# Patient Record
Sex: Female | Born: 1990
Health system: Southern US, Community
[De-identification: ages and names within clinical notes are randomized; demographics above are authoritative.]

## PROBLEM LIST (undated history)

## (undated) ENCOUNTER — Inpatient Hospital Stay (HOSPITAL_COMMUNITY): Payer: Self-pay

## (undated) ENCOUNTER — Emergency Department (HOSPITAL_BASED_OUTPATIENT_CLINIC_OR_DEPARTMENT_OTHER): Admission: EM | Payer: 59 | Source: Home / Self Care

## (undated) DIAGNOSIS — G43909 Migraine, unspecified, not intractable, without status migrainosus: Secondary | ICD-10-CM

## (undated) DIAGNOSIS — R569 Unspecified convulsions: Secondary | ICD-10-CM

## (undated) DIAGNOSIS — I517 Cardiomegaly: Secondary | ICD-10-CM

## (undated) DIAGNOSIS — Z8619 Personal history of other infectious and parasitic diseases: Secondary | ICD-10-CM

## (undated) DIAGNOSIS — D649 Anemia, unspecified: Secondary | ICD-10-CM

## (undated) HISTORY — DX: Morbid (severe) obesity due to excess calories: E66.01

## (undated) HISTORY — DX: Personal history of other infectious and parasitic diseases: Z86.19

## (undated) HISTORY — DX: Unspecified convulsions: R56.9

## (undated) HISTORY — DX: Anemia, unspecified: D64.9

---
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
## 2012-01-03 ENCOUNTER — Encounter: Payer: Self-pay | Admitting: Obstetrics and Gynecology

## 2012-01-03 ENCOUNTER — Ambulatory Visit (INDEPENDENT_AMBULATORY_CARE_PROVIDER_SITE_OTHER): Payer: BC Managed Care – PPO | Admitting: Obstetrics and Gynecology

## 2012-01-03 VITALS — BP 120/78 | Resp 20 | Wt 241.0 lb

## 2012-01-03 DIAGNOSIS — Z30017 Encounter for initial prescription of implantable subdermal contraceptive: Secondary | ICD-10-CM

## 2012-01-03 DIAGNOSIS — Z309 Encounter for contraceptive management, unspecified: Secondary | ICD-10-CM

## 2012-01-03 MED ORDER — ETONOGESTREL 68 MG ~~LOC~~ IMPL
68.0000 mg | DRUG_IMPLANT | Freq: Once | SUBCUTANEOUS | Status: AC
  Administered 2012-01-03: 68 mg via SUBCUTANEOUS

## 2012-01-03 NOTE — Progress Notes (Signed)
[address]: Contraception: Nexplanon. UPT: Neg  Insertion Procedure:  Site identified on Right Arm Skin Prep with alcohol. Local anesthesia: Lidocaine 1% infiltrated to insertion site and insertion track of Nexplanon. When skin anesthestized, skin prepped with Betadine swabs x 3. Nexplanon inserted as to manufactures recommendations. Nexplanon rod checked as being present under the skin - palpable Adhesive bandage to insertion site and pressure bandage applied to Right Arm. Patient educated on the location of the device and patient was able to feel the rod.  Nexplanon insertion completed.  Patient has been given advice about menses and cycles with this type of hormonal birth.control. Advised that arm my bruise from the insertion of this device and to leave the pressure bandage on her arm for 24 hrs. Advised that she can return for assist and advise at anytime.  Earl Gala, CNM.

## 2012-01-03 NOTE — Progress Notes (Signed)
LMP: 12/31/11 INSERTION DATE: 01/03/12 REMOVAL DATE: 01/03/15 INSERTION ARM: R arm PALPATED AFTER INSERT: yes IS PT SWITCHING FROM HORMONAL BC: no

## 2012-03-20 ENCOUNTER — Observation Stay (HOSPITAL_COMMUNITY)
Admission: EM | Admit: 2012-03-20 | Discharge: 2012-03-21 | Disposition: A | Discharge status: Home / Self Care | Payer: BC Managed Care – PPO | Attending: General Surgery | Admitting: General Surgery

## 2012-03-20 ENCOUNTER — Encounter (HOSPITAL_COMMUNITY)
Admission: EM | Disposition: A | Discharge status: Home / Self Care | Payer: Self-pay | Source: Home / Self Care | Attending: Emergency Medicine

## 2012-03-20 ENCOUNTER — Emergency Department (HOSPITAL_COMMUNITY): Payer: BC Managed Care – PPO

## 2012-03-20 ENCOUNTER — Observation Stay (HOSPITAL_COMMUNITY): Payer: BC Managed Care – PPO | Admitting: Certified Registered Nurse Anesthetist

## 2012-03-20 ENCOUNTER — Encounter (HOSPITAL_COMMUNITY): Payer: Self-pay | Admitting: Certified Registered Nurse Anesthetist

## 2012-03-20 ENCOUNTER — Encounter (HOSPITAL_COMMUNITY): Payer: Self-pay | Admitting: *Deleted

## 2012-03-20 DIAGNOSIS — D649 Anemia, unspecified: Secondary | ICD-10-CM | POA: Insufficient documentation

## 2012-03-20 DIAGNOSIS — K8000 Calculus of gallbladder with acute cholecystitis without obstruction: Principal | ICD-10-CM | POA: Insufficient documentation

## 2012-03-20 DIAGNOSIS — K8050 Calculus of bile duct without cholangitis or cholecystitis without obstruction: Secondary | ICD-10-CM

## 2012-03-20 DIAGNOSIS — J45909 Unspecified asthma, uncomplicated: Secondary | ICD-10-CM | POA: Insufficient documentation

## 2012-03-20 DIAGNOSIS — K8010 Calculus of gallbladder with chronic cholecystitis without obstruction: Secondary | ICD-10-CM

## 2012-03-20 DIAGNOSIS — Z79899 Other long term (current) drug therapy: Secondary | ICD-10-CM | POA: Insufficient documentation

## 2012-03-20 HISTORY — PX: CHOLECYSTECTOMY: SHX55

## 2012-03-20 LAB — COMPREHENSIVE METABOLIC PANEL
ALT: 14 U/L (ref 0–35)
AST: 19 U/L (ref 0–37)
Alkaline Phosphatase: 95 U/L (ref 39–117)
CO2: 23 mEq/L (ref 19–32)
Chloride: 100 mEq/L (ref 96–112)
Creatinine, Ser: 0.62 mg/dL (ref 0.50–1.10)
GFR calc non Af Amer: >90 mL/min (ref >90)
Potassium: 3.9 mEq/L (ref 3.5–5.1)
Total Bilirubin: 0.1 mg/dL — ABNORMAL LOW (ref 0.3–1.2)

## 2012-03-20 LAB — CBC WITH DIFFERENTIAL/PLATELET
Basophils Absolute: 0 10*3/uL (ref 0.0–0.1)
Basophils Relative: 1 % (ref 0–1)
Eosinophils Absolute: 0.3 10*3/uL (ref 0.0–0.7)
Eosinophils Relative: 3 % (ref 0–5)
HCT: 36.4 % (ref 36.0–46.0)
MCH: 24.3 pg — ABNORMAL LOW (ref 26.0–34.0)
MCHC: 31.9 g/dL (ref 30.0–36.0)
MCV: 76.3 fL — ABNORMAL LOW (ref 78.0–100.0)
Monocytes Absolute: 0.5 10*3/uL (ref 0.1–1.0)
Platelets: 316 10*3/uL (ref 150–400)
RDW: 14.6 % (ref 11.5–15.5)
WBC: 8.8 10*3/uL (ref 4.0–10.5)

## 2012-03-20 LAB — PREGNANCY, URINE: Preg Test, Ur: NEGATIVE

## 2012-03-20 LAB — URINALYSIS, ROUTINE W REFLEX MICROSCOPIC
Leukocytes, UA: NEGATIVE
Nitrite: NEGATIVE
Specific Gravity, Urine: 1.028 (ref 1.005–1.030)
Urobilinogen, UA: 1 mg/dL (ref 0.0–1.0)

## 2012-03-20 LAB — SURGICAL PCR SCREEN: MRSA, PCR: NEGATIVE

## 2012-03-20 IMAGING — US US ABDOMEN COMPLETE
1 series · 14 of 25 positions shown · non-contrast
Comparison: None.

CLINICAL DATA: Right upper quadrant pain.

COMPLETE ABDOMINAL ULTRASOUND

[Series 1: us abdomen complete · 0.34mm/px · 14 of 56 slices shown]
[im 1/56]
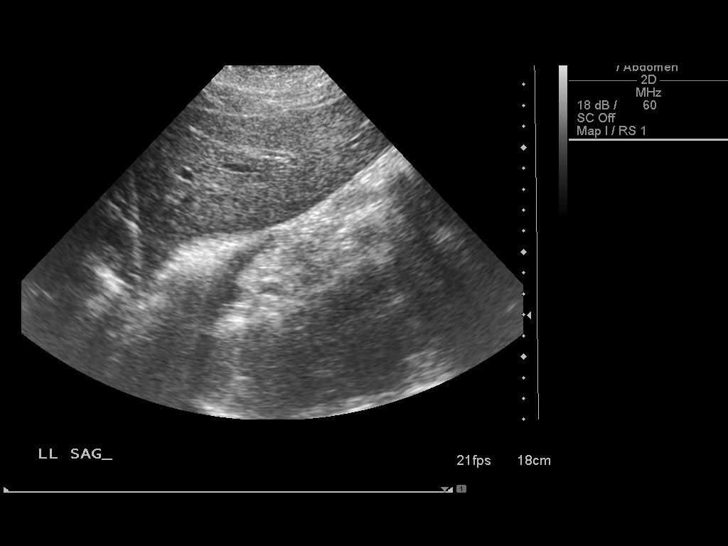
[im 5/56]
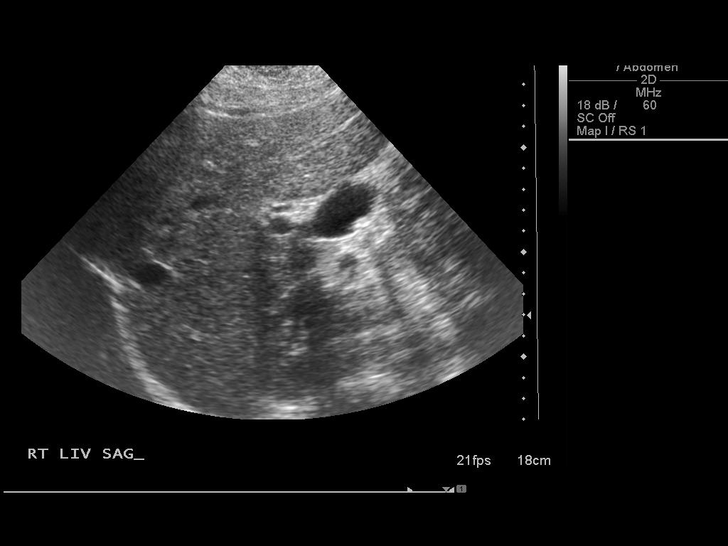
[im 10/56]
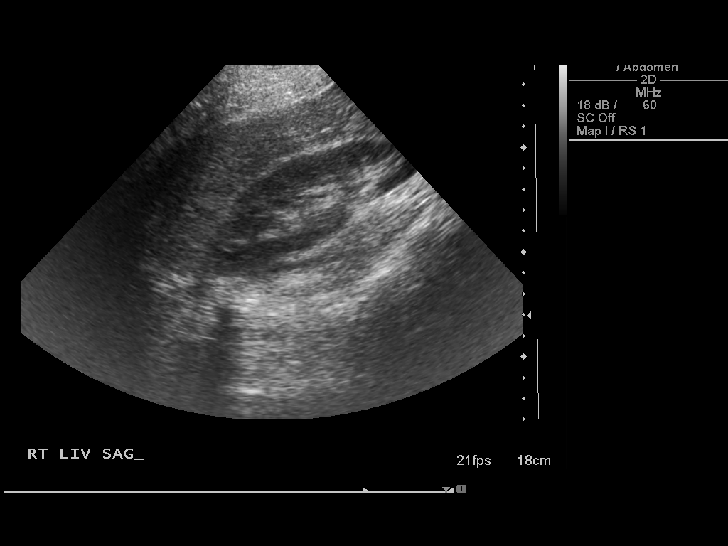
[im 14/56]
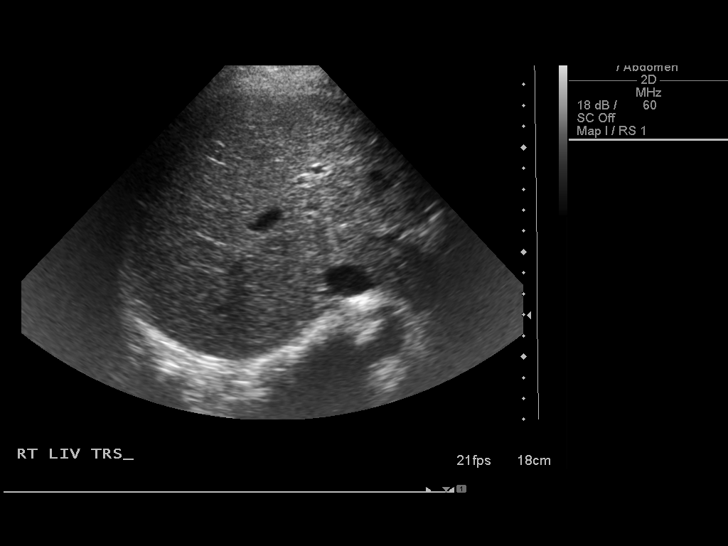
[im 19/56]
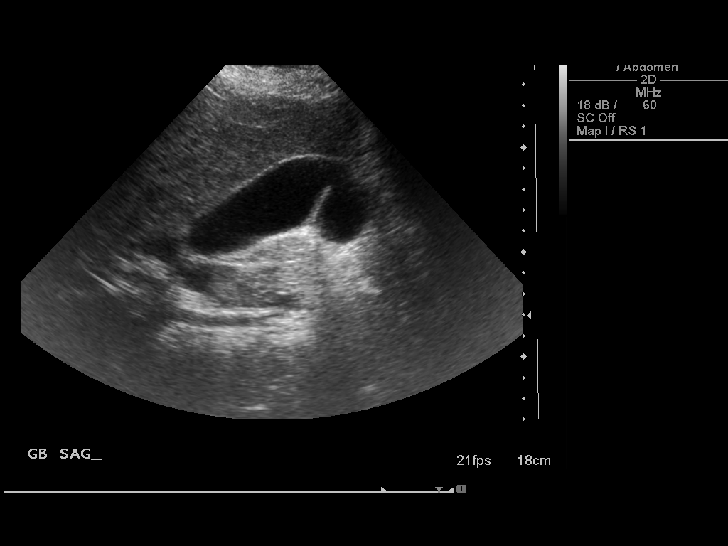
[im 21/56]
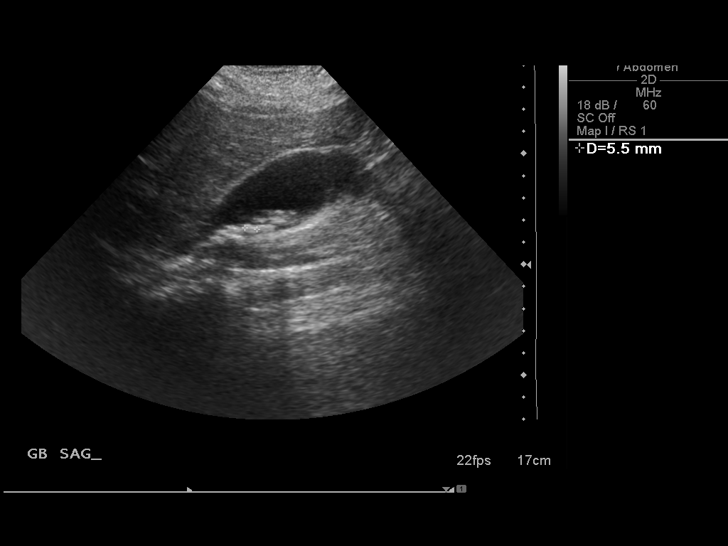
[im 26/56]
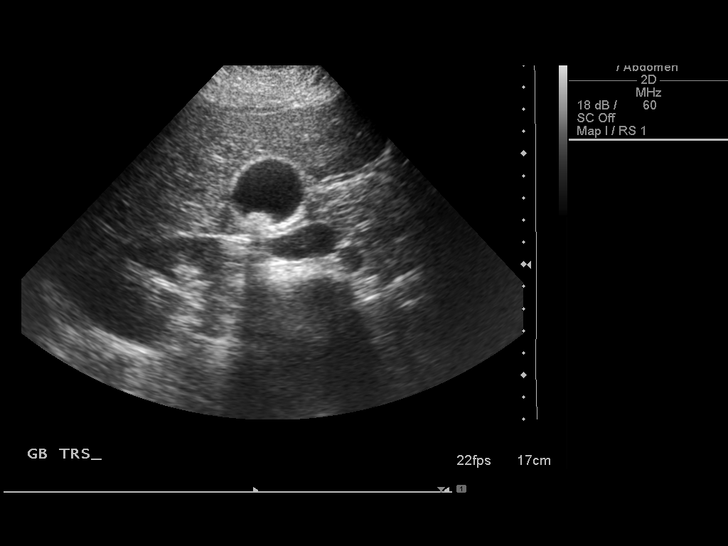
[im 30/56]
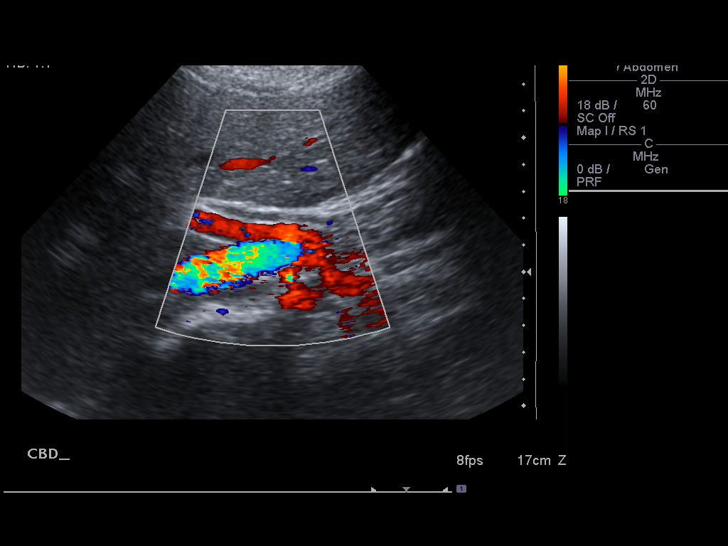
[im 35/56]
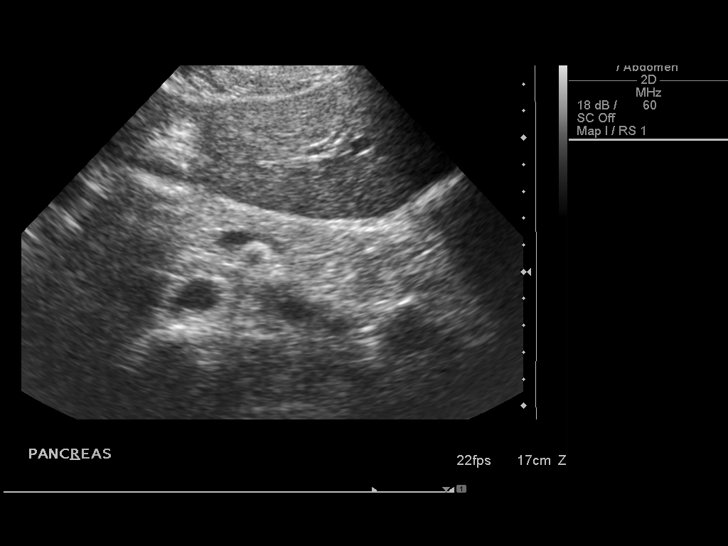
[im 37/56]
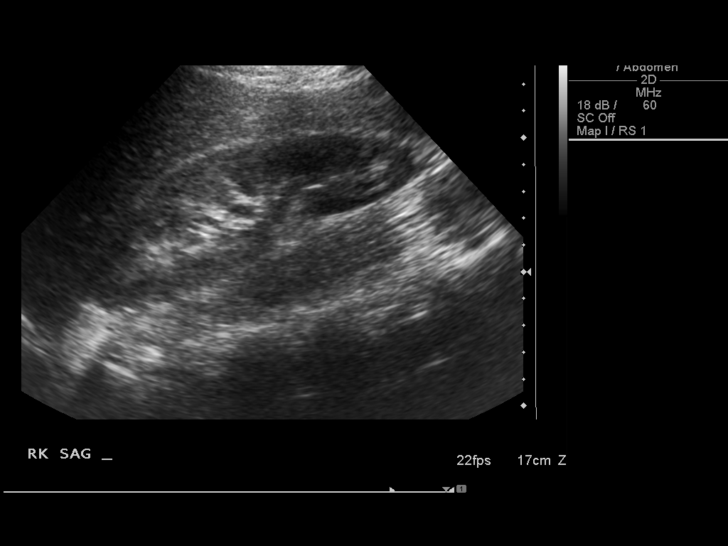
[im 42/56]
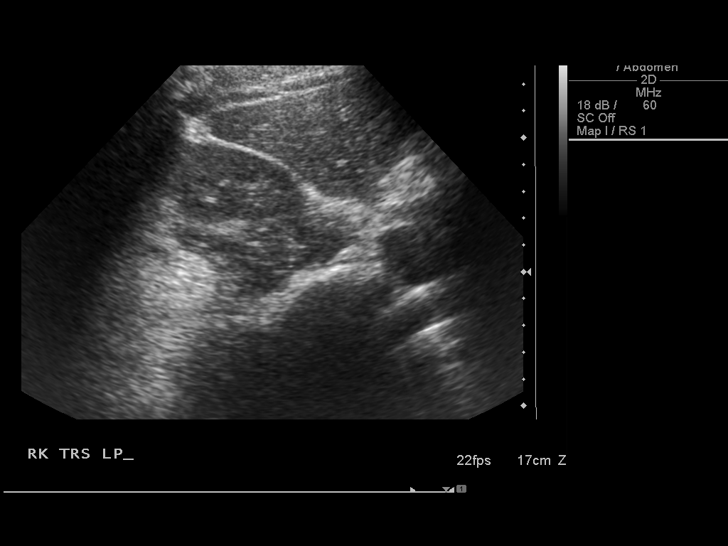
[im 46/56]
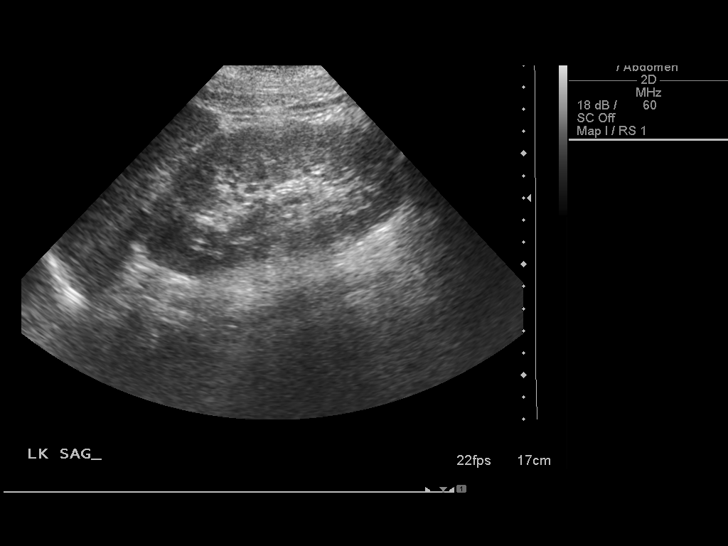
[im 51/56]
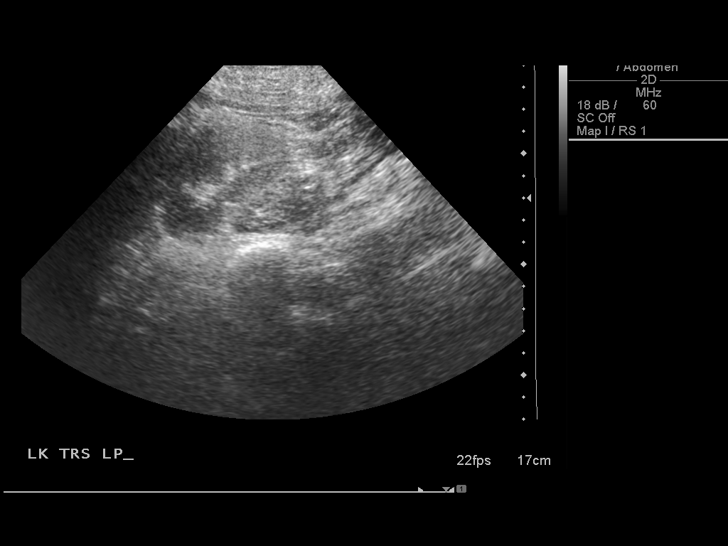
[im 56/56]
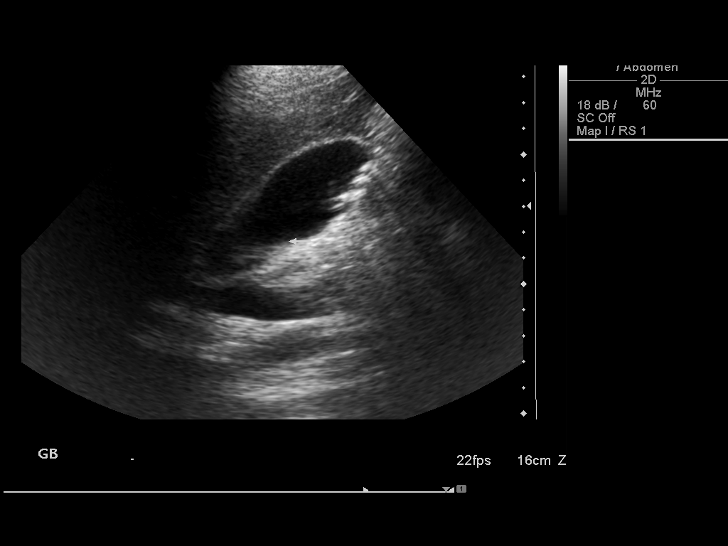

[14 of 25 positions shown; findings below may reference images not displayed]

FINDINGS: Gallbladder:  There are multiple mobile gallstones.  The largest
measures 6 mm.  There is a 3 mm nonmobile stone in the gallbladder
neck.  No wall thickening.  The patient was tender over the
gallbladder during the study.

Common bile duct:   Normal caliber, 4 mm.

Liver:  No focal lesion identified.  Within normal limits in
parenchymal echogenicity.

IVC:  Appears normal.

Pancreas:  No focal abnormality seen.

Spleen:  Within normal limits in size and echotexture.

Right Kidney:   Normal in size and parenchymal echogenicity.  No
evidence of mass or hydronephrosis.

Left Kidney:  Normal in size and parenchymal echogenicity.  No
evidence of mass or hydronephrosis.

Abdominal aorta:  No aneurysm identified.
IMPRESSION: Cholelithiasis.  3 mm nonmobile stone in the gallbladder neck.
Tenderness over the gallbladder.  No gallbladder wall thickening.

## 2012-03-20 SURGERY — LAPAROSCOPIC CHOLECYSTECTOMY
Anesthesia: General | Site: Abdomen | Wound class: Clean Contaminated

## 2012-03-20 MED ORDER — HYDROMORPHONE HCL PF 1 MG/ML IJ SOLN: 0.2500 mg | INTRAMUSCULAR | Status: DC | PRN

## 2012-03-20 MED ORDER — ACETAMINOPHEN 10 MG/ML IV SOLN
INTRAVENOUS | Status: AC
  Filled 2012-03-20: qty 100

## 2012-03-20 MED ORDER — IOHEXOL 300 MG/ML  SOLN
INTRAMUSCULAR | Status: AC
  Filled 2012-03-20: qty 1

## 2012-03-20 MED ORDER — ROCURONIUM BROMIDE 100 MG/10ML IV SOLN
INTRAVENOUS | Status: DC | PRN
  Administered 2012-03-20: 15 mg via INTRAVENOUS
  Administered 2012-03-20: 5 mg via INTRAVENOUS

## 2012-03-20 MED ORDER — FENTANYL CITRATE 0.05 MG/ML IJ SOLN
INTRAMUSCULAR | Status: DC | PRN
  Administered 2012-03-20: 50 ug via INTRAVENOUS
  Administered 2012-03-20 (×2): 100 ug via INTRAVENOUS

## 2012-03-20 MED ORDER — LACTATED RINGERS IV SOLN: INTRAVENOUS | Status: DC

## 2012-03-20 MED ORDER — BUPIVACAINE HCL (PF) 0.5 % IJ SOLN
INTRAMUSCULAR | Status: AC
  Filled 2012-03-20: qty 30

## 2012-03-20 MED ORDER — HYDROMORPHONE HCL PF 1 MG/ML IJ SOLN
0.5000 mg | INTRAMUSCULAR | Status: DC | PRN
  Administered 2012-03-20: 1.5 mg via INTRAVENOUS
  Filled 2012-03-20: qty 2
  Filled 2012-03-20: qty 1

## 2012-03-20 MED ORDER — HYDROCODONE-ACETAMINOPHEN 5-325 MG PO TABS
1.0000 | ORAL_TABLET | ORAL | Status: DC | PRN
  Administered 2012-03-20 (×2): 1 via ORAL
  Administered 2012-03-21: 2 via ORAL
  Filled 2012-03-20 (×2): qty 1
  Filled 2012-03-20: qty 2

## 2012-03-20 MED ORDER — RINGERS IRRIGATION IR SOLN
Status: DC | PRN
  Administered 2012-03-20: 1000 mL

## 2012-03-20 MED ORDER — MIDAZOLAM HCL 5 MG/5ML IJ SOLN
INTRAMUSCULAR | Status: DC | PRN
  Administered 2012-03-20: 2 mg via INTRAVENOUS

## 2012-03-20 MED ORDER — DEXAMETHASONE SODIUM PHOSPHATE 10 MG/ML IJ SOLN
INTRAMUSCULAR | Status: DC | PRN
  Administered 2012-03-20: 10 mg via INTRAVENOUS

## 2012-03-20 MED ORDER — LIDOCAINE HCL (CARDIAC) 20 MG/ML IV SOLN
INTRAVENOUS | Status: DC | PRN
  Administered 2012-03-20: 50 mg via INTRAVENOUS

## 2012-03-20 MED ORDER — NEOSTIGMINE METHYLSULFATE 1 MG/ML IJ SOLN
INTRAMUSCULAR | Status: DC | PRN
  Administered 2012-03-20: 4 mg via INTRAVENOUS

## 2012-03-20 MED ORDER — FENTANYL CITRATE 0.05 MG/ML IJ SOLN
50.0000 ug | Freq: Once | INTRAMUSCULAR | Status: AC
  Administered 2012-03-20: 50 ug via INTRAVENOUS

## 2012-03-20 MED ORDER — SUCCINYLCHOLINE CHLORIDE 20 MG/ML IJ SOLN
INTRAMUSCULAR | Status: DC | PRN
  Administered 2012-03-20: 100 mg via INTRAVENOUS

## 2012-03-20 MED ORDER — CIPROFLOXACIN IN D5W 400 MG/200ML IV SOLN
400.0000 mg | Freq: Two times a day (BID) | INTRAVENOUS | Status: DC
  Administered 2012-03-20 – 2012-03-21 (×3): 400 mg via INTRAVENOUS
  Filled 2012-03-20 (×3): qty 200

## 2012-03-20 MED ORDER — HYDROMORPHONE HCL PF 1 MG/ML IJ SOLN
1.0000 mg | Freq: Once | INTRAMUSCULAR | Status: AC
  Administered 2012-03-20: 1 mg via INTRAVENOUS
  Filled 2012-03-20: qty 1

## 2012-03-20 MED ORDER — KCL IN DEXTROSE-NACL 20-5-0.45 MEQ/L-%-% IV SOLN
INTRAVENOUS | Status: DC
  Administered 2012-03-20 – 2012-03-21 (×3): via INTRAVENOUS
  Filled 2012-03-20 (×4): qty 1000

## 2012-03-20 MED ORDER — FENTANYL CITRATE 0.05 MG/ML IJ SOLN
100.0000 ug | Freq: Once | INTRAMUSCULAR | Status: AC
  Administered 2012-03-20: 100 ug via INTRAVENOUS
  Filled 2012-03-20: qty 2

## 2012-03-20 MED ORDER — PROPOFOL 10 MG/ML IV BOLUS
INTRAVENOUS | Status: DC | PRN
  Administered 2012-03-20: 180 mg via INTRAVENOUS

## 2012-03-20 MED ORDER — ACETAMINOPHEN 10 MG/ML IV SOLN
INTRAVENOUS | Status: DC | PRN
  Administered 2012-03-20: 1000 mg via INTRAVENOUS

## 2012-03-20 MED ORDER — BUPIVACAINE HCL (PF) 0.5 % IJ SOLN
INTRAMUSCULAR | Status: DC | PRN
  Administered 2012-03-20: 20 mL

## 2012-03-20 MED ORDER — PROMETHAZINE HCL 25 MG/ML IJ SOLN: 6.2500 mg | INTRAMUSCULAR | Status: DC | PRN

## 2012-03-20 MED ORDER — GLYCOPYRROLATE 0.2 MG/ML IJ SOLN
INTRAMUSCULAR | Status: DC | PRN
  Administered 2012-03-20: 0.6 mg via INTRAVENOUS

## 2012-03-20 MED ORDER — ONDANSETRON HCL 4 MG/2ML IJ SOLN
4.0000 mg | Freq: Once | INTRAMUSCULAR | Status: AC
  Administered 2012-03-20: 4 mg via INTRAVENOUS
  Filled 2012-03-20: qty 2

## 2012-03-20 MED ORDER — LACTATED RINGERS IV SOLN
INTRAVENOUS | Status: DC | PRN
  Administered 2012-03-20: 11:00:00 via INTRAVENOUS

## 2012-03-20 MED ORDER — SODIUM CHLORIDE 0.9 % IV SOLN
Freq: Once | INTRAVENOUS | Status: AC
  Administered 2012-03-20: 04:00:00 via INTRAVENOUS

## 2012-03-20 MED ORDER — ONDANSETRON HCL 4 MG/2ML IJ SOLN
4.0000 mg | Freq: Four times a day (QID) | INTRAMUSCULAR | Status: DC | PRN
  Administered 2012-03-20: 4 mg via INTRAVENOUS
  Filled 2012-03-20: qty 2

## 2012-03-20 MED ORDER — FENTANYL CITRATE 0.05 MG/ML IJ SOLN
50.0000 ug | Freq: Once | INTRAMUSCULAR | Status: AC
  Administered 2012-03-20: 50 ug via INTRAVENOUS
  Filled 2012-03-20: qty 2

## 2012-03-20 MED ORDER — ONDANSETRON HCL 4 MG/2ML IJ SOLN
INTRAMUSCULAR | Status: DC | PRN
  Administered 2012-03-20: 4 mg via INTRAVENOUS

## 2012-03-20 MED ORDER — INFLUENZA VIRUS VACC SPLIT PF IM SUSP
0.5000 mL | INTRAMUSCULAR | Status: AC
  Administered 2012-03-21: 0.5 mL via INTRAMUSCULAR
  Filled 2012-03-20: qty 0.5

## 2012-03-20 SURGICAL SUPPLY — 35 items
APL SKNCLS STERI-STRIP NONHPOA (GAUZE/BANDAGES/DRESSINGS) ×2
APPLIER CLIP 5 13 M/L LIGAMAX5 (MISCELLANEOUS) ×3
APR CLP MED LRG 5 ANG JAW (MISCELLANEOUS) ×2
BAG SPEC RTRVL LRG 6X4 10 (ENDOMECHANICALS) ×2
BANDAGE ADHESIVE 1X3 (GAUZE/BANDAGES/DRESSINGS) ×12 IMPLANT
BENZOIN TINCTURE PRP APPL 2/3 (GAUZE/BANDAGES/DRESSINGS) ×3 IMPLANT
CANISTER SUCTION 2500CC (MISCELLANEOUS) ×3 IMPLANT
CHLORAPREP W/TINT 26ML (MISCELLANEOUS) ×3 IMPLANT
CLIP APPLIE 5 13 M/L LIGAMAX5 (MISCELLANEOUS) ×2 IMPLANT
CLOTH BEACON ORANGE TIMEOUT ST (SAFETY) ×3 IMPLANT
COVER MAYO STAND STRL (DRAPES) IMPLANT
DECANTER SPIKE VIAL GLASS SM (MISCELLANEOUS) ×1 IMPLANT
DRAPE C-ARM 42X72 X-RAY (DRAPES) IMPLANT
DRAPE LAPAROSCOPIC ABDOMINAL (DRAPES) ×3 IMPLANT
ELECT REM PT RETURN 9FT ADLT (ELECTROSURGICAL) ×3
ELECTRODE REM PT RTRN 9FT ADLT (ELECTROSURGICAL) ×2 IMPLANT
GLOVE BIOGEL PI IND STRL 7.0 (GLOVE) ×2 IMPLANT
GLOVE BIOGEL PI INDICATOR 7.0 (GLOVE) ×1
GLOVE SURG SIGNA 7.5 PF LTX (GLOVE) ×6 IMPLANT
GOWN STRL NON-REIN LRG LVL3 (GOWN DISPOSABLE) ×3 IMPLANT
GOWN STRL REIN XL XLG (GOWN DISPOSABLE) ×6 IMPLANT
HEMOSTAT SURGICEL 4X8 (HEMOSTASIS) IMPLANT
KIT BASIN OR (CUSTOM PROCEDURE TRAY) ×3 IMPLANT
NS IRRIG 1000ML POUR BTL (IV SOLUTION) ×2 IMPLANT
POUCH SPECIMEN RETRIEVAL 10MM (ENDOMECHANICALS) ×2 IMPLANT
SET CHOLANGIOGRAPH MIX (MISCELLANEOUS) IMPLANT
SET IRRIG TUBING LAPAROSCOPIC (IRRIGATION / IRRIGATOR) ×3 IMPLANT
SOLUTION ANTI FOG 6CC (MISCELLANEOUS) ×3 IMPLANT
STRIP CLOSURE SKIN 1/2X4 (GAUZE/BANDAGES/DRESSINGS) ×3 IMPLANT
SUT MNCRL AB 4-0 PS2 18 (SUTURE) ×3 IMPLANT
TOWEL OR 17X26 10 PK STRL BLUE (TOWEL DISPOSABLE) ×3 IMPLANT
TRAY LAP CHOLE (CUSTOM PROCEDURE TRAY) ×3 IMPLANT
TROCAR BLADELESS OPT 5 75 (ENDOMECHANICALS) ×9 IMPLANT
TROCAR XCEL BLUNT TIP 100MML (ENDOMECHANICALS) ×3 IMPLANT
TUBING INSUFFLATION 10FT LAP (TUBING) ×3 IMPLANT

## 2012-03-20 NOTE — ED Notes (Signed)
Pt states since 1 am c/o severe all over abd pain; vomited x 2; no diarrhea

## 2012-03-20 NOTE — Anesthesia Postprocedure Evaluation (Signed)
Anesthesia Post Note  Patient: Karen Simon  Procedure(s) Performed: Procedure(s) (LRB): LAPAROSCOPIC CHOLECYSTECTOMY (N/A)  Anesthesia type: General  Patient location: PACU  Post pain: Pain level controlled  Post assessment: Post-op Vital signs reviewed  Last Vitals:  Filed Vitals:   03/20/12 1245  BP: 149/71  Pulse: 61  Temp:   Resp: 12    Post vital signs: Reviewed  Level of consciousness: sedated  Complications: No apparent anesthesia complications

## 2012-03-20 NOTE — Anesthesia Preprocedure Evaluation (Addendum)
Anesthesia Evaluation  Patient identified by MRN, date of birth, ID band Patient awake    Reviewed: Allergy & Precautions, H&P , NPO status , Patient's Chart, lab work & pertinent test results  Airway Mallampati: II TM Distance: >3 FB Neck ROM: Full    Dental  (+) Teeth Intact and Dental Advisory Given,    Pulmonary asthma ,  breath sounds clear to auscultation  Pulmonary exam normal       Cardiovascular negative cardio ROS  Rhythm:Regular Rate:Normal     Neuro/Psych  Headaches, negative psych ROS   GI/Hepatic negative GI ROS, Neg liver ROS,   Endo/Other  Morbid obesity  Renal/GU negative Renal ROS  negative genitourinary   Musculoskeletal negative musculoskeletal ROS (+)   Abdominal   Peds negative pediatric ROS (+)  Hematology negative hematology ROS (+)   Anesthesia Other Findings   Reproductive/Obstetrics negative OB ROS                          Anesthesia Physical Anesthesia Plan  ASA: II  Anesthesia Plan: General   Post-op Pain Management:    Induction: Intravenous  Airway Management Planned: Oral ETT  Additional Equipment:   Intra-op Plan:   Post-operative Plan: Extubation in OR  Informed Consent: I have reviewed the patients History and Physical, chart, labs and discussed the procedure including the risks, benefits and alternatives for the proposed anesthesia with the patient or authorized representative who has indicated his/her understanding and acceptance.   Dental advisory given  Plan Discussed with: CRNA  Anesthesia Plan Comments:         Anesthesia Quick Evaluation

## 2012-03-20 NOTE — H&P (Signed)
I have seen and examined the patient and agree with the assessment and plans.  She has an impacted gallstone as well as a suspect chronic cholecystitis. I will proceed to the room for laparoscopic cholecystectomy. I discussed the risks which includes but is not limited to bleeding, infection, bile duct injury, bile leak, need to convert to an open procedure, injury to other structures, et Karie Soda. She understands and wishes to proceed. Likelihood of success is good.  Everardo Voris A. Magnus Ivan  MD, FACS

## 2012-03-20 NOTE — Op Note (Signed)
Laparoscopic Cholecystectomy Procedure Note  Indications: This patient presents with symptomatic gallbladder disease and will undergo laparoscopic cholecystectomy.  Pre-operative Diagnosis: Calculus of gallbladder with acute cholecystitis, without mention of obstruction  Post-operative Diagnosis: Same  Surgeon: Abigail Miyamoto A   Assistants: 0  Anesthesia: General endotracheal anesthesia  ASA Class: 1  Procedure Details  The patient was seen again in the Holding Room. The risks, benefits, complications, treatment options, and expected outcomes were discussed with the patient. The possibilities of reaction to medication, pulmonary aspiration, perforation of viscus, bleeding, recurrent infection, finding a normal gallbladder, the need for additional procedures, failure to diagnose a condition, the possible need to convert to an open procedure, and creating a complication requiring transfusion or operation were discussed with the patient. The likelihood of improving the patient's symptoms with return to their baseline status is good.  The patient and/or family concurred with the proposed plan, giving informed consent. The site of surgery properly noted. The patient was taken to Operating Room, identified as Karen Simon and the procedure verified as Laparoscopic Cholecystectomy with Intraoperative Cholangiogram. A Time Out was held and the above information confirmed.  Prior to the induction of general anesthesia, antibiotic prophylaxis was administered. General endotracheal anesthesia was then administered and tolerated well. After the induction, the abdomen was prepped with Chloraprep and draped in sterile fashion. The patient was positioned in the supine position.  Local anesthetic agent was injected into the skin near the umbilicus and an incision made. We dissected down to the abdominal fascia with blunt dissection.  The fascia was incised vertically and we entered the peritoneal  cavity bluntly.  A pursestring suture of 0-Vicryl was placed around the fascial opening.  The Hasson cannula was inserted and secured with the stay suture.  Pneumoperitoneum was then created with CO2 and tolerated well without any adverse changes in the patient's vital signs. An 11-mm port was placed in the subxiphoid position.  Two 5-mm ports were placed in the right upper quadrant. All skin incisions were infiltrated with a local anesthetic agent before making the incision and placing the trocars.   We positioned the patient in reverse Trendelenburg, tilted slightly to the patient's left.  The gallbladder was identified, the fundus grasped and retracted cephalad. Adhesions were lysed bluntly and with the electrocautery where indicated, taking care not to injure any adjacent organs or viscus. The infundibulum was grasped and retracted laterally, exposing the peritoneum overlying the triangle of Calot. This was then divided and exposed in a blunt fashion. The cystic duct was clearly identified and bluntly dissected circumferentially. A critical view of the cystic duct and cystic artery was obtained.  The cystic duct was then ligated with clips and divided. The cystic artery was, dissected free, ligated with clips and divided as well.   The gallbladder was dissected from the liver bed in retrograde fashion with the electrocautery. The gallbladder was removed and placed in an Endocatch sac. The liver bed was irrigated and inspected. Hemostasis was achieved with the electrocautery. Copious irrigation was utilized and was repeatedly aspirated until clear.  The gallbladder and Endocatch sac were then removed through the umbilical port site.  The pursestring suture was used to close the umbilical fascia.    We again inspected the right upper quadrant for hemostasis.  Pneumoperitoneum was released as we removed the trocars.  4-0 Monocryl was used to close the skin.   Benzoin, steri-strips, and clean dressings were  applied. The patient was then extubated and brought to the recovery  room in stable condition. Instrument, sponge, and needle counts were correct at closure and at the conclusion of the case.   Findings: Cholecystitis with Cholelithiasis  Estimated Blood Loss: Minimal         Drains: 0         Specimens: Gallbladder           Complications: None; patient tolerated the procedure well.         Disposition: PACU - hemodynamically stable.         Condition: stable

## 2012-03-20 NOTE — Transfer of Care (Signed)
Immediate Anesthesia Transfer of Care Note  Patient: Karen Simon  Procedure(s) Performed: Procedure(s) (LRB) with comments: LAPAROSCOPIC CHOLECYSTECTOMY (N/A)  Patient Location: PACU  Anesthesia Type:General  Level of Consciousness: awake and alert   Airway & Oxygen Therapy: Patient Spontanous Breathing and Patient connected to face mask oxygen  Post-op Assessment: Report given to PACU RN and Post -op Vital signs reviewed and stable  Post vital signs: Reviewed and stable  Complications: No apparent anesthesia complications

## 2012-03-20 NOTE — H&P (Signed)
Karen Simon is an 21 y.o. female.   Primary care: Dr. Evelena Peat Chief Complaint: Abdominal pain nausea and vomiting HPI: Patient is a 21 year old female who is 3 months postpartum. This is her third episode of right upper quadrant pain. The first was approximately 3 months ago it lasted an hour. She had some nausea with it. She's had one other episode prior to today. Both episodes lasted approximately 1 hour. This morning she awoke with abdominal pain and developed nausea and vomiting around 1 AM. The pain has persisted. She presented to the emergency room. Labs shows a normal WBC of 8800. LFTs and lipase are normal. Abdominal ultrasound shows multiple gallstones are mobile the largest is 6 mm. There is a 3 mm nonmobile stone in the gallbladder neck there is no thickening of the gallbladder wall, patient was tender over the gallbladder during the study. Common bile duct was 4 mm, there were no other abnormalities on this study. We were asked to the patient in consultation for cholecystitis/cholelithiasis.  Past Medical History  Diagnosis Date  . Anemia   . Asthma/ she has a rescue inhaler   . H/O varicella   . H/O candidiasis; oral   . Irregular periods/menstrual cycles; now with implanted [facility] 07/01/09    Past Surgical History  Procedure Date  . No past surgeries     Family History  Problem Relation Age of Onset  . Hypertension Dyslipidemia Good health Mother Father Brother   . Cancer Maternal Grandmother     breast  . Cancer Maternal Grandfather     colon  . Anesthesia problems Neg Hx    Social History:  reports that she has never smoked. She has never used smokeless tobacco. She reports that she does not drink alcohol or use illicit drugs.  Allergies:  Allergies  Allergen Reactions  . Iodine Hives they found this while painting with latex gloves.  . Latex Hives  . Shellfish Allergy Hives  . Tomato Hives and Swelling   Prior to Admission medications     Medication Sig Start Date End Date Taking? Authorizing Provider  etonogestrel (IMPLANON) 68 MG IMPL implant Inject 1 each into the skin once.   Yes Historical Provider, MD  OVER THE COUNTER MEDICATION  Iron supplement Take 1 capsule by mouth daily. Otc. Hydroxycut   Yes Historical Provider, MD     (Not in a hospital admission)  Results for orders placed during the hospital encounter of 03/20/12 (from the past 48 hour(s))  CBC WITH DIFFERENTIAL     Status: Abnormal   Collection Time   03/20/12  3:55 AM      Component Value Range Comment   WBC 8.8  4.0 - 10.5 K/uL    RBC 4.77  3.87 - 5.11 MIL/uL    Hemoglobin 11.6 (*) 12.0 - 15.0 g/dL    HCT 40.9  81.1 - 91.4 %    MCV 76.3 (*) 78.0 - 100.0 fL    MCH 24.3 (*) 26.0 - 34.0 pg    MCHC 31.9  30.0 - 36.0 g/dL    RDW 78.2  95.6 - 21.3 %    Platelets 316  150 - 400 K/uL    Neutrophils Relative 60  43 - 77 %    Neutro Abs 5.3  1.7 - 7.7 K/uL    Lymphocytes Relative 31  12 - 46 %    Lymphs Abs 2.7  0.7 - 4.0 K/uL    Monocytes Relative 5  3 - 12 %  Monocytes Absolute 0.5  0.1 - 1.0 K/uL    Eosinophils Relative 3  0 - 5 %    Eosinophils Absolute 0.3  0.0 - 0.7 K/uL    Basophils Relative 1  0 - 1 %    Basophils Absolute 0.0  0.0 - 0.1 K/uL   COMPREHENSIVE METABOLIC PANEL     Status: Abnormal   Collection Time   03/20/12  3:55 AM      Component Value Range Comment   Sodium 136  135 - 145 mEq/L    Potassium 3.9  3.5 - 5.1 mEq/L    Chloride 100  96 - 112 mEq/L    CO2 23  19 - 32 mEq/L    Glucose, Bld 125 (*) 70 - 99 mg/dL    BUN 12  6 - 23 mg/dL    Creatinine, Ser 1.19  0.50 - 1.10 mg/dL    Calcium 9.6  8.4 - 14.7 mg/dL    Total Protein 8.1  6.0 - 8.3 g/dL    Albumin 3.7  3.5 - 5.2 g/dL    AST 19  0 - 37 U/L    ALT 14  0 - 35 U/L    Alkaline Phosphatase 95  39 - 117 U/L    Total Bilirubin 0.1 (*) 0.3 - 1.2 mg/dL    GFR calc non Af Amer >90  >90 mL/min    GFR calc Af Amer >90  >90 mL/min   LIPASE, BLOOD     Status: Normal    Collection Time   03/20/12  3:55 AM      Component Value Range Comment   Lipase 35  11 - 59 U/L   URINALYSIS, ROUTINE W REFLEX MICROSCOPIC     Status: Normal   Collection Time   03/20/12  7:15 AM      Component Value Range Comment   Color, Urine YELLOW  YELLOW    APPearance CLEAR  CLEAR    Specific Gravity, Urine 1.028  1.005 - 1.030    pH 6.0  5.0 - 8.0    Glucose, UA NEGATIVE  NEGATIVE mg/dL    Hgb urine dipstick NEGATIVE  NEGATIVE    Bilirubin Urine NEGATIVE  NEGATIVE    Ketones, ur NEGATIVE  NEGATIVE mg/dL    Protein, ur NEGATIVE  NEGATIVE mg/dL    Urobilinogen, UA 1.0  0.0 - 1.0 mg/dL    Nitrite NEGATIVE  NEGATIVE    Leukocytes, UA NEGATIVE  NEGATIVE MICROSCOPIC NOT DONE ON URINES WITH NEGATIVE PROTEIN, BLOOD, LEUKOCYTES, NITRITE, OR GLUCOSE <1000 mg/dL.  PREGNANCY, URINE     Status: Normal   Collection Time   03/20/12  7:15 AM      Component Value Range Comment   Preg Test, Ur NEGATIVE  NEGATIVE    US Abdomen Complete  03/20/2012  *RADIOLOGY REPORT*  Clinical Data:  Right upper quadrant pain.  COMPLETE ABDOMINAL ULTRASOUND  Comparison:  None.  Findings:  Gallbladder:  There are multiple mobile gallstones.  The largest measures 6 mm.  There is a 3 mm nonmobile stone in the gallbladder neck.  No wall thickening.  The patient was tender over the gallbladder during the study.  Common bile duct:   Normal caliber, 4 mm.  Liver:  No focal lesion identified.  Within normal limits in parenchymal echogenicity.  IVC:  Appears normal.  Pancreas:  No focal abnormality seen.  Spleen:  Within normal limits in size and echotexture.  Right Kidney:   Normal in size and  parenchymal echogenicity.  No evidence of mass or hydronephrosis.  Left Kidney:  Normal in size and parenchymal echogenicity.  No evidence of mass or hydronephrosis.  Abdominal aorta:  No aneurysm identified.  IMPRESSION: Cholelithiasis.  3 mm nonmobile stone in the gallbladder neck. Tenderness over the gallbladder.  No gallbladder  wall thickening.   Original Report Authenticated By: Charlett Nose, M.D.     Review of Systems  Constitutional: Negative for fever, chills, weight loss, malaise/fatigue and diaphoresis.  HENT: Negative.   Eyes: Negative.   Respiratory: Negative.        Asthma most bothersome in winter.  Has a rescue inhaler   Cardiovascular: Negative.   Gastrointestinal: Positive for nausea, vomiting and abdominal pain. Negative for heartburn, diarrhea, constipation, blood in stool and melena.  Genitourinary:       Has implanted BCP   Musculoskeletal:       She has back pain with abdominal pain, same side.  Skin:       HX OF eczema    Neurological: Negative.  Negative for weakness.  Endo/Heme/Allergies: Negative.   Psychiatric/Behavioral: The patient is nervous/anxious.     Blood pressure 112/63, pulse 73, temperature 98.7 F (37.1 C), resp. rate 22, SpO2 99.00%, unknown if currently breastfeeding. Physical Exam  Constitutional: She is oriented to person, place, and time. She appears well-developed. No distress.       BMI 39.6, 3 months post partum, normal vaginal delivery. Sleepy when I came in.  HENT:  Head: Normocephalic and atraumatic.  Nose: Nose normal.  Eyes: Conjunctivae normal and EOM are normal. Pupils are equal, round, and reactive to light. Right eye exhibits no discharge. Left eye exhibits no discharge. No scleral icterus.  Neck: Normal range of motion. Neck supple. No JVD present. No tracheal deviation present. No thyromegaly present.  Cardiovascular: Normal rate, regular rhythm, normal heart sounds and intact distal pulses.  Exam reveals no gallop.   No murmur heard. Respiratory: Effort normal and breath sounds normal. No stridor. No respiratory distress. She has no wheezes. She has no rales. She exhibits no tenderness.  GI: Soft. Bowel sounds are normal. She exhibits no distension and no mass. There is tenderness (pain is all in RUQ). There is no rebound and no guarding.    Musculoskeletal: Normal range of motion. She exhibits no edema and no tenderness.  Lymphadenopathy:    She has no cervical adenopathy.  Neurological: She is alert and oriented to person, place, and time. No cranial nerve deficit.  Skin: Skin is warm and dry. No rash noted. She is not diaphoretic. No erythema.  Psychiatric: She has a normal mood and affect. Her behavior is normal. Judgment and thought content normal.     Assessment/Plan 1. Cholelithiasis/cholecystitis 2.3 months post partum 3.Hx of asthma 4.Hx of anemia 5.BMI 39.6  Plan:  NPO, hydrate, Cipro, cholecystectomy with IOC. Will Marlyne Beards PA-C for Dr. Abigail Miyamoto  Sarahelizabeth Conway 03/20/2012, 8:12 AM

## 2012-03-20 NOTE — ED Provider Notes (Addendum)
History     CSN: [id number]  Arrival date & time 03/20/12  4098   None     Chief Complaint  Patient presents with  . Abdominal Pain    (Consider location/radiation/quality/duration/timing/severity/associated sxs/prior treatment) HPI This is a 21 year old female with a sudden onset of right upper quadrant abdominal tenderness beginning at 1 AM. The onset was sudden. It is worse with palpation or movement. She describes it as moderate to severe. She is unable to characterize the pain. She has had episodes of the past but has never sought medical care for her. She has had some nausea and vomiting with it but no diarrhea. She denies vaginal bleeding or discharge. She states she does not have regular periods due to her birth control.  Past Medical History  Diagnosis Date  . Anemia   . Asthma   . H/O varicella   . H/O candidiasis   . Irregular periods/menstrual cycles 07/01/09    Past Surgical History  Procedure Date  . No past surgeries     Family History  Problem Relation Age of Onset  . Hypertension Mother   . Cancer Maternal Grandmother     breast  . Cancer Maternal Grandfather     colon  . Anesthesia problems Neg Hx     History  Substance Use Topics  . Smoking status: Never Smoker   . Smokeless tobacco: Never Used  . Alcohol Use: No    OB History    Grav Para Term Preterm Abortions TAB SAB Ect Mult Living   1 1 1  0 0 0 0 0 0 1      Review of Systems  All other systems reviewed and are negative.    Allergies  Iodine; Latex; Shellfish allergy; and Tomato  Home Medications   Current Outpatient Rx  Name  Route  Sig  Dispense  Refill  . FERRALET 90 90-1 MG PO TABS   Oral   Take 1 capsule by mouth daily.   30 each   11   . OVER THE COUNTER MEDICATION   Oral   Take by mouth daily.           BP 134/61  Pulse 81  Temp 97.6 F (36.4 C)  Resp 20  SpO2 100%  Breastfeeding? Unknown  Physical Exam General: Well-developed, well-nourished  female in no acute distress; appearance consistent with age of record HENT: normocephalic, atraumatic Eyes: pupils equal round and reactive to light; extraocular muscles intact Neck: supple Heart: regular rate and rhythm Lungs: clear to auscultation bilaterally Abdomen: soft; nondistended; right upper quadrant tenderness; bowel sounds present Extremities: No deformity; full range of motion; pulses normal; no edema Neurologic: Awake, alert and oriented; motor function intact in all extremities and symmetric; no facial droop Skin: Warm and dry Psychiatric: Normal mood and affect    ED Course  Procedures (including critical care time)     MDM   Nursing notes and vitals signs, including pulse oximetry, reviewed.  Summary of this visit's results, reviewed by myself:  Labs:  Results for orders placed during the hospital encounter of 03/20/12  CBC WITH DIFFERENTIAL      Component Value Range   WBC 8.8  4.0 - 10.5 K/uL   RBC 4.77  3.87 - 5.11 MIL/uL   Hemoglobin 11.6 (*) 12.0 - 15.0 g/dL   HCT 11.9  14.7 - 82.9 %   MCV 76.3 (*) 78.0 - 100.0 fL   MCH 24.3 (*) 26.0 - 34.0 pg   MCHC  31.9  30.0 - 36.0 g/dL   RDW 14.7  82.9 - 56.2 %   Platelets 316  150 - 400 K/uL   Neutrophils Relative 60  43 - 77 %   Neutro Abs 5.3  1.7 - 7.7 K/uL   Lymphocytes Relative 31  12 - 46 %   Lymphs Abs 2.7  0.7 - 4.0 K/uL   Monocytes Relative 5  3 - 12 %   Monocytes Absolute 0.5  0.1 - 1.0 K/uL   Eosinophils Relative 3  0 - 5 %   Eosinophils Absolute 0.3  0.0 - 0.7 K/uL   Basophils Relative 1  0 - 1 %   Basophils Absolute 0.0  0.0 - 0.1 K/uL  COMPREHENSIVE METABOLIC PANEL      Component Value Range   Sodium 136  135 - 145 mEq/L   Potassium 3.9  3.5 - 5.1 mEq/L   Chloride 100  96 - 112 mEq/L   CO2 23  19 - 32 mEq/L   Glucose, Bld 125 (*) 70 - 99 mg/dL   BUN 12  6 - 23 mg/dL   Creatinine, Ser 1.30  0.50 - 1.10 mg/dL   Calcium 9.6  8.4 - 86.5 mg/dL   Total Protein 8.1  6.0 - 8.3 g/dL   Albumin  3.7  3.5 - 5.2 g/dL   AST 19  0 - 37 U/L   ALT 14  0 - 35 U/L   Alkaline Phosphatase 95  39 - 117 U/L   Total Bilirubin 0.1 (*) 0.3 - 1.2 mg/dL   GFR calc non Af Amer >90  >90 mL/min   GFR calc Af Amer >90  >90 mL/min  LIPASE, BLOOD      Component Value Range   Lipase 35  11 - 59 U/L    Imaging Studies: US Abdomen Complete  03/20/2012  *RADIOLOGY REPORT*  Clinical Data:  Right upper quadrant pain.  COMPLETE ABDOMINAL ULTRASOUND  Comparison:  None.  Findings:  Gallbladder:  There are multiple mobile gallstones.  The largest measures 6 mm.  There is a 3 mm nonmobile stone in the gallbladder neck.  No wall thickening.  The patient was tender over the gallbladder during the study.  Common bile duct:   Normal caliber, 4 mm.  Liver:  No focal lesion identified.  Within normal limits in parenchymal echogenicity.  IVC:  Appears normal.  Pancreas:  No focal abnormality seen.  Spleen:  Within normal limits in size and echotexture.  Right Kidney:   Normal in size and parenchymal echogenicity.  No evidence of mass or hydronephrosis.  Left Kidney:  Normal in size and parenchymal echogenicity.  No evidence of mass or hydronephrosis.  Abdominal aorta:  No aneurysm identified.  IMPRESSION: Cholelithiasis.  3 mm nonmobile stone in the gallbladder neck. Tenderness over the gallbladder.  No gallbladder wall thickening.   Original Report Authenticated By: Charlett Nose, M.D.    7:04 AM Patient continues to have significant pain and tenderness with positive Murphy sign. We'll consult general surgery for evaluation.  7:19 AM Dr. Terrace Arabia of general surgery will see patient.         Hanley Seamen, MD 03/20/12 7846  Hanley Seamen, MD 03/20/12 9629

## 2012-03-21 ENCOUNTER — Encounter (HOSPITAL_COMMUNITY): Payer: Self-pay | Admitting: Surgery

## 2012-03-21 MED ORDER — INFLUENZA VIRUS VACC SPLIT PF IM SUSP: 0.5000 mL | INTRAMUSCULAR | Status: DC

## 2012-03-21 MED ORDER — HYDROCODONE-ACETAMINOPHEN 5-325 MG PO TABS: 1.0000 | ORAL_TABLET | ORAL | Status: DC | PRN

## 2012-03-21 MED ORDER — TRAMADOL-ACETAMINOPHEN 37.5-325 MG PO TABS: 1.0000 | ORAL_TABLET | Freq: Four times a day (QID) | ORAL | Status: DC | PRN

## 2012-03-21 NOTE — Progress Notes (Signed)
Patient discharged home. Discharge instructions and medications reviewed with patient. No questions at this time.

## 2012-03-21 NOTE — Care Management Note (Signed)
    Page 1 of 1   03/21/2012     12:15:55 PM   CARE MANAGEMENT NOTE 03/21/2012  Patient:  Karen Simon, Karen Simon   Account Number:  [id number]  Date Initiated:  03/21/2012  Documentation initiated by:  Lorenda Ishihara  Subjective/Objective Assessment:   21 yo female admitted s/p lap chole     Action/Plan:   home when stable   Anticipated DC Date:  03/21/2012   Anticipated DC Plan:  HOME/SELF CARE      DC Planning Services  CM consult      Choice offered to / List presented to:             Status of service:  Completed, signed off Medicare Important Message given?   (If response is "NO", the following Medicare IM given date fields will be blank) Date Medicare IM given:   Date Additional Medicare IM given:    Discharge Disposition:  HOME/SELF CARE  Per UR Regulation:  Reviewed for med. necessity/level of care/duration of stay  If discussed at Long Length of Stay Meetings, dates discussed:    Comments:

## 2012-03-21 NOTE — Discharge Summary (Signed)
I have seen and examined the patient and agree with the assessment and plans.  Dung Salinger A. Ludie Pavlik  MD, FACS  

## 2012-03-21 NOTE — Discharge Summary (Signed)
Physician Discharge Summary  Patient ID: NYREE APPLEGATE MRN: [id number] DOB/AGE: 10-08-1990 21 y.o.  Admit date: 03/20/2012 Discharge date: 03/21/2012  Admission Diagnoses: Abdominal pain nausea and vomiting.  Discharge Diagnoses: Chronic cholecystitis  Active Problems:  * No active hospital problems. *    Discharged Condition: stable  Hospital Course:This is her third episode of right upper quadrant pain. The first was approximately 3 months ago it lasted an hour. She had some nausea with it. She's had one other episode prior to today. Both episodes lasted approximately 1 hour. This morning she awoke with abdominal pain and developed nausea and vomiting around 1 AM. The pain has persisted. She presented to the emergency room. Labs shows a normal WBC of 8800. LFTs and lipase are normal. Abdominal ultrasound shows multiple gallstones are mobile the largest is 6 mm. There is a 3 mm nonmobile stone in the gallbladder neck there is no thickening of the gallbladder wall, patient was tender over the gallbladder during the study. Common bile duct was 4 mm, there were no other abnormalities on this study. We were asked to the patient in consultation for cholecystitis/cholelithiasis. Patient underwent ;aporoscopic cholecystectomy on 03/20/12 by Dr. Magnus Ivan post op she has progressed well, has tolerated a diet, remained afebrile and hemodynamically stable. At this time she is stable for discharge to home/self care and will follow up with Dr. Magnus Ivan in 2 weeks time. She has been given written post op care instructions and these have been reviewed with the patient and she has verbalized understanding of same. She has also been given our phone number should she need to contact us prior to her scheduled appointment.   Consults: None  Significant Diagnostic Studies: labs, microbiology, and radiology.  Treatments: IV hydration, antibiotics, analgesia, respiratory therapy, and surgery.  Discharge  Exam: Blood pressure 122/68, pulse 66, temperature 98.3 F (36.8 C), temperature source Oral, resp. rate 18, height 5\' 5"  (1.651 m), weight 238 lb (107.956 kg), SpO2 98.00%, not currently breastfeeding. General appearance: alert, cooperative, appears stated age and no distress Chest: CTA Cardiac: RRR No M/R/G Abdomen: soft, minimally tender around surgical wound sites, no distention,+BS flatus, tolerating diet w/o N/V.  Disposition: 01-Home or Self Care  Discharge Orders    Future Orders Please Complete By Expires   Discharge patient      Comments:   To home /self care  Follow u[p appointment with Dr. Magnus Ivan as scheduled       Medication List     As of 03/21/2012  8:52 AM    TAKE these medications         HYDROcodone-acetaminophen 5-325 MG per tablet   Commonly known as: NORCO/VICODIN   Take 1-2 tablets by mouth every 4 (four) hours as needed.      IMPLANON 68 MG Impl implant   Generic drug: etonogestrel   Inject 1 each into the skin once.      influenza  inactive virus vaccine injection   Commonly known as: FLUZONE/FLUARIX   Inject 0.5 mLs into the muscle tomorrow at 10 am.      OVER THE COUNTER MEDICATION   Take 1 capsule by mouth daily. Otc. Hydroxycut      traMADol-acetaminophen 37.5-325 MG per tablet   Commonly known as: ULTRACET   Take 1 tablet by mouth every 6 (six) hours as needed for pain.           Follow-up Information    Follow up with [facility], MD. In 2 weeks. ( Please  arrive earlier than your appointment time for check in procedures.  thank you)    Contact information:   [address] [place] [address] [phone]          Signed: Blenda Mounts [facility] Pager # [phone]  03/21/2012, 8:52 AM

## 2012-10-19 ENCOUNTER — Telehealth: Payer: Self-pay | Admitting: Family Medicine

## 2012-10-19 NOTE — Telephone Encounter (Signed)
Patient Information:  Caller Name: Senaida  Phone: [phone]  Patient: Karen Simon, Karen Simon  Gender: Female  DOB: [birth year]  Age: 23 Years  PCP: Evelena Peat [facility])  Pregnant: No  Office Follow Up:  Does the office need to follow up with this patient?: No  Instructions For The Office: N/A  RN Note:  Pt to call insurance company for referral to OB/GYN. Discussed breakthrough bleeding being a common side effect whil on Nexplanon.  Symptoms  Reason For Call & Symptoms: Pt calling regarding Nexplanon questions. Breakthrough bleeding starting on 10/17/12 with clots-half the size of a golf ball. Able to wear a pad "a couple of hours". Denies any pain. Does not use OB/GYN that placed Nexplannon; will not go back or call them.  Reviewed Health History In EMR: Yes  Reviewed Medications In EMR: Yes  Reviewed Allergies In EMR: Yes  Reviewed Surgeries / Procedures: Yes  Date of Onset of Symptoms: 10/19/2012 OB / GYN:  LMP: 10/17/2012  Guideline(s) Used:  Vaginal Bleeding - Abnormal  Disposition Per Guideline:   See Within 3 Days in Office  Reason For Disposition Reached:   Periods with > 6 soaked pads or tampons per day  Advice Given:  Irregular bleeding and You are Using Implanon or DepoProvera:   Irregular bleeding is a common side effect. It may include heavier, lighter, more frequent, or less frequent bleeding than your normal periods.  Diary  : Keep a record of the days you have any bleeding or spotting.  Call Back If:  Irregular bleeding occurs more than 2 cycles (2 months)  Bleeding becomes worse  You become worse.  Patient Will Follow Care Advice:  YES

## 2013-05-09 NOTE — L&D Delivery Note (Signed)
Patient was C/C/0 and pushed for <10 minutes with epidural.   NSVD  female infant, Apgars 9/9, weight pending.   The patient had a small 1st degree laceration repaired with 3-0 vicryl. Fundus was firm. EBL was expected. Placenta was delivered intact. Vagina was clear.  Baby was vigorous and doing skin to skin with mother.  Philip AspenALLAHAN, Christiane Sistare

## 2013-05-18 ENCOUNTER — Encounter (HOSPITAL_COMMUNITY): Payer: Self-pay | Admitting: Emergency Medicine

## 2013-05-18 ENCOUNTER — Emergency Department (HOSPITAL_COMMUNITY)
Admission: EM | Admit: 2013-05-18 | Discharge: 2013-05-18 | Disposition: A | Discharge status: Home / Self Care | Payer: BC Managed Care – PPO | Attending: Emergency Medicine | Admitting: Emergency Medicine

## 2013-05-18 DIAGNOSIS — R110 Nausea: Secondary | ICD-10-CM | POA: Insufficient documentation

## 2013-05-18 DIAGNOSIS — J45909 Unspecified asthma, uncomplicated: Secondary | ICD-10-CM | POA: Insufficient documentation

## 2013-05-18 DIAGNOSIS — Z8742 Personal history of other diseases of the female genital tract: Secondary | ICD-10-CM | POA: Insufficient documentation

## 2013-05-18 DIAGNOSIS — Z91040 Latex allergy status: Secondary | ICD-10-CM | POA: Insufficient documentation

## 2013-05-18 DIAGNOSIS — Z79899 Other long term (current) drug therapy: Secondary | ICD-10-CM | POA: Insufficient documentation

## 2013-05-18 DIAGNOSIS — Z9089 Acquired absence of other organs: Secondary | ICD-10-CM | POA: Insufficient documentation

## 2013-05-18 DIAGNOSIS — R109 Unspecified abdominal pain: Secondary | ICD-10-CM

## 2013-05-18 DIAGNOSIS — Z8619 Personal history of other infectious and parasitic diseases: Secondary | ICD-10-CM | POA: Insufficient documentation

## 2013-05-18 DIAGNOSIS — R1084 Generalized abdominal pain: Secondary | ICD-10-CM | POA: Insufficient documentation

## 2013-05-18 DIAGNOSIS — Z3202 Encounter for pregnancy test, result negative: Secondary | ICD-10-CM | POA: Insufficient documentation

## 2013-05-18 DIAGNOSIS — Z862 Personal history of diseases of the blood and blood-forming organs and certain disorders involving the immune mechanism: Secondary | ICD-10-CM | POA: Insufficient documentation

## 2013-05-18 LAB — CBC WITH DIFFERENTIAL/PLATELET
Basophils Absolute: 0 10*3/uL (ref 0.0–0.1)
Basophils Relative: 0 % (ref 0–1)
EOS PCT: 4 % (ref 0–5)
Eosinophils Absolute: 0.4 10*3/uL (ref 0.0–0.7)
HEMATOCRIT: 38 % (ref 36.0–46.0)
HEMOGLOBIN: 12 g/dL (ref 12.0–15.0)
LYMPHS ABS: 3.4 10*3/uL (ref 0.7–4.0)
LYMPHS PCT: 34 % (ref 12–46)
MCH: 25.5 pg — ABNORMAL LOW (ref 26.0–34.0)
MCHC: 31.6 g/dL (ref 30.0–36.0)
MCV: 80.7 fL (ref 78.0–100.0)
MONO ABS: 0.5 10*3/uL (ref 0.1–1.0)
MONOS PCT: 5 % (ref 3–12)
Neutro Abs: 5.7 10*3/uL (ref 1.7–7.7)
Neutrophils Relative %: 57 % (ref 43–77)
Platelets: 313 10*3/uL (ref 150–400)
RBC: 4.71 MIL/uL (ref 3.87–5.11)
RDW: 14.1 % (ref 11.5–15.5)
WBC: 10.1 10*3/uL (ref 4.0–10.5)

## 2013-05-18 LAB — COMPREHENSIVE METABOLIC PANEL
ALT: 20 U/L (ref 0–35)
AST: 15 U/L (ref 0–37)
Albumin: 3.7 g/dL (ref 3.5–5.2)
Alkaline Phosphatase: 111 U/L (ref 39–117)
BUN: 18 mg/dL (ref 6–23)
CALCIUM: 9.7 mg/dL (ref 8.4–10.5)
CO2: 25 meq/L (ref 19–32)
Chloride: 96 mEq/L (ref 96–112)
Creatinine, Ser: 0.65 mg/dL (ref 0.50–1.10)
GLUCOSE: 87 mg/dL (ref 70–99)
Potassium: 3.8 mEq/L (ref 3.7–5.3)
SODIUM: 136 meq/L — AB (ref 137–147)
Total Bilirubin: <0.2 mg/dL — ABNORMAL LOW (ref 0.3–1.2)
Total Protein: 8.3 g/dL (ref 6.0–8.3)

## 2013-05-18 LAB — URINALYSIS, ROUTINE W REFLEX MICROSCOPIC
BILIRUBIN URINE: NEGATIVE
Glucose, UA: NEGATIVE mg/dL
HGB URINE DIPSTICK: NEGATIVE
Ketones, ur: NEGATIVE mg/dL
Leukocytes, UA: NEGATIVE
Nitrite: NEGATIVE
PROTEIN: NEGATIVE mg/dL
Specific Gravity, Urine: 1.028 (ref 1.005–1.030)
UROBILINOGEN UA: 0.2 mg/dL (ref 0.0–1.0)
pH: 6 (ref 5.0–8.0)

## 2013-05-18 LAB — PREGNANCY, URINE: PREG TEST UR: NEGATIVE

## 2013-05-18 LAB — LIPASE, BLOOD: Lipase: 34 U/L (ref 11–59)

## 2013-05-18 MED ORDER — ONDANSETRON HCL 4 MG/2ML IJ SOLN
4.0000 mg | Freq: Once | INTRAMUSCULAR | Status: AC
  Administered 2013-05-18: 4 mg via INTRAVENOUS
  Filled 2013-05-18: qty 2

## 2013-05-18 MED ORDER — KETOROLAC TROMETHAMINE 30 MG/ML IJ SOLN
30.0000 mg | Freq: Once | INTRAMUSCULAR | Status: AC
  Administered 2013-05-18: 30 mg via INTRAVENOUS
  Filled 2013-05-18: qty 1

## 2013-05-18 MED ORDER — SODIUM CHLORIDE 0.9 % IV BOLUS (SEPSIS)
1000.0000 mL | Freq: Once | INTRAVENOUS | Status: AC
  Administered 2013-05-18: 1000 mL via INTRAVENOUS

## 2013-05-18 NOTE — ED Notes (Signed)
Pt arrived to the ED with a complaint of abdominal pain.  Pt states she has had her gallblader removed a year ago and it feels similar to the pain she experience prior to its removal.  Pt states it encompasses the entire abdomen area.  Pt states it is a "quezzy' feeling with a pressure type pain.  Pt states pain began at 2230 hrs yesterday

## 2013-05-18 NOTE — Discharge Instructions (Signed)
You were seen and evaluated for your upper abdominal pain. At this time your lab testing did not show any signs for her concerning or emergent cause of your pain. You reported feeling improved after treatments in the emergency department. Your provider(s) discussed options for further testing including a CAT scan to evaluate your appendix or other possible causes of your pain. You did not wish to have a CAT scan or other testing. It is recommended that you have a recheck of your symptoms in the next 12-24 hours. Please return sooner if you have any worsening pain, fever or other concerning changes in your symptoms.    Abdominal Pain, Adult Many things can cause abdominal pain. Usually, abdominal pain is not caused by a disease and will improve without treatment. It can often be observed and treated at home. Your health care provider will do a physical exam and possibly order blood tests and X-rays to help determine the seriousness of your pain. However, in many cases, more time must pass before a clear cause of the pain can be found. Before that point, your health care provider may not know if you need more testing or further treatment. HOME CARE INSTRUCTIONS  Monitor your abdominal pain for any changes. The following actions may help to alleviate any discomfort you are experiencing:  Only take over-the-counter or prescription medicines as directed by your health care provider.  Do not take laxatives unless directed to do so by your health care provider.  Try a clear liquid diet (broth, tea, or water) as directed by your health care provider. Slowly move to a bland diet as tolerated. SEEK MEDICAL CARE IF:  You have unexplained abdominal pain.  You have abdominal pain associated with nausea or diarrhea.  You have pain when you urinate or have a bowel movement.  You experience abdominal pain that wakes you in the night.  You have abdominal pain that is worsened or improved by eating  food.  You have abdominal pain that is worsened with eating fatty foods. SEEK IMMEDIATE MEDICAL CARE IF:   Your pain does not go away within 2 hours.  You have a fever.  You keep throwing up (vomiting).  Your pain is felt only in portions of the abdomen, such as the right side or the left lower portion of the abdomen.  You pass bloody or black tarry stools. MAKE SURE YOU:  Understand these instructions.   Will watch your condition.   Will get help right away if you are not doing well or get worse.  Document Released: 02/02/2005 Document Revised: 02/13/2013 Document Reviewed: 01/02/2013 [facility] Center[facility] Patient Information 2014 [employer], [place].

## 2013-05-18 NOTE — ED Provider Notes (Signed)
Medical screening examination/treatment/procedure(s) were performed by non-physician practitioner and as supervising physician I was immediately available for consultation/collaboration.    Olivia Mackielga M Deneisha Dade, MD 05/18/13 [phone]

## 2013-05-18 NOTE — ED Provider Notes (Signed)
CSN: [id number]     Arrival date & time 05/18/13  0051 History   First MD Initiated Contact with Patient 05/18/13 0113     Chief Complaint  Patient presents with  . Abdominal Pain   HPI  History provided by the patient. Patient is a 23 year old female with history of cholecystectomy who presents with complaints of diffuse upper abdominal pain and discomfort. Symptoms began around 10:30 this evening. Pain feels like a pressure and similar to pains patient had one year ago with her gallbladder. They have been associated with some nausea. Patient did take some Pepto-Bismol without any change. She denies any aggravating or alleviating factors. Denies any fever, chills or sweats. Denies any associated diarrhea or constipation. No urinary complaints. No lower pelvic pains. No other associated symptoms. Last bowel movement was yesterday. Normally goes 1-2 times every day.    Past Medical History  Diagnosis Date  . Anemia   . Asthma   . H/O varicella   . H/O candidiasis   . Irregular periods/menstrual cycles 07/01/09   Past Surgical History  Procedure Laterality Date  . No past surgeries    . Cholecystectomy  03/20/2012    Procedure: LAPAROSCOPIC CHOLECYSTECTOMY;  Surgeon: Shelly Rubenstein, MD;  Location: [facility]S;  Service: General;  Laterality: N/A;  . Cholecystectomy, laparoscopic N/A 2013   Family History  Problem Relation Age of Onset  . Hypertension Mother   . Cancer Maternal Grandmother     breast  . Cancer Maternal Grandfather     colon  . Anesthesia problems Neg Hx    History  Substance Use Topics  . Smoking status: Never Smoker   . Smokeless tobacco: Never Used  . Alcohol Use: No   OB History   Grav Para Term Preterm Abortions TAB SAB Ect Mult Living   1 1 1  0 0 0 0 0 0 1     Review of Systems  Constitutional: Negative for fever, chills, diaphoresis and appetite change.  Gastrointestinal: Positive for nausea and abdominal pain. Negative for vomiting, diarrhea and  constipation.  Genitourinary: Negative for dysuria, frequency, hematuria and flank pain.  All other systems reviewed and are negative.    Allergies  Iodine; Latex; Shellfish allergy; and Tomato  Home Medications   Current Outpatient Rx  Name  Route  Sig  Dispense  Refill  . bismuth subsalicylate (PEPTO BISMOL) 262 MG/15ML suspension   Oral   Take 30 mLs by mouth every 6 (six) hours as needed.         Marcille Blanco 0.15-0.03 MG tablet   Oral   Take 1 tablet by mouth daily.          BP 130/86  Pulse 86  Temp(Src) 98.1 F (36.7 C) (Oral)  Resp 18  SpO2 100% Physical Exam  Nursing note and vitals reviewed. Constitutional: She is oriented to person, place, and time. She appears well-developed and well-nourished. No distress.  HENT:  Head: Normocephalic.  Cardiovascular: Normal rate and regular rhythm.   Pulmonary/Chest: Effort normal and breath sounds normal. No respiratory distress. She has no wheezes.  Abdominal: Soft. She exhibits no distension. There is tenderness. There is no rebound and no guarding.  Diffuse tenderness throughout.  Musculoskeletal: Normal range of motion.  Neurological: She is alert and oriented to person, place, and time.  Skin: Skin is warm and dry. No rash noted.  Psychiatric: She has a normal mood and affect. Her behavior is normal.    ED Course  Procedures  DIAGNOSTIC STUDIES: Oxygen Saturation is 100% on room air.  COORDINATION OF CARE:  Nursing notes reviewed. Vital signs reviewed. Initial pt interview and examination performed.   1:54 AM-patient seen and evaluated. Patient well-appearing no acute distress. Does not appear in significant pain or discomfort. Discussed work up plan with pt at bedside, which includes lab testing and UA. Pt agrees with plan.  Patient reports feeling slightly better after medications. Denies any pain at this time. On reexamination of the abdomen she is soft with some mild tenderness of the right upper  quadrant. There is no rebounding, guarding or other peritoneal signs. I discussed with patient her lab tests. I also discussed options for further evaluation with a CT scan particularly to evaluate for possible appendicitis. Patient however does not wish to have any further testing or CAT scan at this time. She was given strict return precautions and recommendations for 12-24 hour recheck of pain and symptoms. She agrees with plan and understands to return at any time.  Treatment plan initiated: Medications  ondansetron (ZOFRAN) injection 4 mg (not administered)  sodium chloride 0.9 % bolus 1,000 mL (not administered)  ketorolac (TORADOL) 30 MG/ML injection 30 mg (not administered)   Results for orders placed during the hospital encounter of 05/18/13  CBC WITH DIFFERENTIAL      Result Value Range   WBC 10.1  4.0 - 10.5 K/uL   RBC 4.71  3.87 - 5.11 MIL/uL   Hemoglobin 12.0  12.0 - 15.0 g/dL   HCT 16.138.0  09.636.0 - 04.546.0 %   MCV 80.7  78.0 - 100.0 fL   MCH 25.5 (*) 26.0 - 34.0 pg   MCHC 31.6  30.0 - 36.0 g/dL   RDW 40.914.1  81.111.5 - 91.415.5 %   Platelets 313  150 - 400 K/uL   Neutrophils Relative % 57  43 - 77 %   Neutro Abs 5.7  1.7 - 7.7 K/uL   Lymphocytes Relative 34  12 - 46 %   Lymphs Abs 3.4  0.7 - 4.0 K/uL   Monocytes Relative 5  3 - 12 %   Monocytes Absolute 0.5  0.1 - 1.0 K/uL   Eosinophils Relative 4  0 - 5 %   Eosinophils Absolute 0.4  0.0 - 0.7 K/uL   Basophils Relative 0  0 - 1 %   Basophils Absolute 0.0  0.0 - 0.1 K/uL  COMPREHENSIVE METABOLIC PANEL      Result Value Range   Sodium 136 (*) 137 - 147 mEq/L   Potassium 3.8  3.7 - 5.3 mEq/L   Chloride 96  96 - 112 mEq/L   CO2 25  19 - 32 mEq/L   Glucose, Bld 87  70 - 99 mg/dL   BUN 18  6 - 23 mg/dL   Creatinine, Ser 7.820.65  0.50 - 1.10 mg/dL   Calcium 9.7  8.4 - 95.610.5 mg/dL   Total Protein 8.3  6.0 - 8.3 g/dL   Albumin 3.7  3.5 - 5.2 g/dL   AST 15  0 - 37 U/L   ALT 20  0 - 35 U/L   Alkaline Phosphatase 111  39 - 117 U/L   Total  Bilirubin <0.2 (*) 0.3 - 1.2 mg/dL   GFR calc non Af Amer >90  >90 mL/min   GFR calc Af Amer >90  >90 mL/min  LIPASE, BLOOD      Result Value Range   Lipase 34  11 - 59 U/L  URINALYSIS, ROUTINE W  REFLEX MICROSCOPIC      Result Value Range   Color, Urine YELLOW  YELLOW   APPearance CLOUDY (*) CLEAR   Specific Gravity, Urine 1.028  1.005 - 1.030   pH 6.0  5.0 - 8.0   Glucose, UA NEGATIVE  NEGATIVE mg/dL   Hgb urine dipstick NEGATIVE  NEGATIVE   Bilirubin Urine NEGATIVE  NEGATIVE   Ketones, ur NEGATIVE  NEGATIVE mg/dL   Protein, ur NEGATIVE  NEGATIVE mg/dL   Urobilinogen, UA 0.2  0.0 - 1.0 mg/dL   Nitrite NEGATIVE  NEGATIVE   Leukocytes, UA NEGATIVE  NEGATIVE  PREGNANCY, URINE      Result Value Range   Preg Test, Ur NEGATIVE  NEGATIVE        MDM   1. Abdominal pain        Angus Seller, PA-C 05/18/13 [phone]

## 2013-07-29 LAB — OB RESULTS CONSOLE GC/CHLAMYDIA
Chlamydia: NEGATIVE
GC PROBE AMP, GENITAL: NEGATIVE

## 2013-09-23 LAB — OB RESULTS CONSOLE RPR: RPR: NONREACTIVE

## 2013-09-23 LAB — OB RESULTS CONSOLE HIV ANTIBODY (ROUTINE TESTING): HIV: NONREACTIVE

## 2013-12-25 LAB — OB RESULTS CONSOLE RPR: RPR: NONREACTIVE

## 2014-01-24 ENCOUNTER — Inpatient Hospital Stay (HOSPITAL_COMMUNITY)
Admission: AD | Admit: 2014-01-24 | Discharge: 2014-01-24 | Disposition: A | Discharge status: Home / Self Care | Payer: Medicaid Other | Source: Ambulatory Visit | Attending: Obstetrics and Gynecology | Admitting: Obstetrics and Gynecology

## 2014-01-24 ENCOUNTER — Encounter (HOSPITAL_COMMUNITY): Payer: Self-pay

## 2014-01-24 DIAGNOSIS — O2390 Unspecified genitourinary tract infection in pregnancy, unspecified trimester: Secondary | ICD-10-CM | POA: Diagnosis not present

## 2014-01-24 DIAGNOSIS — B3731 Acute candidiasis of vulva and vagina: Secondary | ICD-10-CM | POA: Diagnosis not present

## 2014-01-24 DIAGNOSIS — R109 Unspecified abdominal pain: Secondary | ICD-10-CM | POA: Insufficient documentation

## 2014-01-24 DIAGNOSIS — B373 Candidiasis of vulva and vagina: Secondary | ICD-10-CM | POA: Diagnosis not present

## 2014-01-24 LAB — URINALYSIS, ROUTINE W REFLEX MICROSCOPIC
BILIRUBIN URINE: NEGATIVE
GLUCOSE, UA: NEGATIVE mg/dL
Hgb urine dipstick: NEGATIVE
KETONES UR: NEGATIVE mg/dL
Nitrite: NEGATIVE
PH: 6 (ref 5.0–8.0)
Protein, ur: NEGATIVE mg/dL
Specific Gravity, Urine: 1.01 (ref 1.005–1.030)
Urobilinogen, UA: 0.2 mg/dL (ref 0.0–1.0)

## 2014-01-24 LAB — WET PREP, GENITAL
Clue Cells Wet Prep HPF POC: NONE SEEN
TRICH WET PREP: NONE SEEN

## 2014-01-24 LAB — URINE MICROSCOPIC-ADD ON

## 2014-01-24 NOTE — MAU Note (Signed)
Patient states that she is having sharp lower abdominal pain that is worse with movement about every 5-6 minutes. Has vomited once today. Denies bleeding or leaking and reports good fetal movement.

## 2014-01-24 NOTE — MAU Provider Note (Signed)
History     CSN: [id number]  Arrival date and time: 01/24/14 1212   First Provider Initiated Contact with Patient 01/24/14 1345      Chief Complaint  Patient presents with  . Abdominal Pain   Abdominal Pain    Karen Simon is a 23 y.o. G2P1001 at [redacted]w[redacted]d who presents today with lower abdominal pain. She states that this morning she was having pain every few minutes and thought it was contractions. By 11 am she states that they had decreased to about every 5-6 minutes. She states that since being on the monitor she has not had any pain. She denies any vaginal bleeding or LOF and confirms fetal movement. She has had vaginal discharge with some irritation as well. She denies any recent intercourse.   Past Medical History  Diagnosis Date  . Anemia   . Asthma   . H/O varicella   . H/O candidiasis   . Irregular periods/menstrual cycles 07/01/09    Past Surgical History  Procedure Laterality Date  . No past surgeries    . Cholecystectomy  03/20/2012    Procedure: LAPAROSCOPIC CHOLECYSTECTOMY;  Surgeon: Shelly Rubenstein, MD;  Location: [facility];  Service: General;  Laterality: N/A;  . Cholecystectomy, laparoscopic N/A 2013    Family History  Problem Relation Age of Onset  . Hypertension Mother   . Cancer Maternal Grandmother     breast  . Cancer Maternal Grandfather     colon  . Anesthesia problems Neg Hx     History  Substance Use Topics  . Smoking status: Never Smoker   . Smokeless tobacco: Never Used  . Alcohol Use: No    Allergies:  Allergies  Allergen Reactions  . Penicillins Anaphylaxis, Itching and Swelling  . Iodine Hives  . Latex Hives  . Shellfish Allergy Hives  . Tomato Hives and Swelling    Prescriptions prior to admission  Medication Sig Dispense Refill  . miconazole (MONISTAT 7 SIMPLY CURE) 2 % vaginal cream Place 1 Applicatorful vaginally 2 (two) times daily.      . Prenatal Vit-Fe Fumarate-FA (PRENATAL MULTIVITAMIN) TABS tablet Take 1  tablet by mouth daily at 12 noon.        Review of Systems  Gastrointestinal: Positive for abdominal pain.   Physical Exam   Blood pressure 129/65, pulse 96, temperature 98.4 F (36.9 C), temperature source Oral, resp. rate 18, height  (1.651 m), weight 122.199 kg (269 lb 6.4 oz), SpO2 99.00%.  Physical Exam  Nursing note and vitals reviewed. Constitutional: She is oriented to person, place, and time. She appears well-developed and well-nourished. No distress.  Cardiovascular: Normal rate.   Respiratory: Effort normal.  GI: Soft. There is no tenderness. There is no rebound.  Genitourinary:   External: no lesion Vagina: large amount of thick, white adherent discharge.  Cervix: pink, smooth, no fluid seen with valsalva. FT at external os/closed internally/thick/high  Uterus: AGA    Neurological: She is alert and oriented to person, place, and time.  Skin: Skin is warm and dry.  Psychiatric: She has a normal mood and affect.   FHT 140, moderate with 15x15 accels, ? Variable while laying on back.  Toco: no UCs, no UCs to palpation.  MAU Course  Procedures Results for orders placed during the hospital encounter of 01/24/14 (from the past 24 hour(s))  URINALYSIS, ROUTINE W REFLEX MICROSCOPIC     Status: Abnormal   Collection Time    01/24/14 12:35 PM  Result Value Ref Range   Color, Urine YELLOW  YELLOW   APPearance CLEAR  CLEAR   Specific Gravity, Urine 1.010  1.005 - 1.030   pH 6.0  5.0 - 8.0   Glucose, UA NEGATIVE  NEGATIVE mg/dL   Hgb urine dipstick NEGATIVE  NEGATIVE   Bilirubin Urine NEGATIVE  NEGATIVE   Ketones, ur NEGATIVE  NEGATIVE mg/dL   Protein, ur NEGATIVE  NEGATIVE mg/dL   Urobilinogen, UA 0.2  0.0 - 1.0 mg/dL   Nitrite NEGATIVE  NEGATIVE   Leukocytes, UA LARGE (*) NEGATIVE  URINE MICROSCOPIC-ADD ON     Status: Abnormal   Collection Time    01/24/14 12:35 PM      Result Value Ref Range   Squamous Epithelial / LPF MANY (*) RARE   WBC, UA 3-6  <3  WBC/hpf   RBC / HPF 0-2  <3 RBC/hpf   Bacteria, UA MANY (*) RARE   Urine-Other FEW YEAST    WET PREP, GENITAL     Status: Abnormal   Collection Time    01/24/14  1:48 PM      Result Value Ref Range   Yeast Wet Prep HPF POC TOO NUMEROUS TO COUNT (*) NONE SEEN   Trich, Wet Prep NONE SEEN  NONE SEEN   Clue Cells Wet Prep HPF POC NONE SEEN  NONE SEEN   WBC, Wet Prep HPF POC TOO NUMEROUS TO COUNT (*) NONE SEEN   1514: Patient is ok for DC home. Can do 7 day OTC treatment for yeast infection.  Assessment and Plan   1. Yeast infection involving the vagina and surrounding area    OTC 7 day yeast treatment Return to MAU as needed  Follow-up Information   Follow up with Kajuana Shareef A, MD. (As scheduled)    Specialty:  Obstetrics and Gynecology   Contact information:   [address] [place] [address] [phone]        Tawnya Crook 01/24/2014, 1:58 PM

## 2014-01-24 NOTE — Discharge Instructions (Signed)
Please use an over the counter 7 day treatment for yeast infection   Candidal Vulvovaginitis Candidal vulvovaginitis is an infection of the vagina and vulva. The vulva is the skin around the opening of the vagina. This may cause itching and discomfort in and around the vagina.  HOME CARE  Only take medicine as told by your doctor.  Do not have sex (intercourse) until the infection is healed or as told by your doctor.  Practice safe sex.  Tell your sex partner about your infection.  Do not douche or use tampons.  Wear cotton underwear. Do not wear tight pants or panty hose.  Eat yogurt. This may help treat and prevent yeast infections. GET HELP RIGHT AWAY IF:   You have a fever.  Your problems get worse during treatment or do not get better in 3 days.  You have discomfort, irritation, or itching in your vagina or vulva area.  You have pain after sex.  You start to get belly (abdominal) pain. MAKE SURE YOU:  Understand these instructions.  Will watch your condition.  Will get help right away if you are not doing well or get worse. Document Released: 07/22/2008 Document Revised: 04/30/2013 Document Reviewed: 07/22/2008 [facility] Patient Information 2015 [place], [place]. This information is not intended to replace advice given to you by your health care provider. Make sure you discuss any questions you have with your health care provider.

## 2014-02-17 LAB — OB RESULTS CONSOLE GBS: STREP GROUP B AG: POSITIVE

## 2014-02-17 LAB — OB RESULTS CONSOLE GC/CHLAMYDIA
Chlamydia: NEGATIVE
Gonorrhea: NEGATIVE

## 2014-02-23 ENCOUNTER — Encounter (HOSPITAL_COMMUNITY): Payer: Medicaid Other | Admitting: Anesthesiology

## 2014-02-23 ENCOUNTER — Inpatient Hospital Stay (HOSPITAL_COMMUNITY): Payer: Medicaid Other | Admitting: Anesthesiology

## 2014-02-23 ENCOUNTER — Inpatient Hospital Stay (HOSPITAL_COMMUNITY)
Admission: AD | Admit: 2014-02-23 | Discharge: 2014-02-25 | DRG: 775 | Disposition: A | Discharge status: Home / Self Care | Payer: Medicaid Other | Source: Ambulatory Visit | Attending: Obstetrics and Gynecology | Admitting: Obstetrics and Gynecology

## 2014-02-23 ENCOUNTER — Encounter (HOSPITAL_COMMUNITY): Payer: Self-pay | Admitting: *Deleted

## 2014-02-23 DIAGNOSIS — O471 False labor at or after 37 completed weeks of gestation: Secondary | ICD-10-CM | POA: Diagnosis present

## 2014-02-23 DIAGNOSIS — Z3A37 37 weeks gestation of pregnancy: Secondary | ICD-10-CM | POA: Diagnosis present

## 2014-02-23 DIAGNOSIS — O99824 Streptococcus B carrier state complicating childbirth: Secondary | ICD-10-CM | POA: Diagnosis present

## 2014-02-23 DIAGNOSIS — Z91040 Latex allergy status: Secondary | ICD-10-CM

## 2014-02-23 DIAGNOSIS — O99214 Obesity complicating childbirth: Secondary | ICD-10-CM | POA: Diagnosis present

## 2014-02-23 LAB — URINALYSIS, ROUTINE W REFLEX MICROSCOPIC
Bilirubin Urine: NEGATIVE
GLUCOSE, UA: NEGATIVE mg/dL
HGB URINE DIPSTICK: NEGATIVE
KETONES UR: NEGATIVE mg/dL
Leukocytes, UA: NEGATIVE
Nitrite: NEGATIVE
PROTEIN: NEGATIVE mg/dL
Specific Gravity, Urine: 1.015 (ref 1.005–1.030)
Urobilinogen, UA: 0.2 mg/dL (ref 0.0–1.0)
pH: 6.5 (ref 5.0–8.0)

## 2014-02-23 LAB — CBC
HEMATOCRIT: 32.6 % — AB (ref 36.0–46.0)
HEMOGLOBIN: 10.7 g/dL — AB (ref 12.0–15.0)
MCH: 26.1 pg (ref 26.0–34.0)
MCHC: 32.8 g/dL (ref 30.0–36.0)
MCV: 79.5 fL (ref 78.0–100.0)
Platelets: 214 10*3/uL (ref 150–400)
RBC: 4.1 MIL/uL (ref 3.87–5.11)
RDW: 14.7 % (ref 11.5–15.5)
WBC: 7.9 10*3/uL (ref 4.0–10.5)

## 2014-02-23 LAB — TYPE AND SCREEN
ABO/RH(D): B POS
Antibody Screen: NEGATIVE

## 2014-02-23 MED ORDER — PRENATAL MULTIVITAMIN CH
1.0000 | ORAL_TABLET | Freq: Every day | ORAL | Status: DC
  Administered 2014-02-24: 1 via ORAL
  Filled 2014-02-23: qty 1

## 2014-02-23 MED ORDER — INFLUENZA VAC SPLIT QUAD 0.5 ML IM SUSY
0.5000 mL | PREFILLED_SYRINGE | INTRAMUSCULAR | Status: AC
  Administered 2014-02-24: 0.5 mL via INTRAMUSCULAR
  Filled 2014-02-23: qty 0.5

## 2014-02-23 MED ORDER — OXYCODONE-ACETAMINOPHEN 5-325 MG PO TABS: 2.0000 | ORAL_TABLET | ORAL | Status: DC | PRN

## 2014-02-23 MED ORDER — LACTATED RINGERS IV SOLN
INTRAVENOUS | Status: DC
  Administered 2014-02-23: 16:00:00 via INTRAVENOUS

## 2014-02-23 MED ORDER — LANOLIN HYDROUS EX OINT: TOPICAL_OINTMENT | CUTANEOUS | Status: DC | PRN

## 2014-02-23 MED ORDER — DIPHENHYDRAMINE HCL 50 MG/ML IJ SOLN
12.5000 mg | INTRAMUSCULAR | Status: DC | PRN
Start: 2014-02-23 — End: 2014-02-23

## 2014-02-23 MED ORDER — MISOPROSTOL 200 MCG PO TABS
ORAL_TABLET | ORAL | Status: AC
  Filled 2014-02-23: qty 1

## 2014-02-23 MED ORDER — ONDANSETRON HCL 4 MG/2ML IJ SOLN: 4.0000 mg | INTRAMUSCULAR | Status: DC | PRN

## 2014-02-23 MED ORDER — EPHEDRINE 5 MG/ML INJ
10.0000 mg | INTRAVENOUS | Status: DC | PRN
  Filled 2014-02-23: qty 2

## 2014-02-23 MED ORDER — IBUPROFEN 600 MG PO TABS
600.0000 mg | ORAL_TABLET | Freq: Four times a day (QID) | ORAL | Status: DC
  Administered 2014-02-24 – 2014-02-25 (×6): 600 mg via ORAL
  Filled 2014-02-23 (×6): qty 1

## 2014-02-23 MED ORDER — ONDANSETRON HCL 4 MG/2ML IJ SOLN
4.0000 mg | Freq: Four times a day (QID) | INTRAMUSCULAR | Status: DC | PRN
  Administered 2014-02-23: 4 mg via INTRAVENOUS
  Filled 2014-02-23: qty 2

## 2014-02-23 MED ORDER — METHYLERGONOVINE MALEATE 0.2 MG/ML IJ SOLN
INTRAMUSCULAR | Status: AC
  Filled 2014-02-23: qty 1

## 2014-02-23 MED ORDER — PHENYLEPHRINE 40 MCG/ML (10ML) SYRINGE FOR IV PUSH (FOR BLOOD PRESSURE SUPPORT)
80.0000 ug | PREFILLED_SYRINGE | INTRAVENOUS | Status: DC | PRN
  Filled 2014-02-23: qty 2
  Filled 2014-02-23: qty 10

## 2014-02-23 MED ORDER — FENTANYL 2.5 MCG/ML BUPIVACAINE 1/10 % EPIDURAL INFUSION (WH - ANES)
14.0000 mL/h | INTRAMUSCULAR | Status: DC | PRN
  Administered 2014-02-23: 14 mL/h via EPIDURAL
  Filled 2014-02-23: qty 125

## 2014-02-23 MED ORDER — ONDANSETRON HCL 4 MG PO TABS: 4.0000 mg | ORAL_TABLET | ORAL | Status: DC | PRN

## 2014-02-23 MED ORDER — VANCOMYCIN HCL IN DEXTROSE 1-5 GM/200ML-% IV SOLN
1000.0000 mg | Freq: Two times a day (BID) | INTRAVENOUS | Status: DC
  Administered 2014-02-23: 1000 mg via INTRAVENOUS
  Filled 2014-02-23 (×2): qty 200

## 2014-02-23 MED ORDER — LACTATED RINGERS IV SOLN: 500.0000 mL | INTRAVENOUS | Status: DC | PRN

## 2014-02-23 MED ORDER — OXYCODONE-ACETAMINOPHEN 5-325 MG PO TABS
2.0000 | ORAL_TABLET | ORAL | Status: DC | PRN
  Administered 2014-02-24 (×2): 2 via ORAL
  Filled 2014-02-23 (×2): qty 2

## 2014-02-23 MED ORDER — LIDOCAINE HCL (PF) 1 % IJ SOLN
INTRAMUSCULAR | Status: DC | PRN
  Administered 2014-02-23: 3 mL
  Administered 2014-02-23 (×2): 5 mL

## 2014-02-23 MED ORDER — OXYCODONE-ACETAMINOPHEN 5-325 MG PO TABS: 1.0000 | ORAL_TABLET | ORAL | Status: DC | PRN

## 2014-02-23 MED ORDER — LIDOCAINE HCL (PF) 1 % IJ SOLN
30.0000 mL | INTRAMUSCULAR | Status: DC | PRN
  Filled 2014-02-23: qty 30

## 2014-02-23 MED ORDER — TETANUS-DIPHTH-ACELL PERTUSSIS 5-2.5-18.5 LF-MCG/0.5 IM SUSP: 0.5000 mL | Freq: Once | INTRAMUSCULAR | Status: DC

## 2014-02-23 MED ORDER — OXYTOCIN 40 UNITS IN LACTATED RINGERS INFUSION - SIMPLE MED
62.5000 mL/h | INTRAVENOUS | Status: DC
  Administered 2014-02-23: 62.5 mL/h via INTRAVENOUS
  Filled 2014-02-23: qty 1000

## 2014-02-23 MED ORDER — SENNOSIDES-DOCUSATE SODIUM 8.6-50 MG PO TABS
2.0000 | ORAL_TABLET | ORAL | Status: DC
  Administered 2014-02-24 – 2014-02-25 (×2): 2 via ORAL
  Filled 2014-02-23 (×2): qty 2

## 2014-02-23 MED ORDER — ACETAMINOPHEN 325 MG PO TABS: 650.0000 mg | ORAL_TABLET | ORAL | Status: DC | PRN

## 2014-02-23 MED ORDER — CITRIC ACID-SODIUM CITRATE 334-500 MG/5ML PO SOLN: 30.0000 mL | ORAL | Status: DC | PRN

## 2014-02-23 MED ORDER — BENZOCAINE-MENTHOL 20-0.5 % EX AERO
1.0000 "application " | INHALATION_SPRAY | CUTANEOUS | Status: DC | PRN
  Administered 2014-02-24: 1 via TOPICAL
  Filled 2014-02-23: qty 56

## 2014-02-23 MED ORDER — WITCH HAZEL-GLYCERIN EX PADS: 1.0000 "application " | MEDICATED_PAD | CUTANEOUS | Status: DC | PRN

## 2014-02-23 MED ORDER — OXYTOCIN BOLUS FROM INFUSION
500.0000 mL | INTRAVENOUS | Status: DC
  Administered 2014-02-23: 500 mL via INTRAVENOUS

## 2014-02-23 MED ORDER — PHENYLEPHRINE 40 MCG/ML (10ML) SYRINGE FOR IV PUSH (FOR BLOOD PRESSURE SUPPORT)
80.0000 ug | PREFILLED_SYRINGE | INTRAVENOUS | Status: DC | PRN
  Filled 2014-02-23: qty 2

## 2014-02-23 MED ORDER — DIPHENHYDRAMINE HCL 25 MG PO CAPS: 25.0000 mg | ORAL_CAPSULE | Freq: Four times a day (QID) | ORAL | Status: DC | PRN

## 2014-02-23 MED ORDER — ZOLPIDEM TARTRATE 5 MG PO TABS: 5.0000 mg | ORAL_TABLET | Freq: Every evening | ORAL | Status: DC | PRN

## 2014-02-23 MED ORDER — DIBUCAINE 1 % RE OINT: 1.0000 "application " | TOPICAL_OINTMENT | RECTAL | Status: DC | PRN

## 2014-02-23 MED ORDER — FLEET ENEMA 7-19 GM/118ML RE ENEM: 1.0000 | ENEMA | RECTAL | Status: DC | PRN

## 2014-02-23 MED ORDER — OXYCODONE-ACETAMINOPHEN 5-325 MG PO TABS
1.0000 | ORAL_TABLET | ORAL | Status: DC | PRN
  Administered 2014-02-24: 1 via ORAL
  Filled 2014-02-23: qty 1

## 2014-02-23 MED ORDER — LACTATED RINGERS IV SOLN
500.0000 mL | Freq: Once | INTRAVENOUS | Status: AC
  Administered 2014-02-23: 500 mL via INTRAVENOUS

## 2014-02-23 MED ORDER — MISOPROSTOL 200 MCG PO TABS
ORAL_TABLET | ORAL | Status: AC
  Filled 2014-02-23: qty 4

## 2014-02-23 MED ORDER — FENTANYL 2.5 MCG/ML BUPIVACAINE 1/10 % EPIDURAL INFUSION (WH - ANES)
INTRAMUSCULAR | Status: DC | PRN
  Administered 2014-02-23: 14 mL/h via EPIDURAL

## 2014-02-23 MED ORDER — SIMETHICONE 80 MG PO CHEW: 80.0000 mg | CHEWABLE_TABLET | ORAL | Status: DC | PRN

## 2014-02-23 NOTE — Anesthesia Preprocedure Evaluation (Signed)
Anesthesia Evaluation  Patient identified by MRN, date of birth, ID band Patient awake    Reviewed: Allergy & Precautions, H&P , NPO status , Patient's Chart, lab work & pertinent test results  History of Anesthesia Complications Negative for: history of anesthetic complications  Airway Mallampati: II TM Distance: >3 FB Neck ROM: full    Dental no notable dental hx. (+) Teeth Intact   Pulmonary neg pulmonary ROS, asthma ,  breath sounds clear to auscultation  Pulmonary exam normal       Cardiovascular negative cardio ROS  Rhythm:regular Rate:Normal     Neuro/Psych negative neurological ROS  negative psych ROS   GI/Hepatic negative GI ROS, Neg liver ROS,   Endo/Other  negative endocrine ROSMorbid obesity  Renal/GU negative Renal ROS  negative genitourinary   Musculoskeletal   Abdominal Normal abdominal exam  (+)   Peds  Hematology negative hematology ROS (+)   Anesthesia Other Findings   Reproductive/Obstetrics (+) Pregnancy                           Anesthesia Physical Anesthesia Plan  ASA: III  Anesthesia Plan: Epidural   Post-op Pain Management:    Induction:   Airway Management Planned:   Additional Equipment:   Intra-op Plan:   Post-operative Plan:   Informed Consent: I have reviewed the patients History and Physical, chart, labs and discussed the procedure including the risks, benefits and alternatives for the proposed anesthesia with the patient or authorized representative who has indicated his/her understanding and acceptance.     Plan Discussed with:   Anesthesia Plan Comments:         Anesthesia Quick Evaluation

## 2014-02-23 NOTE — MAU Note (Signed)
Pt states here for contractions that are q6 minutes apart. Denies bleeding or lof.

## 2014-02-23 NOTE — MAU Provider Note (Signed)
Karen Simon is a 23 y.o. G2P1001 at 37.1 weeks c/o ctx since noon today.  Pt denies vb, lof with +FM.  Her last labor was 17 hours. Pt states she does NOT want to be here.  She came because her mother and husband insisted.  She want to leave to have lunch and if the ctx get worse she will come back   History     Patient Active Problem List   Diagnosis Date Noted  . Lactating mother 11/27/2011  . Teen pregnancy 11/27/2011  . Pregnant state, incidental 11/25/2011  . Vaginal delivery 11/25/2011  . Perineal laceration complicating delivery 11/25/2011  . Candida albicans infection 11/15/2011  . Excess weight gain in pregnancy 11/07/2011  . Anemia in pregnancy 09/14/2011  . Obese 04/12/2011  . History of high blood pressure - never took meds 04/12/2011  . 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 History of anemia 11/26/2010  . Asthma 11/26/2010  . Migraine 11/26/2010    Chief Complaint  Patient presents with  . Contractions   HPI  OB History   Grav Para Term Preterm Abortions TAB SAB Ect Mult Living   2 1 1  0 0 0 0 0 0 1      Past Medical History  Diagnosis Date  . Anemia   . Asthma   . H/O varicella   . H/O candidiasis   . Irregular periods/menstrual cycles 07/01/09    Past Surgical History  Procedure Laterality Date  . No past surgeries    . Cholecystectomy  03/20/2012    Procedure: LAPAROSCOPIC CHOLECYSTECTOMY;  Surgeon: Shelly Rubensteinouglas A Blackman, MD;  Location: [facility];  Service: General;  Laterality: N/A;  . Cholecystectomy, laparoscopic N/A 2013    Family History  Problem Relation Age of Onset  . Hypertension Mother   . Cancer Maternal Grandmother     breast  . Cancer Maternal Grandfather     colon  . Anesthesia problems Neg Hx     History  Substance Use Topics  . Smoking status: Never Smoker   . Smokeless tobacco: Never Used  . Alcohol Use: No    Allergies:  Allergies  Allergen Reactions  . Penicillins Anaphylaxis, Itching  and Swelling  . Iodine Hives  . Latex Hives  . Shellfish Allergy Hives  . Tomato Hives and Swelling    Prescriptions prior to admission  Medication Sig Dispense Refill  . miconazole (MONISTAT 7 SIMPLY CURE) 2 % vaginal cream Place 1 Applicatorful vaginally 2 (two) times daily.      . Prenatal Vit-Fe Fumarate-FA (PRENATAL MULTIVITAMIN) TABS tablet Take 1 tablet by mouth daily at 12 noon.        ROS See HPI above, all other systems are negative  Physical Exam   There were no vitals taken for this visit.  Physical Exam Ext:  WNL ABD: Soft, non tender to palpation, no rebound or guarding SVE: 3-4/80/0   ED Course  Assessment: IUP at  37.1weeks Membranes: intact  Plan: NST Care and report given to faculty practice    Essense Bousquet, CNM, MSN 02/23/2014. 3:04 PM

## 2014-02-23 NOTE — Anesthesia Procedure Notes (Signed)
Epidural Patient location during procedure: OB  Staffing Anesthesiologist: Phillips GroutARIGNAN, Kamarian Sahakian Performed by: anesthesiologist   Preanesthetic Checklist Completed: patient identified, site marked, surgical consent, pre-op evaluation, timeout performed, IV checked, risks and benefits discussed and monitors and equipment checked  Epidural Patient position: sitting Prep: ChloraPrep Patient monitoring: heart rate, continuous pulse ox and blood pressure Approach: right paramedian Location: L4-L5 Injection technique: LOR saline  Needle:  Needle type: Tuohy  Needle gauge: 17 G Needle length: 9 cm and 9 Needle insertion depth: 7 cm Catheter type: closed end flexible Catheter size: 20 Guage Catheter at skin depth: 12 cm Test dose: negative  Assessment Events: blood not aspirated, injection not painful, no injection resistance, negative IV test and no paresthesia  Additional Notes   Patient tolerated the insertion well without complications.

## 2014-02-23 NOTE — H&P (Signed)
23 y.o. 6828w1d  G2P1001 comes in c/o painful ctx.  Otherwise has good fetal movement and no bleeding.  Past Medical History  Diagnosis Date  . Anemia   . Asthma   . H/O varicella   . H/O candidiasis   . Irregular periods/menstrual cycles 07/01/09    Past Surgical History  Procedure Laterality Date  . No past surgeries    . Cholecystectomy  03/20/2012    Procedure: LAPAROSCOPIC CHOLECYSTECTOMY;  Surgeon: Shelly Rubensteinouglas A Blackman, MD;  Location: [facility];  Service: General;  Laterality: N/A;  . Cholecystectomy, laparoscopic N/A 2013    OB History  Gravida Para Term Preterm AB SAB TAB Ectopic Multiple Living  2 1 1  0 0 0 0 0 0 1    # Outcome Date GA Lbr Len/2nd Weight Sex Delivery Anes PTL Lv  2 CUR           1 TRM 11/25/11 4039w3d 14:18 / 00:50 3.795 kg (8 lb 5.9 oz) M SVD EPI  Y     Comments: caput      History   Social History  . Marital Status: Married    Spouse Name: N/A    Number of Children: N/A  . Years of Education: N/A   Occupational History  . Not on file.   Social History Main Topics  . Smoking status: Never Smoker   . Smokeless tobacco: Never Used  . Alcohol Use: No  . Drug Use: No  . Sexual Activity: Yes    Birth Control/ Protection: None     Comment: currently pregnant   Other Topics Concern  . Not on file   Social History Narrative  . No narrative on file   Penicillins; Iodine; Latex; Shellfish allergy; and Tomato    Prenatal Transfer Tool  Maternal Diabetes: No Genetic Screening: Normal Maternal Ultrasounds/Referrals: Normal, face not seen Fetal Ultrasounds or other Referrals:  None Maternal Substance Abuse:  No Significant Maternal Medications:  None Significant Maternal Lab Results: Lab values include: Group B Strep positive  Other PNC: [facility], morbid obesity    Filed Vitals:   02/23/14 1636  BP: 102/67  Pulse: 91  Temp:   Resp:      Lungs/Cor:  NAD Abdomen:  soft, gravid Ex:  no cords, erythema SVE:   3.5/80/0 FHTs:  140, good STV, +10x10, early variables Toco:  q5   A/P   Admit in labor  GBS Pos, anaphylaxis to PCN, sensitivities requested but not performed, will do 1g vanocmycin q12  Other routine care  Epidural desired now.  Philip AspenALLAHAN, Maciej Schweitzer

## 2014-02-23 NOTE — Progress Notes (Signed)
Mother given double electric pump and hand pump and shown how to use both. Mother encouraged to pump every three hours for 15 minutes. Mother given formula information sheet and instructed on formula feeding. Mother encouraged to call nurse for further assistance as needed with feedings.

## 2014-02-24 LAB — CBC
HCT: 30 % — ABNORMAL LOW (ref 36.0–46.0)
Hemoglobin: 9.7 g/dL — ABNORMAL LOW (ref 12.0–15.0)
MCH: 25.9 pg — ABNORMAL LOW (ref 26.0–34.0)
MCHC: 32.3 g/dL (ref 30.0–36.0)
MCV: 80.2 fL (ref 78.0–100.0)
Platelets: 195 10*3/uL (ref 150–400)
RBC: 3.74 MIL/uL — ABNORMAL LOW (ref 3.87–5.11)
RDW: 14.7 % (ref 11.5–15.5)
WBC: 9.1 10*3/uL (ref 4.0–10.5)

## 2014-02-24 LAB — RPR

## 2014-02-24 NOTE — Anesthesia Postprocedure Evaluation (Signed)
  Anesthesia Post-op Note  Patient: Karen Simon P Pung  Procedure(s) Performed: * No procedures listed *  Patient Location: Mother/Baby  Anesthesia Type:Epidural  Level of Consciousness: awake, alert , oriented and patient cooperative  Airway and Oxygen Therapy: Patient Spontanous Breathing  Post-op Pain: mild  Post-op Assessment: Post-op Vital signs reviewed, Patient's Cardiovascular Status Stable, Respiratory Function Stable, Patent Airway, No headache, No backache, No residual numbness and No residual motor weakness  Post-op Vital Signs: Reviewed and stable  Last Vitals:  Filed Vitals:   02/24/14 0610  BP: 114/60  Pulse: 66  Temp:   Resp: 18    Complications: No apparent anesthesia complications

## 2014-02-24 NOTE — Lactation Note (Signed)
This note was copied from the chart of Karen Dario Aveiamond Sturdivant. Lactation Consultation Note Wants to pump and bottle feed. RN took DEBP into rm. But hasn't pumped all night. Wanted to sleep, FOB assisting in care of baby. Has been getting formula. Sleeping when attempted to see X2. Patient Name: Karen Simon ZHYQM'VToday's Date: 02/24/2014     Maternal Data    Feeding Feeding Type: Bottle Fed - Formula Nipple Type: Slow - flow  LATCH Score/Interventions                      Lactation Tools Discussed/Used     Consult Status      Rikki Trosper G 02/24/2014, 6:57 AM

## 2014-02-24 NOTE — Progress Notes (Signed)
Post Partum Day 1 Subjective: no complaints, up ad lib, voiding and tolerating PO  Objective: Blood pressure 114/60, pulse 66, temperature 97.8 F (36.6 C), temperature source Oral, resp. rate 18, height 5\' 5"  (1.651 m), weight 124.739 kg (275 lb), SpO2 100.00%, unknown if currently breastfeeding.  Physical Exam:  General: alert, cooperative and appears stated age Lochia: appropriate Uterine Fundus: firm    Recent Labs  02/23/14 1547 02/24/14 0615  HGB 10.7* 9.7*  HCT 32.6* 30.0*    Assessment/Plan: Plan for discharge tomorrow   LOS: 1 day   Naylin Burkle H. 02/24/2014, 9:49 AM

## 2014-02-24 NOTE — Lactation Note (Signed)
This note was copied from the chart of Karen Dario Aveiamond Sardinha. Lactation Consultation Note Initial visit at 21 hours of age.  Mom reports she is planning to pump and bottle feed.  She does not want to latch baby and denies assistance when offered.  Mom reports pumping about 20 minutes ago for 5 minutes, but she was having cramping.  Encouraged mom to pump at least 8 times in 24 hours, mom was surprised and said it was time for her to "get serious" about this because she wants to provide breastmilk.  Preemie setting demonstrated and encouraged for 15 minutes and then to follow with several minutes of hand expression to give EBM to baby. Mom reports doing some hand expression with an older child and has a DEBP at home already.  Mom denies concerns at this time.  Patient Name: Karen Simon Today's Date: 02/24/2014 Reason for consult: Initial assessment   Maternal Data Has patient been taught Hand Expression?: Yes Does the patient have breastfeeding experience prior to this delivery?: Yes  Feeding Feeding Type: Bottle Fed - Formula Nipple Type: Slow - flow  LATCH Score/Interventions                      Lactation Tools Discussed/Used Pump Review: Setup, frequency, and cleaning   Consult Status Consult Status: Complete    Mataya Kilduff, Arvella MerlesJana Lynn 02/24/2014, 4:41 PM

## 2014-02-24 NOTE — Progress Notes (Signed)
UR chart review completed.  

## 2014-02-25 NOTE — Lactation Note (Signed)
This note was copied from the chart of Karen Simon. Lactation Dario Aveonsultation Note  Patient Name: Karen Simon ZOXWR'UToday's Date: 02/25/2014 Reason for consult: Follow-up assessment Mom is pump and bottle feeding, Has DEBP at home. Denies questions or concerns. Advised of OP services and support group.   Maternal Data    Feeding    LATCH Score/Interventions                      Lactation Tools Discussed/Used Tools: Pump Breast pump type: Double-Electric Breast Pump   Consult Status Consult Status: Complete    Alfred LevinsGranger, Shenelle Klas Ann 02/25/2014, 11:05 AM

## 2014-02-25 NOTE — Discharge Summary (Signed)
Obstetric Discharge Summary Reason for Admission: onset of labor Prenatal Procedures: none Intrapartum Procedures: spontaneous vaginal delivery Postpartum Procedures: none Complications-Operative and Postpartum: none Hemoglobin  Date Value Ref Range Status  02/24/2014 9.7* 12.0 - 15.0 g/dL Final     HCT  Date Value Ref Range Status  02/24/2014 30.0* 36.0 - 46.0 % Final    Physical Exam:  General: alert, cooperative and no distress Lochia: appropriate Uterine Fundus: firm DVT Evaluation: No evidence of DVT seen on physical exam. Negative Homan's sign. No cords or calf tenderness. No significant calf/ankle edema.  Discharge Diagnoses: Term Pregnancy-delivered  Discharge Information: Date: 02/25/2014 Activity: pelvic rest Diet: routine Medications: PNV and Ibuprofen Condition: stable Instructions: refer to practice specific booklet Discharge to: home   Newborn Data: Live born female  Birth Weight: 6 lb 5.1 oz (2865 g) APGAR: 9, 9  Home with mother.  Essie HartINN, Leila Schuff STACIA 02/25/2014, 9:44 AM

## 2014-02-25 NOTE — Progress Notes (Signed)
Post Partum Day 2 Subjective: no complaints, up ad lib, voiding, tolerating PO, + flatus and both bottle and breast feeding  Objective: Blood pressure 118/67, pulse 72, temperature 97.8 F (36.6 C), temperature source Oral, resp. rate 18, height 5\' 5"  (1.651 m), weight 124.739 kg (275 lb), SpO2 100.00%, unknown if currently breastfeeding.  Physical Exam:  General: alert, cooperative and no distress Lochia: appropriate Uterine Fundus: firm DVT Evaluation: No evidence of DVT seen on physical exam. Negative Homan's sign. No cords or calf tenderness. No significant calf/ankle edema.   Recent Labs  02/23/14 1547 02/24/14 0615  HGB 10.7* 9.7*  HCT 32.6* 30.0*    Assessment/Plan: Discharge home and Contraception will discuss at post partum visit   LOS: 2 days   Yaw Escoto STACIA 02/25/2014, 9:40 AM

## 2014-03-10 ENCOUNTER — Encounter (HOSPITAL_COMMUNITY): Payer: Self-pay | Admitting: *Deleted

## 2014-03-25 ENCOUNTER — Other Ambulatory Visit: Payer: Self-pay

## 2014-03-27 LAB — CYTOLOGY - PAP

## 2014-08-07 ENCOUNTER — Encounter (HOSPITAL_COMMUNITY): Payer: Self-pay

## 2014-08-07 ENCOUNTER — Emergency Department (HOSPITAL_COMMUNITY)
Admission: EM | Admit: 2014-08-07 | Discharge: 2014-08-08 | Disposition: A | Discharge status: Home / Self Care | Payer: Medicaid Other | Attending: Emergency Medicine | Admitting: Emergency Medicine

## 2014-08-07 DIAGNOSIS — Z862 Personal history of diseases of the blood and blood-forming organs and certain disorders involving the immune mechanism: Secondary | ICD-10-CM | POA: Diagnosis not present

## 2014-08-07 DIAGNOSIS — I10 Essential (primary) hypertension: Secondary | ICD-10-CM | POA: Insufficient documentation

## 2014-08-07 DIAGNOSIS — K625 Hemorrhage of anus and rectum: Secondary | ICD-10-CM | POA: Insufficient documentation

## 2014-08-07 DIAGNOSIS — R109 Unspecified abdominal pain: Secondary | ICD-10-CM | POA: Diagnosis present

## 2014-08-07 DIAGNOSIS — Z8619 Personal history of other infectious and parasitic diseases: Secondary | ICD-10-CM | POA: Insufficient documentation

## 2014-08-07 DIAGNOSIS — J45909 Unspecified asthma, uncomplicated: Secondary | ICD-10-CM | POA: Diagnosis not present

## 2014-08-07 DIAGNOSIS — Z3202 Encounter for pregnancy test, result negative: Secondary | ICD-10-CM | POA: Diagnosis not present

## 2014-08-07 DIAGNOSIS — Z880 Allergy status to penicillin: Secondary | ICD-10-CM | POA: Insufficient documentation

## 2014-08-07 DIAGNOSIS — Z8742 Personal history of other diseases of the female genital tract: Secondary | ICD-10-CM | POA: Diagnosis not present

## 2014-08-07 DIAGNOSIS — Z91040 Latex allergy status: Secondary | ICD-10-CM | POA: Insufficient documentation

## 2014-08-07 DIAGNOSIS — R110 Nausea: Secondary | ICD-10-CM | POA: Insufficient documentation

## 2014-08-07 LAB — URINALYSIS, ROUTINE W REFLEX MICROSCOPIC
Bilirubin Urine: NEGATIVE
GLUCOSE, UA: NEGATIVE mg/dL
Hgb urine dipstick: NEGATIVE
Ketones, ur: NEGATIVE mg/dL
Leukocytes, UA: NEGATIVE
Nitrite: NEGATIVE
PH: 6 (ref 5.0–8.0)
PROTEIN: NEGATIVE mg/dL
SPECIFIC GRAVITY, URINE: 1.03 (ref 1.005–1.030)
Urobilinogen, UA: 1 mg/dL (ref 0.0–1.0)

## 2014-08-07 LAB — CBC WITH DIFFERENTIAL/PLATELET
Basophils Absolute: 0 10*3/uL (ref 0.0–0.1)
Basophils Relative: 0 % (ref 0–1)
EOS PCT: 3 % (ref 0–5)
Eosinophils Absolute: 0.2 10*3/uL (ref 0.0–0.7)
HEMATOCRIT: 38.2 % (ref 36.0–46.0)
HEMOGLOBIN: 11.8 g/dL — AB (ref 12.0–15.0)
LYMPHS ABS: 1.7 10*3/uL (ref 0.7–4.0)
LYMPHS PCT: 23 % (ref 12–46)
MCH: 24.8 pg — AB (ref 26.0–34.0)
MCHC: 30.9 g/dL (ref 30.0–36.0)
MCV: 80.4 fL (ref 78.0–100.0)
MONO ABS: 0.3 10*3/uL (ref 0.1–1.0)
MONOS PCT: 5 % (ref 3–12)
Neutro Abs: 5.2 10*3/uL (ref 1.7–7.7)
Neutrophils Relative %: 69 % (ref 43–77)
PLATELETS: 306 10*3/uL (ref 150–400)
RBC: 4.75 MIL/uL (ref 3.87–5.11)
RDW: 14 % (ref 11.5–15.5)
WBC: 7.5 10*3/uL (ref 4.0–10.5)

## 2014-08-07 LAB — POC URINE PREG, ED: PREG TEST UR: NEGATIVE

## 2014-08-07 MED ORDER — ONDANSETRON 8 MG PO TBDP
8.0000 mg | ORAL_TABLET | Freq: Once | ORAL | Status: AC
  Administered 2014-08-08: 8 mg via ORAL
  Filled 2014-08-07: qty 1

## 2014-08-07 NOTE — ED Notes (Signed)
Pt presents with c/o rectal bleeding, abdominal pain, and nausea. Pt reports she has been noticing some blood in her stool for about 5 months but the abdominal pain and nausea started today.

## 2014-08-07 NOTE — ED Notes (Signed)
Patient reports gave birth in 10/15. Patient isn't currently using lactation.

## 2014-08-08 LAB — COMPREHENSIVE METABOLIC PANEL
ALT: 27 U/L (ref 0–35)
AST: 29 U/L (ref 0–37)
Albumin: 4.2 g/dL (ref 3.5–5.2)
Alkaline Phosphatase: 86 U/L (ref 39–117)
Anion gap: 10 (ref 5–15)
BUN: 14 mg/dL (ref 6–23)
CALCIUM: 9 mg/dL (ref 8.4–10.5)
CHLORIDE: 100 mmol/L (ref 96–112)
CO2: 25 mmol/L (ref 19–32)
Creatinine, Ser: 0.62 mg/dL (ref 0.50–1.10)
GFR calc non Af Amer: >90 mL/min (ref >90)
Glucose, Bld: 108 mg/dL — ABNORMAL HIGH (ref 70–99)
Potassium: 3.9 mmol/L (ref 3.5–5.1)
Sodium: 135 mmol/L (ref 135–145)
Total Bilirubin: 0.4 mg/dL (ref 0.3–1.2)
Total Protein: 8.5 g/dL — ABNORMAL HIGH (ref 6.0–8.3)

## 2014-08-08 LAB — POC OCCULT BLOOD, ED: Fecal Occult Bld: POSITIVE — AB

## 2014-08-08 LAB — LIPASE, BLOOD: Lipase: 23 U/L (ref 11–59)

## 2014-08-08 NOTE — Discharge Instructions (Signed)
Bloody Stools  Bloody stools often mean that there is a problem in the digestive tract. Your caregiver may use the term "melena" to describe black, tarry, and bad smelling stools or "hematochezia" to describe red or maroon-colored stools. Blood seen in the stool can be caused by bleeding anywhere along the intestinal tract.   A black stool usually means that blood is coming from the upper part of the gastrointestinal tract (esophagus, stomach, or small bowel). Passing maroon-colored stools or bright red blood usually means that blood is coming from lower down in the large bowel or the rectum. However, sometimes massive bleeding in the stomach or small intestine can cause bright red bloody stools.   Consuming black licorice, lead, iron pills, medicines containing bismuth subsalicylate, or blueberries can also cause black stools. Your caregiver can test black stools to see if blood is present.  It is important that the cause of the bleeding be found. Treatment can then be started, and the problem can be corrected. Rectal bleeding may not be serious, but you should not assume everything is okay until you know the cause. It is very important to follow up with your caregiver or a specialist in gastrointestinal problems.  CAUSES   Blood in the stools can come from various underlying causes. Often, the cause is not found during your first visit. Testing is often needed to discover the cause of bleeding in the gastrointestinal tract. Causes range from simple to serious or even life-threatening. Possible causes include:  · Hemorrhoids. These are veins that are full of blood (engorged) in the rectum. They cause pain, inflammation, and may bleed.  · Anal fissures. These are areas of painful tearing which may bleed. They are often caused by passing hard stool.  · Diverticulosis. These are pouches that form on the colon over time, with age, and may bleed significantly.  · Diverticulitis. This is inflammation in areas with  diverticulosis. It can cause pain, fever, and bloody stools, although bleeding is rare.  · Proctitis and colitis. These are inflamed areas of the rectum or colon. They may cause pain, fever, and bloody stools.  · Polyps and cancer. Colon cancer is a leading cause of preventable cancer death. It often starts out as precancerous polyps that can be removed during a colonoscopy, preventing progression into cancer. Sometimes, polyps and cancer may cause rectal bleeding.  · Gastritis and ulcers. Bleeding from the upper gastrointestinal tract (near the stomach) may travel through the intestines and produce black, sometimes tarry, often bad smelling stools. In certain cases, if the bleeding is fast enough, the stools may not be black, but red and the condition may be life-threatening.  SYMPTOMS   You may have stools that are bright red and bloody, that are normal color with blood on them, or that are dark black and tarry. In some cases, you may only have blood in the toilet bowl. Any of these cases need medical care. You may also have:  · Pain at the anus or anywhere in the rectum.  · Lightheadedness or feeling faint.  · Extreme weakness.  · Nausea or vomiting.  · Fever.  DIAGNOSIS  Your caregiver may use the following methods to find the cause of your bleeding:  · Taking a medical history. Age is important. Older people tend to develop polyps and cancer more often. If there is anal pain and a hard, large stool associated with bleeding, a tear of the anus may be the cause. If blood drips into the toilet after a bowel movement, bleeding hemorrhoids may be the   problem. The color and frequency of the bleeding are additional considerations. In most cases, the medical history provides clues, but seldom the final answer.  · A visual and finger (digital) exam. Your caregiver will inspect the anal area, looking for tears and hemorrhoids. A finger exam can provide information when there is tenderness or a growth inside. In men, the  prostate is also examined.  · Endoscopy. Several types of small, long scopes (endoscopes) are used to view the colon.  ¨ In the office, your caregiver may use a rigid, or more commonly, a flexible viewing sigmoidoscope. This exam is called flexible sigmoidoscopy. It is performed in 5 to 10 minutes.  ¨ A more thorough exam is accomplished with a colonoscope. It allows your caregiver to view the entire 5 to 6 foot long colon. Medicine to help you relax (sedative) is usually given for this exam. Frequently, a bleeding lesion may be present beyond the reach of the sigmoidoscope. So, a colonoscopy may be the best exam to start with. Both exams are usually done on an outpatient basis. This means the patient does not stay overnight in the hospital or surgery center.  ¨ An upper endoscopy may be needed to examine your stomach. Sedation is used and a flexible endoscope is put in your mouth, down to your stomach.  · A barium enema X-ray. This is an X-ray exam. It uses liquid barium inserted by enema into the rectum. This test alone may not identify an actual bleeding point. X-rays highlight abnormal shadows, such as those made by lumps (tumors), diverticuli, or colitis.  TREATMENT   Treatment depends on the cause of your bleeding.   · For bleeding from the stomach or colon, the caregiver doing your endoscopy or colonoscopy may be able to stop the bleeding as part of the procedure.  · Inflammation or infection of the colon can be treated with medicines.  · Many rectal problems can be treated with creams, suppositories, or warm baths.  · Surgery is sometimes needed.  · Blood transfusions are sometimes needed if you have lost a lot of blood.  · For any bleeding problem, let your caregiver know if you take aspirin or other blood thinners regularly.  HOME CARE INSTRUCTIONS   · Take any medicines exactly as prescribed.  · Keep your stools soft by eating a diet high in fiber. Prunes (1 to 3 a day) work well for many people.  · Drink  enough water and fluids to keep your urine clear or pale yellow.  · Take sitz baths if advised. A sitz bath is when you sit in a bathtub with warm water for 10 to 15 minutes to soak, soothe, and cleanse the rectal area.  · If enemas or suppositories are advised, be sure you know how to use them. Tell your caregiver if you have problems with this.  · Monitor your bowel movements to look for signs of improvement or worsening.  SEEK MEDICAL CARE IF:   · You do not improve in the time expected.  · Your condition worsens after initial improvement.  · You develop any new symptoms.  SEEK IMMEDIATE MEDICAL CARE IF:   · You develop severe or prolonged rectal bleeding.  · You vomit blood.  · You feel weak or faint.  · You have a fever.  MAKE SURE YOU:  · Understand these instructions.  · Will watch your condition.  · Will get help right away if you are not doing well or get worse.    Document Released: 04/15/2002 Document Revised: 07/18/2011 Document Reviewed: 09/10/2010  [employer]® Patient Information ©2015 [employer]. This information is not intended to replace advice given to you by your health care provider. Make sure you discuss any questions you have with your health care provider.

## 2014-08-08 NOTE — ED Provider Notes (Signed)
CSN: [id number]     Arrival date & time 08/07/14  2103 History   First MD Initiated Contact with Patient 08/08/14 0005     Chief Complaint  Patient presents with  . Abdominal Pain  . Rectal Bleeding     (Consider location/radiation/quality/duration/timing/severity/associated sxs/prior Treatment) Patient is a 24 y.o. female presenting with abdominal pain and hematochezia. The history is provided by the patient.  Abdominal Pain Associated symptoms: hematochezia and nausea   Associated symptoms: no chest pain, no cough, no diarrhea, no dysuria, no fatigue, no fever, no hematuria, no shortness of breath and no vomiting   Rectal Bleeding Quality:  Bright red Amount:  Scant Duration:  6 months Timing:  Constant Progression:  Waxing and waning Chronicity:  Chronic Context: spontaneously   Similar prior episodes: yes   Relieved by:  Nothing Worsened by:  Nothing tried Ineffective treatments:  None tried Associated symptoms: no abdominal pain, no dizziness, no fever and no vomiting     Past Medical History  Diagnosis Date  . Anemia   . Asthma   . H/O varicella   . H/O candidiasis   . Irregular periods/menstrual cycles 07/01/09  . Hypertension    Past Surgical History  Procedure Laterality Date  . No past surgeries    . Cholecystectomy  03/20/2012    Procedure: LAPAROSCOPIC CHOLECYSTECTOMY;  Surgeon: Shelly Rubensteinouglas A Blackman, MD;  Location: [facility];  Service: General;  Laterality: N/A;  . Cholecystectomy, laparoscopic N/A 2013   Family History  Problem Relation Age of Onset  . Hypertension Mother   . Cancer Maternal Grandmother     breast  . Cancer Maternal Grandfather     colon  . Anesthesia problems Neg Hx    History  Substance Use Topics  . Smoking status: Never Smoker   . Smokeless tobacco: Never Used  . Alcohol Use: No   OB History    Gravida Para Term Preterm AB TAB SAB Ectopic Multiple Living   2 2 2  0 0 0 0 0 0 2     Review of Systems  Constitutional: Negative  for fever and fatigue.  HENT: Negative for congestion and drooling.   Eyes: Negative for pain.  Respiratory: Negative for cough and shortness of breath.   Cardiovascular: Negative for chest pain.  Gastrointestinal: Positive for nausea, blood in stool and hematochezia. Negative for vomiting, abdominal pain and diarrhea.  Genitourinary: Negative for dysuria and hematuria.  Musculoskeletal: Negative for back pain, gait problem and neck pain.  Skin: Negative for color change.  Neurological: Negative for dizziness and headaches.  Hematological: Negative for adenopathy.  Psychiatric/Behavioral: Negative for behavioral problems.  All other systems reviewed and are negative.     Allergies  Penicillins; Iodine; Latex; Shellfish allergy; and Tomato  Home Medications   Prior to Admission medications   Not on File   BP 127/64 mmHg  Pulse 92  Temp(Src) 97.9 F (36.6 C) (Oral)  Resp 18  SpO2 95%  LMP 06/23/2014 (Approximate) Physical Exam  Constitutional: She is oriented to person, place, and time. She appears well-developed and well-nourished.  HENT:  Head: Normocephalic.  Mouth/Throat: Oropharynx is clear and moist. No oropharyngeal exudate.  Eyes: Conjunctivae and EOM are normal. Pupils are equal, round, and reactive to light.  Neck: Normal range of motion. Neck supple.  Cardiovascular: Normal rate, regular rhythm, normal heart sounds and intact distal pulses.  Exam reveals no gallop and no friction rub.   No murmur heard. Pulmonary/Chest: Effort normal and breath sounds normal.  No respiratory distress. She has no wheezes.  Abdominal: Soft. Bowel sounds are normal. There is no tenderness. There is no rebound and no guarding.  Genitourinary:  Normal-appearing external rectum. Normal rectal exam. No gross blood noted.  Musculoskeletal: Normal range of motion. She exhibits no edema or tenderness.  Neurological: She is alert and oriented to person, place, and time.  Skin: Skin is warm  and dry.  Psychiatric: She has a normal mood and affect. Her behavior is normal.  Nursing note and vitals reviewed.   ED Course  Procedures (including critical care time) Labs Review Labs Reviewed  CBC WITH DIFFERENTIAL/PLATELET - Abnormal; Notable for the following:    Hemoglobin 11.8 (*)    MCH 24.8 (*)    All other components within normal limits  COMPREHENSIVE METABOLIC PANEL - Abnormal; Notable for the following:    Glucose, Bld 108 (*)    Total Protein 8.5 (*)    All other components within normal limits  URINALYSIS, ROUTINE W REFLEX MICROSCOPIC - Abnormal; Notable for the following:    APPearance CLOUDY (*)    All other components within normal limits  POC OCCULT BLOOD, ED - Abnormal; Notable for the following:    Fecal Occult Bld POSITIVE (*)    All other components within normal limits  LIPASE, BLOOD  OCCULT BLOOD X 1 CARD TO LAB, STOOL  POC URINE PREG, ED    Imaging Review No results found.   EKG Interpretation None      MDM   Final diagnoses:  Rectal bleeding    1:02 AM 24 y.o. female who presents with rectal bleeding. She states that she has seen red blood in her stool fairly consistently for the last 6 months. It does seem to wax and wane. She notes that she developed some queasiness in her stomach yesterday but denies any pain. She describes it as a mild nausea. Her abdomen is soft and benign without tenderness on my exam. She denies any GU symptoms. She is afebrile and her vital signs are unremarkable here. Her lab work is noncontributory. She has a normal rectal exam with brown appearing mucus. It was Hemoccult positive. Will recommend follow-up with GI and return for any worsening.  1:12 AM:  I have discussed the diagnosis/risks/treatment options with the patient and believe the pt to be eligible for discharge home to follow-up with [facility]. We also discussed returning to the ED immediately if new or worsening sx occur. We discussed the sx which are most  concerning (e.g., worsening bloody stools, abd pain, fever) that necessitate immediate return. Medications administered to the patient during their visit and any new prescriptions provided to the patient are listed below.  Medications given during this visit Medications  ondansetron (ZOFRAN-ODT) disintegrating tablet 8 mg (8 mg Oral Given 08/08/14 0000)    New Prescriptions   No medications on file      Purvis Sheffield, MD 08/08/14 0113

## 2015-01-15 ENCOUNTER — Emergency Department (HOSPITAL_COMMUNITY)
Admission: EM | Admit: 2015-01-15 | Discharge: 2015-01-16 | Disposition: A | Discharge status: Home / Self Care | Payer: Medicaid Other | Attending: Emergency Medicine | Admitting: Emergency Medicine

## 2015-01-15 DIAGNOSIS — I10 Essential (primary) hypertension: Secondary | ICD-10-CM | POA: Diagnosis not present

## 2015-01-15 DIAGNOSIS — Z91040 Latex allergy status: Secondary | ICD-10-CM | POA: Insufficient documentation

## 2015-01-15 DIAGNOSIS — Z8619 Personal history of other infectious and parasitic diseases: Secondary | ICD-10-CM | POA: Diagnosis not present

## 2015-01-15 DIAGNOSIS — Z862 Personal history of diseases of the blood and blood-forming organs and certain disorders involving the immune mechanism: Secondary | ICD-10-CM | POA: Diagnosis not present

## 2015-01-15 DIAGNOSIS — Z880 Allergy status to penicillin: Secondary | ICD-10-CM | POA: Diagnosis not present

## 2015-01-15 DIAGNOSIS — J45909 Unspecified asthma, uncomplicated: Secondary | ICD-10-CM | POA: Diagnosis not present

## 2015-01-15 DIAGNOSIS — Z3202 Encounter for pregnancy test, result negative: Secondary | ICD-10-CM | POA: Insufficient documentation

## 2015-01-15 DIAGNOSIS — N644 Mastodynia: Secondary | ICD-10-CM | POA: Diagnosis not present

## 2015-01-16 ENCOUNTER — Encounter (HOSPITAL_COMMUNITY): Payer: Self-pay

## 2015-01-16 ENCOUNTER — Other Ambulatory Visit: Payer: Self-pay

## 2015-01-16 DIAGNOSIS — R5381 Other malaise: Secondary | ICD-10-CM

## 2015-01-16 LAB — POC URINE PREG, ED: Preg Test, Ur: NEGATIVE

## 2015-01-16 NOTE — Discharge Instructions (Signed)
Pain of Unknown Etiology (Pain Without a Known Cause) You have come to your caregiver because of pain. Pain can occur in any part of the body. Often there is not a definite cause. If your laboratory (blood or urine) work was normal and X-rays or other studies were normal, your caregiver may treat you without knowing the cause of the pain. An example of this is the headache. Most headaches are diagnosed by taking a history. This means your caregiver asks you questions about your headaches. Your caregiver determines a treatment based on your answers. Usually testing done for headaches is normal. Often testing is not done unless there is no response to medications. Regardless of where your pain is located today, you can be given medications to make you comfortable. If no physical cause of pain can be found, most cases of pain will gradually leave as suddenly as they came.  If you have a painful condition and no reason can be found for the pain, it is important that you follow up with your caregiver. If the pain becomes worse or does not go away, it may be necessary to repeat tests and look further for a possible cause.  Only take over-the-counter or prescription medicines for pain, discomfort, or fever as directed by your caregiver.  For the protection of your privacy, test results cannot be given over the phone. Make sure you receive the results of your test. Ask how these results are to be obtained if you have not been informed. It is your responsibility to obtain your test results.  You may continue all activities unless the activities cause more pain. When the pain lessens, it is important to gradually resume normal activities. Resume activities by beginning slowly and gradually increasing the intensity and duration of the activities or exercise. During periods of severe pain, bed rest may be helpful. Lie or sit in any position that is comfortable.  Ice used for acute (sudden) conditions may be effective.  Use a large plastic bag filled with ice and wrapped in a towel. This may provide pain relief.  See your caregiver for continued problems. Your caregiver can help or refer you for exercises or physical therapy if necessary. If you were given medications for your condition, do not drive, operate machinery or power tools, or sign legal documents for 24 hours. Do not drink alcohol, take sleeping pills, or take other medications that may interfere with treatment. See your caregiver immediately if you have pain that is becoming worse and not relieved by medications. Document Released: 01/18/2001 Document Revised: 02/13/2013 Document Reviewed: 04/25/2005 [facility] Patient Information 2015 [place], [place]. This information is not intended to replace advice given to you by your health care provider. Make sure you discuss any questions you have with your health care provider. i did not feel a discrete mass today.  You have no sign of an abscess.  He had no nipple discharge, please call the breast center.  Schedule mammogram take over-the-counter Tylenol or ibuprofen for discomfort.  There are some various other suggestions in the discharge material given to you.

## 2015-01-16 NOTE — ED Provider Notes (Signed)
CSN: [id number]     Arrival date & time 01/15/15  2343 History   First MD Initiated Contact with Patient 01/16/15 0013     Chief Complaint  Patient presents with  . Breast Pain     (Consider location/radiation/quality/duration/timing/severity/associated sxs/prior Treatment) HPI Comments: Patient has noted right breast tenderness for the approximately 2 days  She is due to have her menstrual cycle start in approximately one week.  He normally does not get tenderness with her menstrual cycle.  She does have a family history of breast cancer.  She is very concerned about this.  Denies any change in the contour of the breast.  No trauma to the breast.  No nipple discharge  The history is provided by the patient.    Past Medical History  Diagnosis Date  . Anemia   . Asthma   . H/O varicella   . H/O candidiasis   . Irregular periods/menstrual cycles 07/01/09  . Hypertension    Past Surgical History  Procedure Laterality Date  . No past surgeries    . Cholecystectomy  03/20/2012    Procedure: LAPAROSCOPIC CHOLECYSTECTOMY;  Surgeon: Shelly Rubenstein, MD;  Location: [facility];  Service: General;  Laterality: N/A;  . Cholecystectomy, laparoscopic N/A 2013   Family History  Problem Relation Age of Onset  . Hypertension Mother   . Cancer Maternal Grandmother     breast  . Cancer Maternal Grandfather     colon  . Anesthesia problems Neg Hx    Social History  Substance Use Topics  . Smoking status: Never Smoker   . Smokeless tobacco: Never Used  . Alcohol Use: No   OB History    Gravida Para Term Preterm AB TAB SAB Ectopic Multiple Living   0 0 0 0 0 0 2     Review of Systems  Constitutional: Negative for fever.  Respiratory: Negative for cough.   Genitourinary: Negative for dysuria, frequency, decreased urine volume, vaginal bleeding and vaginal discharge.  Skin: Negative for rash and wound.  Neurological: Negative for dizziness and facial asymmetry.  All other systems  reviewed and are negative.     Allergies  Penicillins; Iodine; Latex; Shellfish allergy; and Tomato  Home Medications   Prior to Admission medications   Not on File   BP 139/67 mmHg  Pulse 72  Temp(Src) 98.1 F (36.7 C) (Oral)  Resp 20  SpO2 97%  LMP 12/29/2014 Physical Exam  Constitutional: She appears well-developed and well-nourished.  HENT:  Head: Normocephalic.  Eyes: Pupils are equal, round, and reactive to light.  Neck: Normal range of motion.  Cardiovascular: Normal rate.   Pulmonary/Chest: Effort normal. Right breast exhibits tenderness. Right breast exhibits no inverted nipple, no mass, no nipple discharge and no skin change. Breasts are symmetrical.    Nursing note and vitals reviewed.   ED Course  Procedures (including critical care time) Labs Review Labs Reviewed - No data to display  Imaging Review No results found. I have personally reviewed and evaluated these images and lab results as part of my medical decision-making.   EKG Interpretation None    pregnancy  test is negative Patient has been referred to the breast center for a mammogram  MDM   Final diagnoses:  Breast pain         Earley Favor, NP 01/16/15 0034  April Palumbo, MD 01/16/15 [phone]

## 2015-01-16 NOTE — ED Notes (Signed)
Pt complains of a painful lump in her right breast for two days

## 2015-01-18 ENCOUNTER — Emergency Department (HOSPITAL_COMMUNITY)
Admission: EM | Admit: 2015-01-18 | Discharge: 2015-01-18 | Discharge status: Against Medical Advice | Payer: Medicaid Other | Attending: Emergency Medicine | Admitting: Emergency Medicine

## 2015-01-18 ENCOUNTER — Encounter (HOSPITAL_COMMUNITY): Payer: Self-pay | Admitting: *Deleted

## 2015-01-18 DIAGNOSIS — I10 Essential (primary) hypertension: Secondary | ICD-10-CM | POA: Insufficient documentation

## 2015-01-18 DIAGNOSIS — J45909 Unspecified asthma, uncomplicated: Secondary | ICD-10-CM | POA: Diagnosis not present

## 2015-01-18 DIAGNOSIS — N63 Unspecified lump in breast: Secondary | ICD-10-CM | POA: Insufficient documentation

## 2015-01-18 NOTE — ED Notes (Signed)
The pt is c/o a mass in her rt breast for 2-3 days.  No known injury.  lmp  Mid-aug

## 2015-01-18 NOTE — ED Notes (Signed)
thge pt was seen at [facility] 9[facility]8 for the same  complaint

## 2015-01-18 NOTE — ED Notes (Signed)
Pt and mother decided not to wait they will return later

## 2015-07-08 ENCOUNTER — Emergency Department (INDEPENDENT_AMBULATORY_CARE_PROVIDER_SITE_OTHER)
Admission: EM | Admit: 2015-07-08 | Discharge: 2015-07-08 | Disposition: A | Discharge status: Home / Self Care | Payer: Self-pay | Source: Home / Self Care | Attending: Family Medicine | Admitting: Family Medicine

## 2015-07-08 ENCOUNTER — Encounter (HOSPITAL_COMMUNITY): Payer: Self-pay | Admitting: Emergency Medicine

## 2015-07-08 DIAGNOSIS — R0789 Other chest pain: Secondary | ICD-10-CM

## 2015-07-08 DIAGNOSIS — M940 Chondrocostal junction syndrome [Tietze]: Secondary | ICD-10-CM

## 2015-07-08 DIAGNOSIS — M62838 Other muscle spasm: Secondary | ICD-10-CM

## 2015-07-08 NOTE — Discharge Instructions (Signed)
Chest Wall Pain °Chest wall pain is pain in or around the bones and muscles of your chest. Sometimes, an injury causes this pain. Sometimes, the cause may not be known. This pain may take several weeks or longer to get better. °HOME CARE INSTRUCTIONS  °Pay attention to any changes in your symptoms. Take these actions to help with your pain:  °· Rest as told by your health care provider.   °· Avoid activities that cause pain. These include any activities that use your chest muscles or your abdominal and side muscles to lift heavy items.    °· If directed, apply ice to the painful area: °· Put ice in a plastic bag. °· Place a towel between your skin and the bag. °· Leave the ice on for 20 minutes, 2-3 times per day. °· Take over-the-counter and prescription medicines only as told by your health care provider. °· Do not use tobacco products, including cigarettes, chewing tobacco, and e-cigarettes. If you need help quitting, ask your health care provider. °· Keep all follow-up visits as told by your health care provider. This is important. °SEEK MEDICAL CARE IF: °· You have a fever. °· Your chest pain becomes worse. °· You have new symptoms. °SEEK IMMEDIATE MEDICAL CARE IF: °· You have nausea or vomiting. °· You feel sweaty or light-headed. °· You have a cough with phlegm (sputum) or you cough up blood. °· You develop shortness of breath. °  °This information is not intended to replace advice given to you by your health care provider. Make sure you discuss any questions you have with your health care provider. °  °Document Released: 04/25/2005 Document Revised: 01/14/2015 Document Reviewed: 07/21/2014 °Elsevier Interactive Patient Education ©2016 [employer] ° °Costochondritis °Costochondritis, sometimes called Tietze syndrome, is a swelling and irritation (inflammation) of the tissue (cartilage) that connects your ribs with your breastbone (sternum). It causes pain in the chest and rib area. Costochondritis usually  goes away on its own over time. It can take up to 6 weeks or longer to get better, especially if you are unable to limit your activities. °CAUSES  °Some cases of costochondritis have no known cause. Possible causes include: °· Injury (trauma). °· Exercise or activity such as lifting. °· Severe coughing. °SIGNS AND SYMPTOMS °· Pain and tenderness in the chest and rib area. °· Pain that gets worse when coughing or taking deep breaths. °· Pain that gets worse with specific movements. °DIAGNOSIS  °Your health care provider will do a physical exam and ask about your symptoms. Chest X-rays or other tests may be done to rule out other problems. °TREATMENT  °Costochondritis usually goes away on its own over time. Your health care provider may prescribe medicine to help relieve pain. °HOME CARE INSTRUCTIONS  °· Avoid exhausting physical activity. Try not to strain your ribs during normal activity. This would include any activities using chest, abdominal, and side muscles, especially if heavy weights are used. °· Apply ice to the affected area for the first 2 days after the pain begins. °¨ Put ice in a plastic bag. °¨ Place a towel between your skin and the bag. °¨ Leave the ice on for 20 minutes, 2-3 times a day. °· Only take over-the-counter or prescription medicines as directed by your health care provider. °SEEK MEDICAL CARE IF: °· You have redness or swelling at the rib joints. These are signs of infection. °· Your pain does not go away despite rest or medicine. °SEEK IMMEDIATE MEDICAL CARE IF:  °· Your pain   increases or you are very uncomfortable. °· You have shortness of breath or difficulty breathing. °· You cough up blood. °· You have worse chest pains, sweating, or vomiting. °· You have a fever or persistent symptoms for more than 2-3 days. °· You have a fever and your symptoms suddenly get worse. °MAKE SURE YOU:  °· Understand these instructions. °· Will watch your condition. °· Will get help right away if you are  not doing well or get worse. °  °This information is not intended to replace advice given to you by your health care provider. Make sure you discuss any questions you have with your health care provider. °  °Document Released: 02/02/2005 Document Revised: 02/13/2013 Document Reviewed: 11/27/2012 °Elsevier Interactive Patient Education ©2016 [employer] ° °

## 2015-07-08 NOTE — ED Notes (Signed)
Pt complains of mid chest tightness for one month that she was getting every couple of days for a few hours.  Yesterday she woke up with it and it lasted all day.  She reports palpitations with deep inspiration and says she feels the palpitations at rest.  Pt states she was feeling them while I was assessing her.  Pt's HR was 72, with equal 12 beats per 10 seconds for one minute.  I was able to reproduce the pain upon palpations.  Pt is A&O w/ VSS and is in no apparent distress at this time.

## 2015-07-08 NOTE — ED Provider Notes (Signed)
CSN: [id number]     Arrival date & time 07/08/15  1745 History   First MD Initiated Contact with Patient 07/08/15 1828     Chief Complaint  Patient presents with  . Chest Pain  . Palpitations   (Consider location/radiation/quality/duration/timing/severity/associated sxs/prior Treatment) HPI Comments: 25 year old morbidly obese female complaining of chest pain for one month. Initially it started out as intermittent occurring either daily or every few days. She describes it as a tight feeling. It is located along the para sternal borders. It is nonradiating. It is exacerbated by turning and twisting of the torso and taking a deep breath.  The patient also describes twitching or fluttering in the same area. This often occurs along with the pain. It is sometimes elicited with taking a deep breath and movement as well.  Patient is a 25 y.o. female presenting with chest pain and palpitations.  Chest Pain Associated symptoms: palpitations   Associated symptoms: no cough, no fatigue, no fever and no shortness of breath   Palpitations Associated symptoms: chest pain   Associated symptoms: no cough and no shortness of breath     Past Medical History  Diagnosis Date  . Anemia   . Asthma   . H/O varicella   . H/O candidiasis   . Irregular periods/menstrual cycles 07/01/09  . Hypertension    Past Surgical History  Procedure Laterality Date  . No past surgeries    . Cholecystectomy  03/20/2012    Procedure: LAPAROSCOPIC CHOLECYSTECTOMY;  Surgeon: Shelly Rubenstein, MD;  Location: [facility];  Service: General;  Laterality: N/A;  . Cholecystectomy, laparoscopic N/A 2013   Family History  Problem Relation Age of Onset  . Hypertension Mother   . Cancer Maternal Grandmother     breast  . Cancer Maternal Grandfather     colon  . Anesthesia problems Neg Hx    Social History  Substance Use Topics  . Smoking status: Never Smoker   . Smokeless tobacco: Never Used  . Alcohol Use: No   OB  History    Gravida Para Term Preterm AB TAB SAB Ectopic Multiple Living   0 0 0 0 0 0 2     Review of Systems  Constitutional: Negative for fever, activity change and fatigue.  HENT: Negative.   Respiratory: Negative for cough and shortness of breath.   Cardiovascular: Positive for chest pain and palpitations.  Gastrointestinal: Negative.   Musculoskeletal: Negative.   Skin: Negative.   Neurological: Negative.     Allergies  Penicillins; Iodine; Latex; Shellfish allergy; and Tomato  Home Medications   Prior to Admission medications   Not on File   Meds Ordered and Administered this Visit  Medications - No data to display  BP 114/62 mmHg  Pulse 72  Temp(Src) 99.5 F (37.5 C) (Oral)  Resp 18  SpO2 99%  LMP 06/13/2015 (Within Days) No data found.   Physical Exam  Constitutional: She is oriented to person, place, and time. She appears well-developed and well-nourished. No distress.  Eyes: EOM are normal.  Neck: Normal range of motion. Neck supple.  Cardiovascular: Normal rate, regular rhythm, normal heart sounds and intact distal pulses.  Exam reveals no gallop and no friction rub.   No murmur heard. Pulmonary/Chest: Effort normal and breath sounds normal. No respiratory distress. She has no wheezes. She has no rales. She exhibits tenderness.  Reproducible chest wall tenderness along the bilateral sternal borders. Patient states this is the exact pain for which she  has been experiencing for the past month.  Musculoskeletal: Normal range of motion. She exhibits no edema.  Neurological: She is alert and oriented to person, place, and time. She exhibits normal muscle tone.  Skin: Skin is warm and dry.  Psychiatric: She has a normal mood and affect.  Nursing note and vitals reviewed.   ED Course  Procedures (including critical care time)  Labs Review Labs Reviewed - No data to display  Imaging Review No results found.   Visual Acuity Review  Right Eye  Distance:   Left Eye Distance:   Bilateral Distance:    Right Eye Near:   Left Eye Near:    Bilateral Near:         MDM   1. Chest wall pain   2. Costochondritis   3. Muscle spasm    Patient has no cardiopulmonary history although she did smoke. The chest wall pain is reproducible. She describes this fluttering feeling adjacent to the site of the chest wall pain. Is a fleeting sharp type fluttering likely associated with some muscle spasms associated with the chest wall. Auscultation of the heart as well as palpation of the pulse for 2 minutes reveals a normal sinus rhythm without ectopy. Ice to the chest periodically. Ibuprofen as needed. Limit activity that exacerbates pain.    Hayden Rasmussen, NP 07/08/15 [phone]

## 2015-08-28 ENCOUNTER — Emergency Department (HOSPITAL_BASED_OUTPATIENT_CLINIC_OR_DEPARTMENT_OTHER)
Admission: EM | Admit: 2015-08-28 | Discharge: 2015-08-28 | Disposition: A | Discharge status: Home / Self Care | Payer: Self-pay | Attending: Emergency Medicine | Admitting: Emergency Medicine

## 2015-08-28 ENCOUNTER — Encounter (HOSPITAL_BASED_OUTPATIENT_CLINIC_OR_DEPARTMENT_OTHER): Payer: Self-pay | Admitting: *Deleted

## 2015-08-28 ENCOUNTER — Emergency Department (HOSPITAL_BASED_OUTPATIENT_CLINIC_OR_DEPARTMENT_OTHER): Payer: Self-pay

## 2015-08-28 DIAGNOSIS — Z8619 Personal history of other infectious and parasitic diseases: Secondary | ICD-10-CM | POA: Insufficient documentation

## 2015-08-28 DIAGNOSIS — R071 Chest pain on breathing: Secondary | ICD-10-CM

## 2015-08-28 DIAGNOSIS — I10 Essential (primary) hypertension: Secondary | ICD-10-CM | POA: Insufficient documentation

## 2015-08-28 DIAGNOSIS — Z880 Allergy status to penicillin: Secondary | ICD-10-CM | POA: Insufficient documentation

## 2015-08-28 DIAGNOSIS — Z8742 Personal history of other diseases of the female genital tract: Secondary | ICD-10-CM | POA: Insufficient documentation

## 2015-08-28 DIAGNOSIS — M940 Chondrocostal junction syndrome [Tietze]: Secondary | ICD-10-CM | POA: Insufficient documentation

## 2015-08-28 DIAGNOSIS — J45909 Unspecified asthma, uncomplicated: Secondary | ICD-10-CM | POA: Insufficient documentation

## 2015-08-28 DIAGNOSIS — Z91040 Latex allergy status: Secondary | ICD-10-CM | POA: Insufficient documentation

## 2015-08-28 DIAGNOSIS — R0789 Other chest pain: Secondary | ICD-10-CM

## 2015-08-28 DIAGNOSIS — Z862 Personal history of diseases of the blood and blood-forming organs and certain disorders involving the immune mechanism: Secondary | ICD-10-CM | POA: Insufficient documentation

## 2015-08-28 IMAGING — DX DG CHEST 2V
2 series · 2 of 2 positions shown · non-contrast
Comparison: None.

CLINICAL DATA: Chest pain for 6 weeks.

EXAM:
CHEST  2 VIEW

[chest pa]
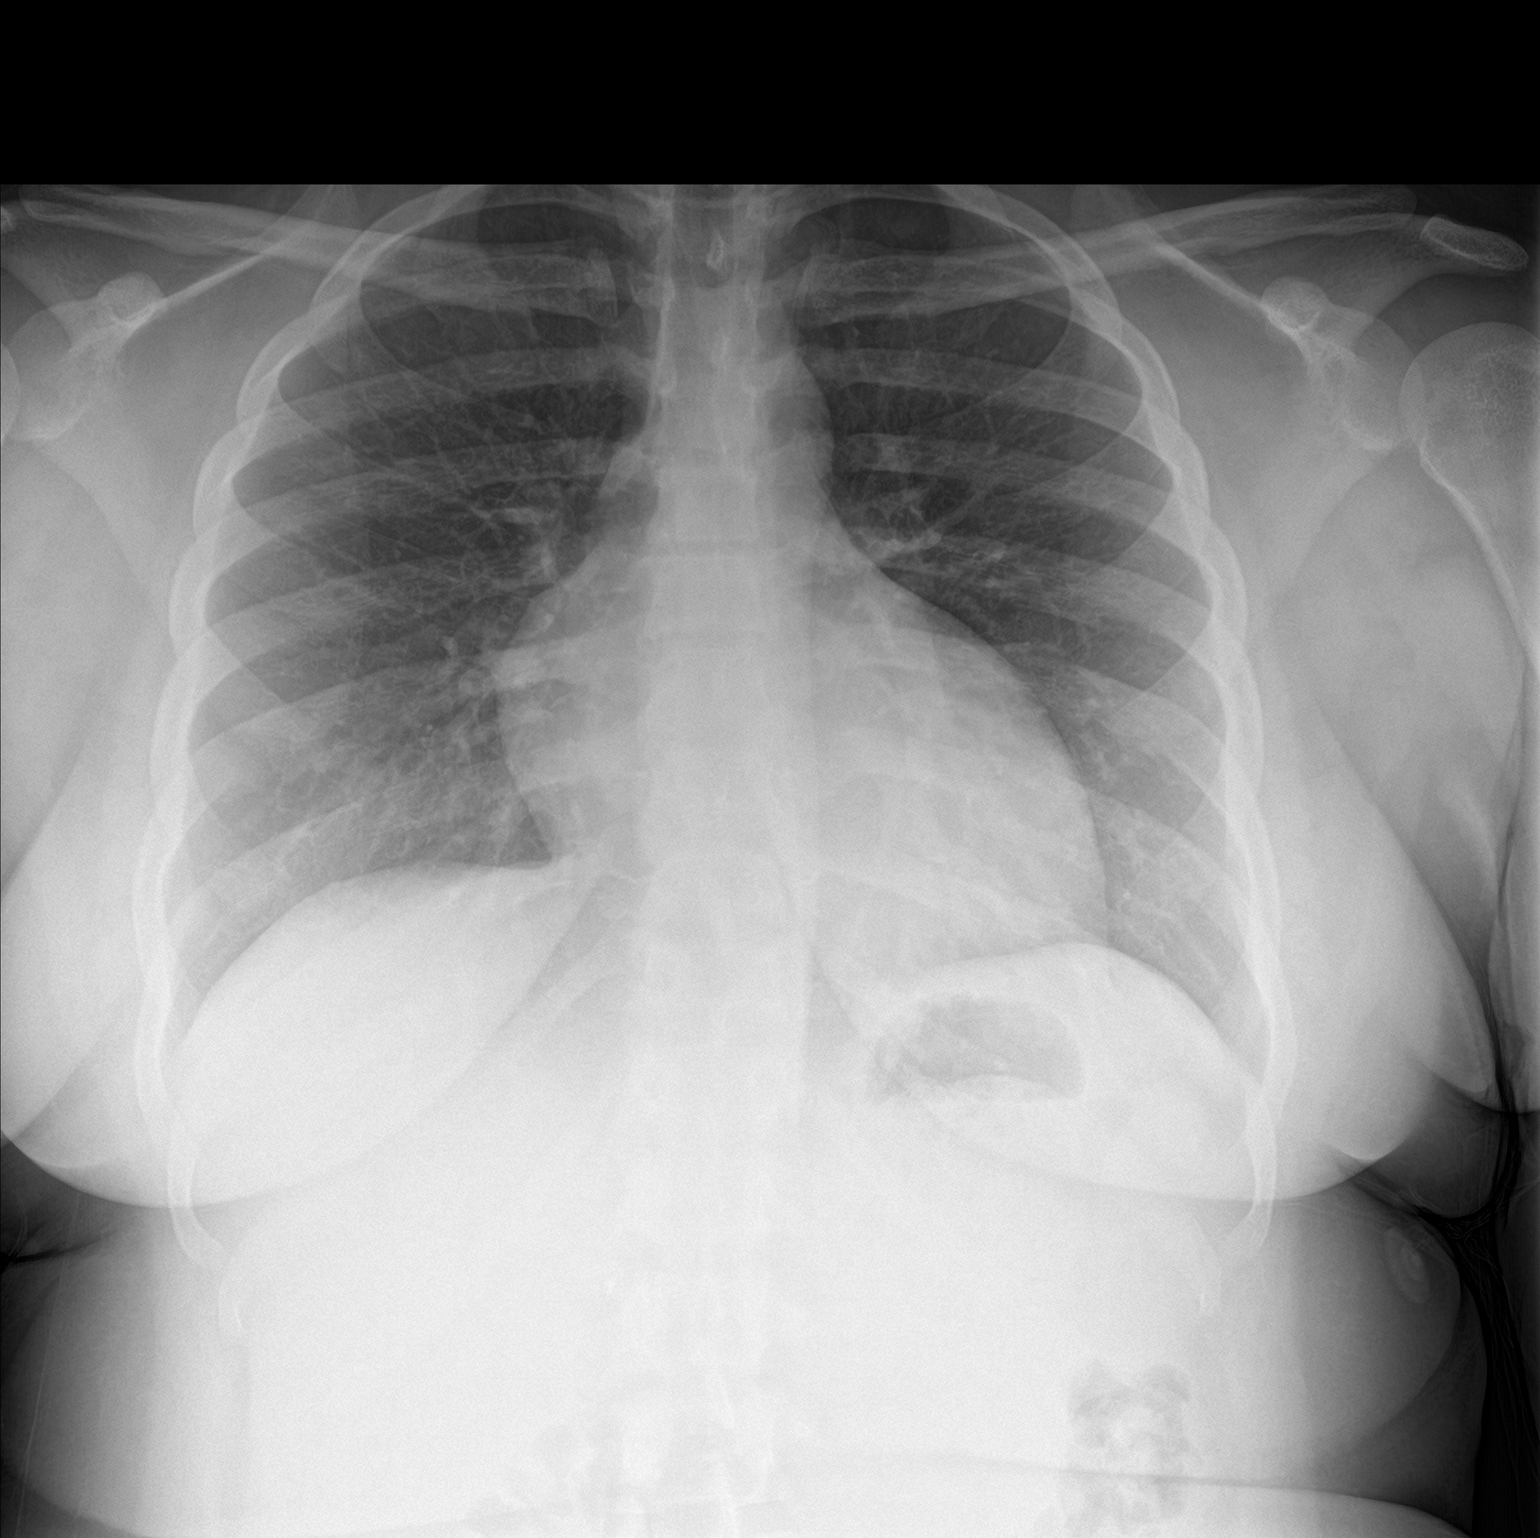

[chest lat]
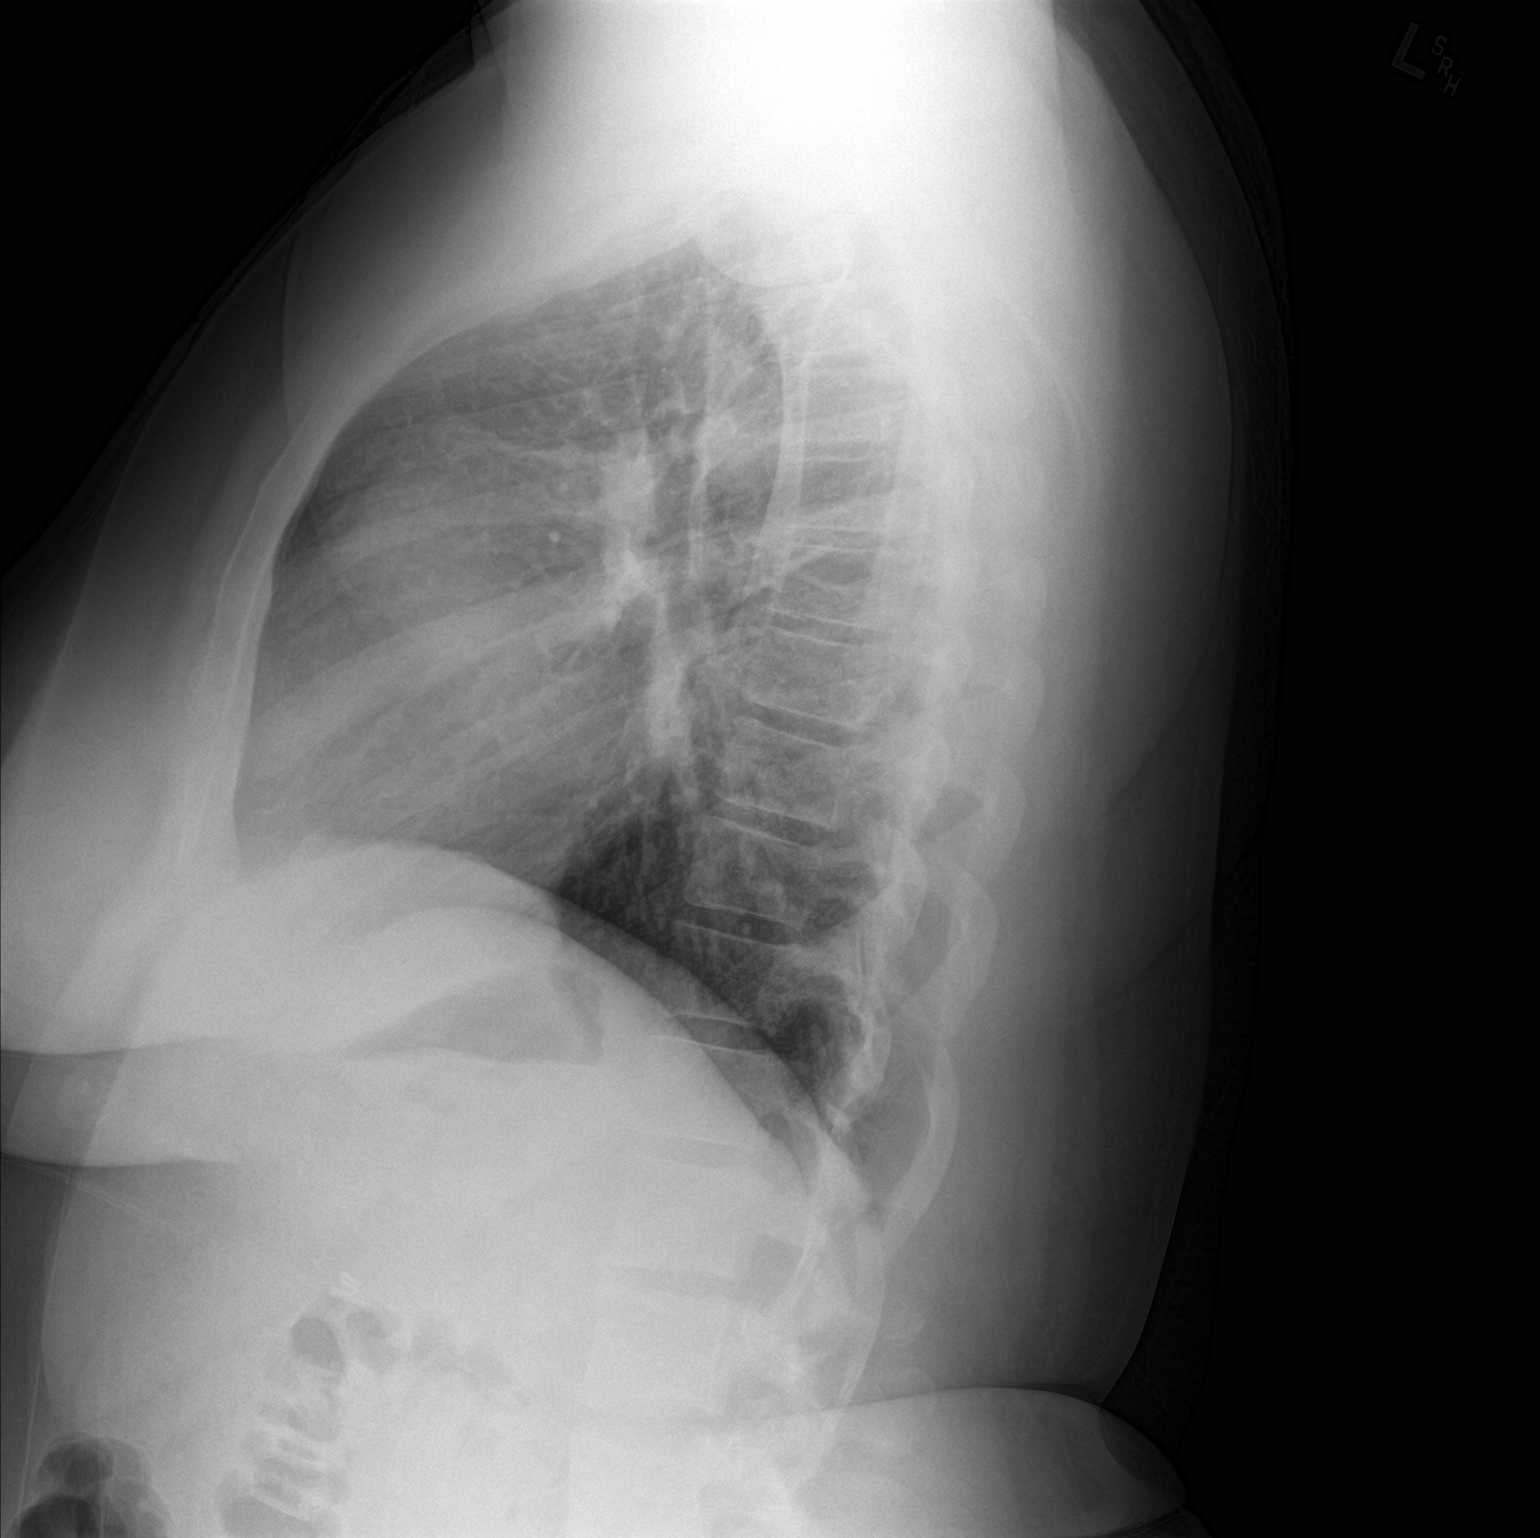

[2 of 2 positions shown; findings below may reference images not displayed]

FINDINGS: Mild enlargement of the cardiopericardial silhouette, without edema.
The lungs appear clear. No pleural effusion. Otherwise unremarkable.
IMPRESSION: 1. Mild enlargement of the cardiopericardial silhouette, abnormal
for age. Correlate with cardiac auscultation in determining the need
for echocardiography.

## 2015-08-28 NOTE — ED Provider Notes (Signed)
CSN: [id number]     Arrival date & time 08/28/15  1340 History   First MD Initiated Contact with Patient 08/28/15 1408     Chief Complaint  Patient presents with  . Chest Pain    HPI   25 year old female presents today with complaints of chest pain. Patient reports proximal with 6 months of anterior chest pain. She reports the symptoms coming ago, usually daily. She describes them as soreness to her sternum, worse with palpation, not made worse with deep inspiration. She reports pain is worsened with lifting heavy objects, she denies any sure she didn't shortness of breath, dizziness, lightheadedness, nausea, vomiting, abdominal pain, cough, fever, chills. Patient reports she was seen in the emergency room for this prior with no significant findings and diagnosed with musculoskeletal pain. Patient reports symptoms are improved with doses of ibuprofen, but not completely resolved. Patient denies any cardiac history, she denies any significant risk factors for ACS, PE. No lower extremity swelling or edema.   Past Medical History  Diagnosis Date  . Anemia   . Asthma   . H/O varicella   . H/O candidiasis   . Irregular periods/menstrual cycles 07/01/09  . Hypertension    Past Surgical History  Procedure Laterality Date  . No past surgeries    . Cholecystectomy  03/20/2012    Procedure: LAPAROSCOPIC CHOLECYSTECTOMY;  Surgeon: Shelly Rubensteinouglas A Blackman, MD;  Location: [facility];  Service: General;  Laterality: N/A;  . Cholecystectomy, laparoscopic N/A 2013   Family History  Problem Relation Age of Onset  . Hypertension Mother   . Cancer Maternal Grandmother     breast  . Cancer Maternal Grandfather     colon  . Anesthesia problems Neg Hx    Social History  Substance Use Topics  . Smoking status: Never Smoker   . Smokeless tobacco: Never Used  . Alcohol Use: No   OB History    Gravida Para Term Preterm AB TAB SAB Ectopic Multiple Living   2 2 2  0 0 0 0 0 0 2     Review of Systems  All  other systems reviewed and are negative.   Allergies  Penicillins; Iodine; Latex; Shellfish allergy; and Tomato  Home Medications   Prior to Admission medications   Not on File   BP 127/76 mmHg  Pulse 82  Temp(Src) 99 F (37.2 C) (Oral)  Resp 15  Ht 5\' 5"  (1.651 m)  Wt 135.172 kg  BMI 49.59 kg/m2  SpO2 100%  LMP 08/07/2015   Physical Exam  Constitutional: She is oriented to person, place, and time. She appears well-developed and well-nourished.  HENT:  Head: Normocephalic and atraumatic.  Eyes: Conjunctivae are normal. Pupils are equal, round, and reactive to light. Right eye exhibits no discharge. Left eye exhibits no discharge. No scleral icterus.  Neck: Normal range of motion. No JVD present. No tracheal deviation present.  Cardiovascular: Normal rate, regular rhythm, normal heart sounds and intact distal pulses.  Exam reveals no gallop and no friction rub.   No murmur heard. Pulmonary/Chest: Effort normal and breath sounds normal. No stridor. No respiratory distress. She has no wheezes. She has no rales. She exhibits tenderness.  Tenderness to palpation of the sternum  Abdominal: She exhibits no distension and no mass. There is no tenderness. There is no rebound and no guarding.  Musculoskeletal: Normal range of motion. She exhibits no edema or tenderness.  Neurological: She is alert and oriented to person, place, and time. Coordination normal.  Skin:  No rash noted.  Psychiatric: She has a normal mood and affect. Her behavior is normal. Judgment and thought content normal.  Nursing note and vitals reviewed.   ED Course  Procedures (including critical care time) Labs Review Labs Reviewed - No data to display  Imaging Review Dg Chest 2 View  08/28/2015  CLINICAL DATA:  Chest pain for 6 weeks. EXAM: CHEST  2 VIEW COMPARISON:  None. FINDINGS: Mild enlargement of the cardiopericardial silhouette, without edema. The lungs appear clear. No pleural effusion. Otherwise  unremarkable. IMPRESSION: 1. Mild enlargement of the cardiopericardial silhouette, abnormal for age. Correlate with cardiac auscultation in determining the need for echocardiography. Electronically Signed   By: Gaylyn Rong M.D.   On: 08/28/2015 14:49   I have personally reviewed and evaluated these images and lab results as part of my medical decision-making.   EKG Interpretation   Date/Time:  Friday August 28 2015 13:53:52 EDT Ventricular Rate:  79 PR Interval:  167 QRS Duration: 92 QT Interval:  368 QTC Calculation: 422 R Axis:   44 Text Interpretation:  Sinus rhythm No significant change since last  tracing Confirmed by KNAPP  MD-J, JON ([id number]) on 08/28/2015 1:57:08 PM Also  confirmed by KNAPP  MD-J, JON [phone]), editor [place] [facility], Jola Babinski [phone])   on 08/28/2015 3:04:20 PM      MDM   Final diagnoses:  Costochondral chest pain    Labs:  Imaging: DG chest 2 view  Consults:  Therapeutics:  Discharge Meds:   Assessment/Plan: 25 year old female presents today with complaints of chest pain. This is reproducible on exam, and the last 6 months. Patient's chest x-ray shows mild enlargement of the cardiopericardial silhouette. Due to patient's reproducible chest pain, no other findings on exam she will be instructed to follow-up with cardiology for further evaluation and management. She is given strict return precautions, instructed use ibuprofen as needed for discomfort. Patient verbalized understanding and agreement today's plan had no further questions or concerns at the time discharge        Eyvonne Mechanic, PA-C 08/28/15 1720  Linwood Dibbles, MD 09/03/15 [phone]

## 2015-08-28 NOTE — ED Notes (Signed)
Chest pain x 6 months. She was [facility] a month ago for same and had a negative exam.

## 2015-08-28 NOTE — ED Notes (Signed)
Family at bedside. 

## 2015-08-28 NOTE — Discharge Instructions (Signed)
Please follow-up with cardiologist for further evaluation and management. Please return to emergency room immediately if any new or worsening signs or symptoms present Costochondritis Costochondritis, sometimes called Tietze syndrome, is a swelling and irritation (inflammation) of the tissue (cartilage) that connects your ribs with your breastbone (sternum). It causes pain in the chest and rib area. Costochondritis usually goes away on its own over time. It can take up to 6 weeks or longer to get better, especially if you are unable to limit your activities. CAUSES  Some cases of costochondritis have no known cause. Possible causes include:  Injury (trauma).  Exercise or activity such as lifting.  Severe coughing. SIGNS AND SYMPTOMS  Pain and tenderness in the chest and rib area.  Pain that gets worse when coughing or taking deep breaths.  Pain that gets worse with specific movements. DIAGNOSIS  Your health care provider will do a physical exam and ask about your symptoms. Chest X-rays or other tests may be done to rule out other problems. TREATMENT  Costochondritis usually goes away on its own over time. Your health care provider may prescribe medicine to help relieve pain. HOME CARE INSTRUCTIONS   Avoid exhausting physical activity. Try not to strain your ribs during normal activity. This would include any activities using chest, abdominal, and side muscles, especially if heavy weights are used.  Apply ice to the affected area for the first 2 days after the pain begins.  Put ice in a plastic bag.  Place a towel between your skin and the bag.  Leave the ice on for 20 minutes, 2-3 times a day.  Only take over-the-counter or prescription medicines as directed by your health care provider. SEEK MEDICAL CARE IF:  You have redness or swelling at the rib joints. These are signs of infection.  Your pain does not go away despite rest or medicine. SEEK IMMEDIATE MEDICAL CARE IF:    Your pain increases or you are very uncomfortable.  You have shortness of breath or difficulty breathing.  You cough up blood.  You have worse chest pains, sweating, or vomiting.  You have a fever or persistent symptoms for more than 2-3 days.  You have a fever and your symptoms suddenly get worse. MAKE SURE YOU:   Understand these instructions.  Will watch your condition.  Will get help right away if you are not doing well or get worse.   This information is not intended to replace advice given to you by your health care provider. Make sure you discuss any questions you have with your health care provider.   Document Released: 02/02/2005 Document Revised: 02/13/2013 Document Reviewed: 11/27/2012 Elsevier Interactive Patient Education [employer]2016 [employer] Inc[employer]

## 2015-09-01 DIAGNOSIS — Z880 Allergy status to penicillin: Secondary | ICD-10-CM | POA: Insufficient documentation

## 2015-09-01 DIAGNOSIS — Z791 Long term (current) use of non-steroidal anti-inflammatories (NSAID): Secondary | ICD-10-CM | POA: Insufficient documentation

## 2015-09-01 DIAGNOSIS — R0789 Other chest pain: Secondary | ICD-10-CM | POA: Insufficient documentation

## 2015-09-01 DIAGNOSIS — Z8619 Personal history of other infectious and parasitic diseases: Secondary | ICD-10-CM | POA: Insufficient documentation

## 2015-09-01 DIAGNOSIS — Z91040 Latex allergy status: Secondary | ICD-10-CM | POA: Insufficient documentation

## 2015-09-01 DIAGNOSIS — J45909 Unspecified asthma, uncomplicated: Secondary | ICD-10-CM | POA: Insufficient documentation

## 2015-09-01 DIAGNOSIS — Z862 Personal history of diseases of the blood and blood-forming organs and certain disorders involving the immune mechanism: Secondary | ICD-10-CM | POA: Insufficient documentation

## 2015-09-01 DIAGNOSIS — I10 Essential (primary) hypertension: Secondary | ICD-10-CM | POA: Insufficient documentation

## 2015-09-01 DIAGNOSIS — Z8742 Personal history of other diseases of the female genital tract: Secondary | ICD-10-CM | POA: Insufficient documentation

## 2015-09-02 ENCOUNTER — Emergency Department (HOSPITAL_COMMUNITY): Payer: Medicaid Other

## 2015-09-02 ENCOUNTER — Encounter (HOSPITAL_COMMUNITY): Payer: Self-pay | Admitting: Emergency Medicine

## 2015-09-02 ENCOUNTER — Emergency Department (HOSPITAL_COMMUNITY)
Admission: EM | Admit: 2015-09-02 | Discharge: 2015-09-02 | Disposition: A | Discharge status: Home / Self Care | Payer: Medicaid Other | Attending: Emergency Medicine | Admitting: Emergency Medicine

## 2015-09-02 DIAGNOSIS — R0789 Other chest pain: Secondary | ICD-10-CM

## 2015-09-02 LAB — BASIC METABOLIC PANEL
ANION GAP: 9 (ref 5–15)
BUN: 15 mg/dL (ref 6–20)
CO2: 24 mmol/L (ref 22–32)
Calcium: 9.2 mg/dL (ref 8.9–10.3)
Chloride: 105 mmol/L (ref 101–111)
Creatinine, Ser: 0.68 mg/dL (ref 0.44–1.00)
Glucose, Bld: 98 mg/dL (ref 65–99)
POTASSIUM: 3.9 mmol/L (ref 3.5–5.1)
SODIUM: 138 mmol/L (ref 135–145)

## 2015-09-02 LAB — CBC
HEMATOCRIT: 35.6 % — AB (ref 36.0–46.0)
HEMOGLOBIN: 11.1 g/dL — AB (ref 12.0–15.0)
MCH: 24.9 pg — ABNORMAL LOW (ref 26.0–34.0)
MCHC: 31.2 g/dL (ref 30.0–36.0)
MCV: 79.8 fL (ref 78.0–100.0)
Platelets: 300 10*3/uL (ref 150–400)
RBC: 4.46 MIL/uL (ref 3.87–5.11)
RDW: 14.6 % (ref 11.5–15.5)
WBC: 9.3 10*3/uL (ref 4.0–10.5)

## 2015-09-02 LAB — I-STAT TROPONIN, ED: TROPONIN I, POC: 0 ng/mL (ref 0.00–0.08)

## 2015-09-02 IMAGING — DX DG CHEST 2V
2 series · 2 of 2 positions shown · non-contrast
Comparison: Prior radiograph from [DATE].

CLINICAL DATA: Initial evaluation for 7 months of chest pain,
worsened tonight.

EXAM:
CHEST  2 VIEW

[chest pa]
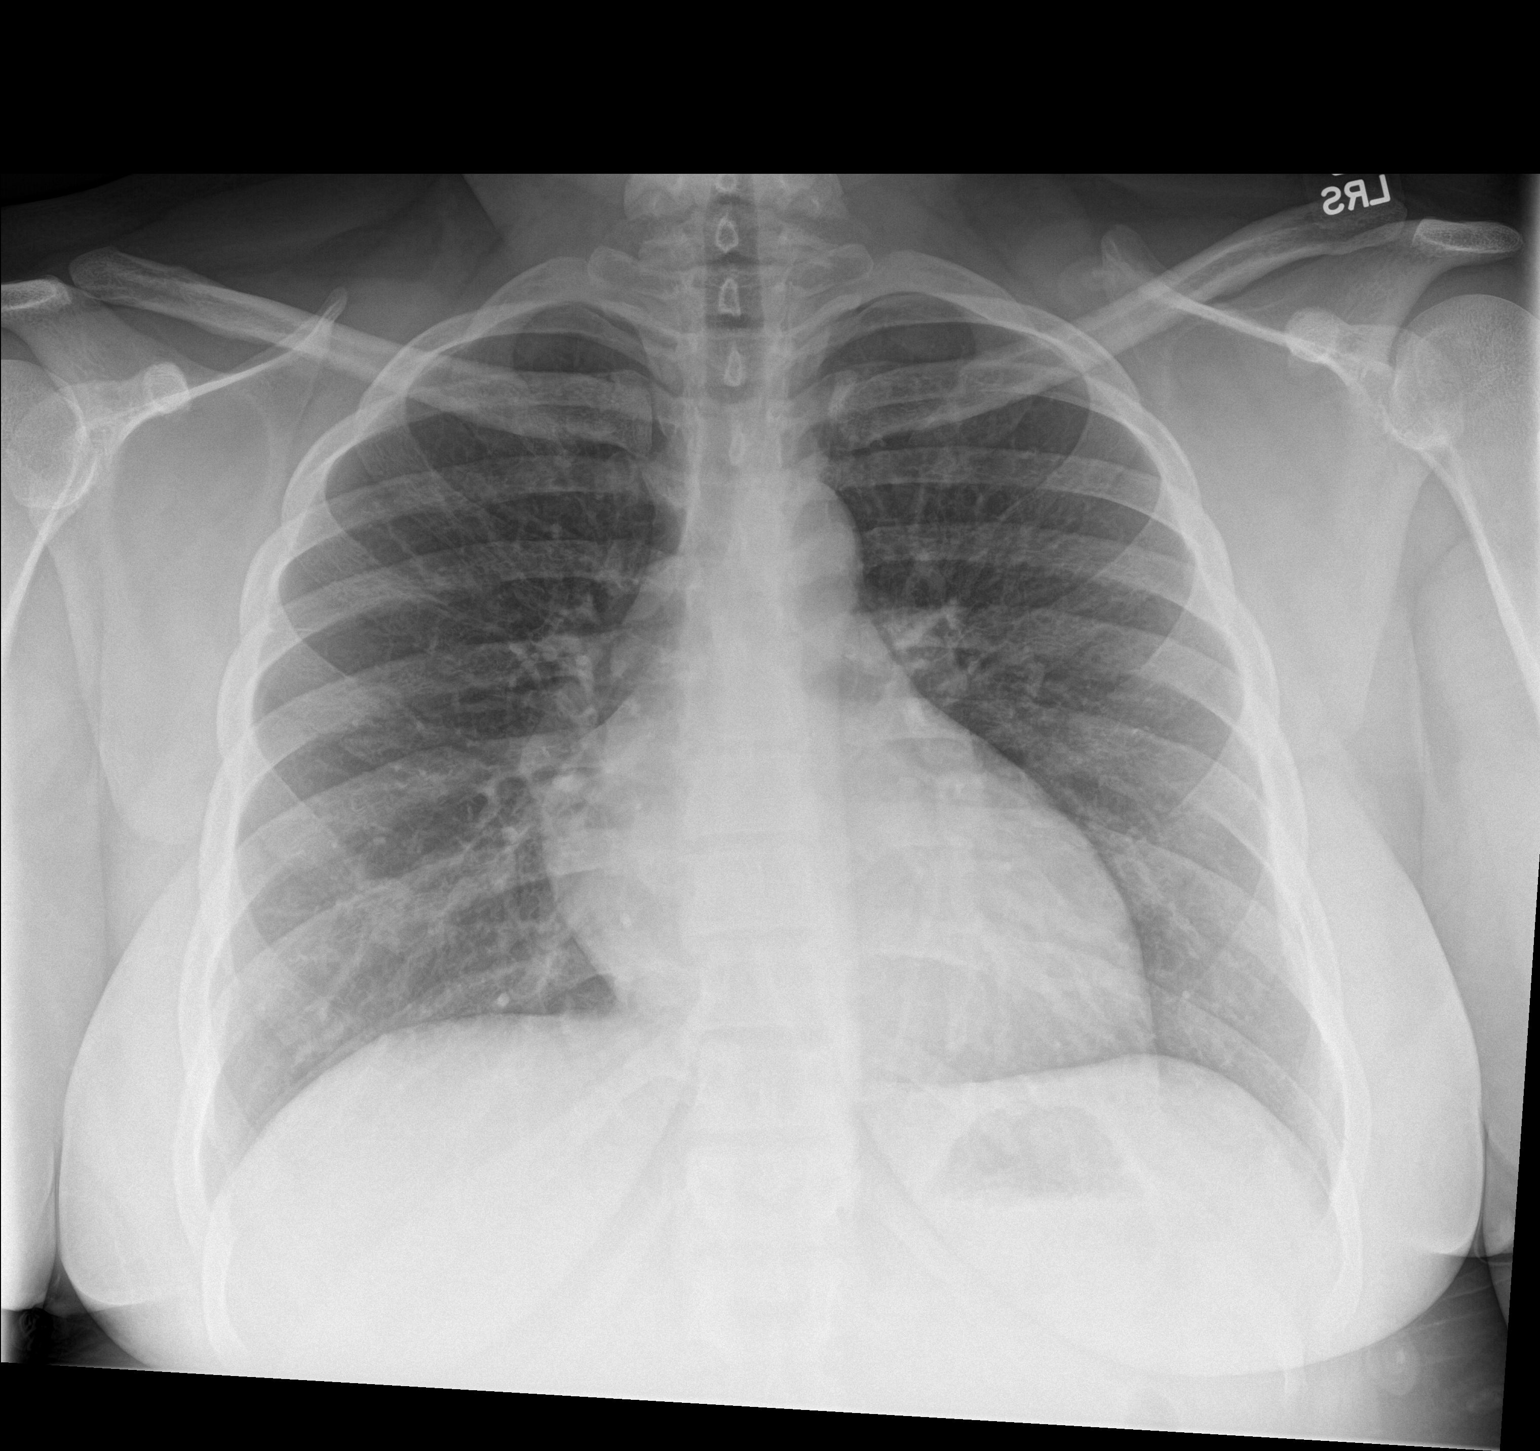

[chest lat]
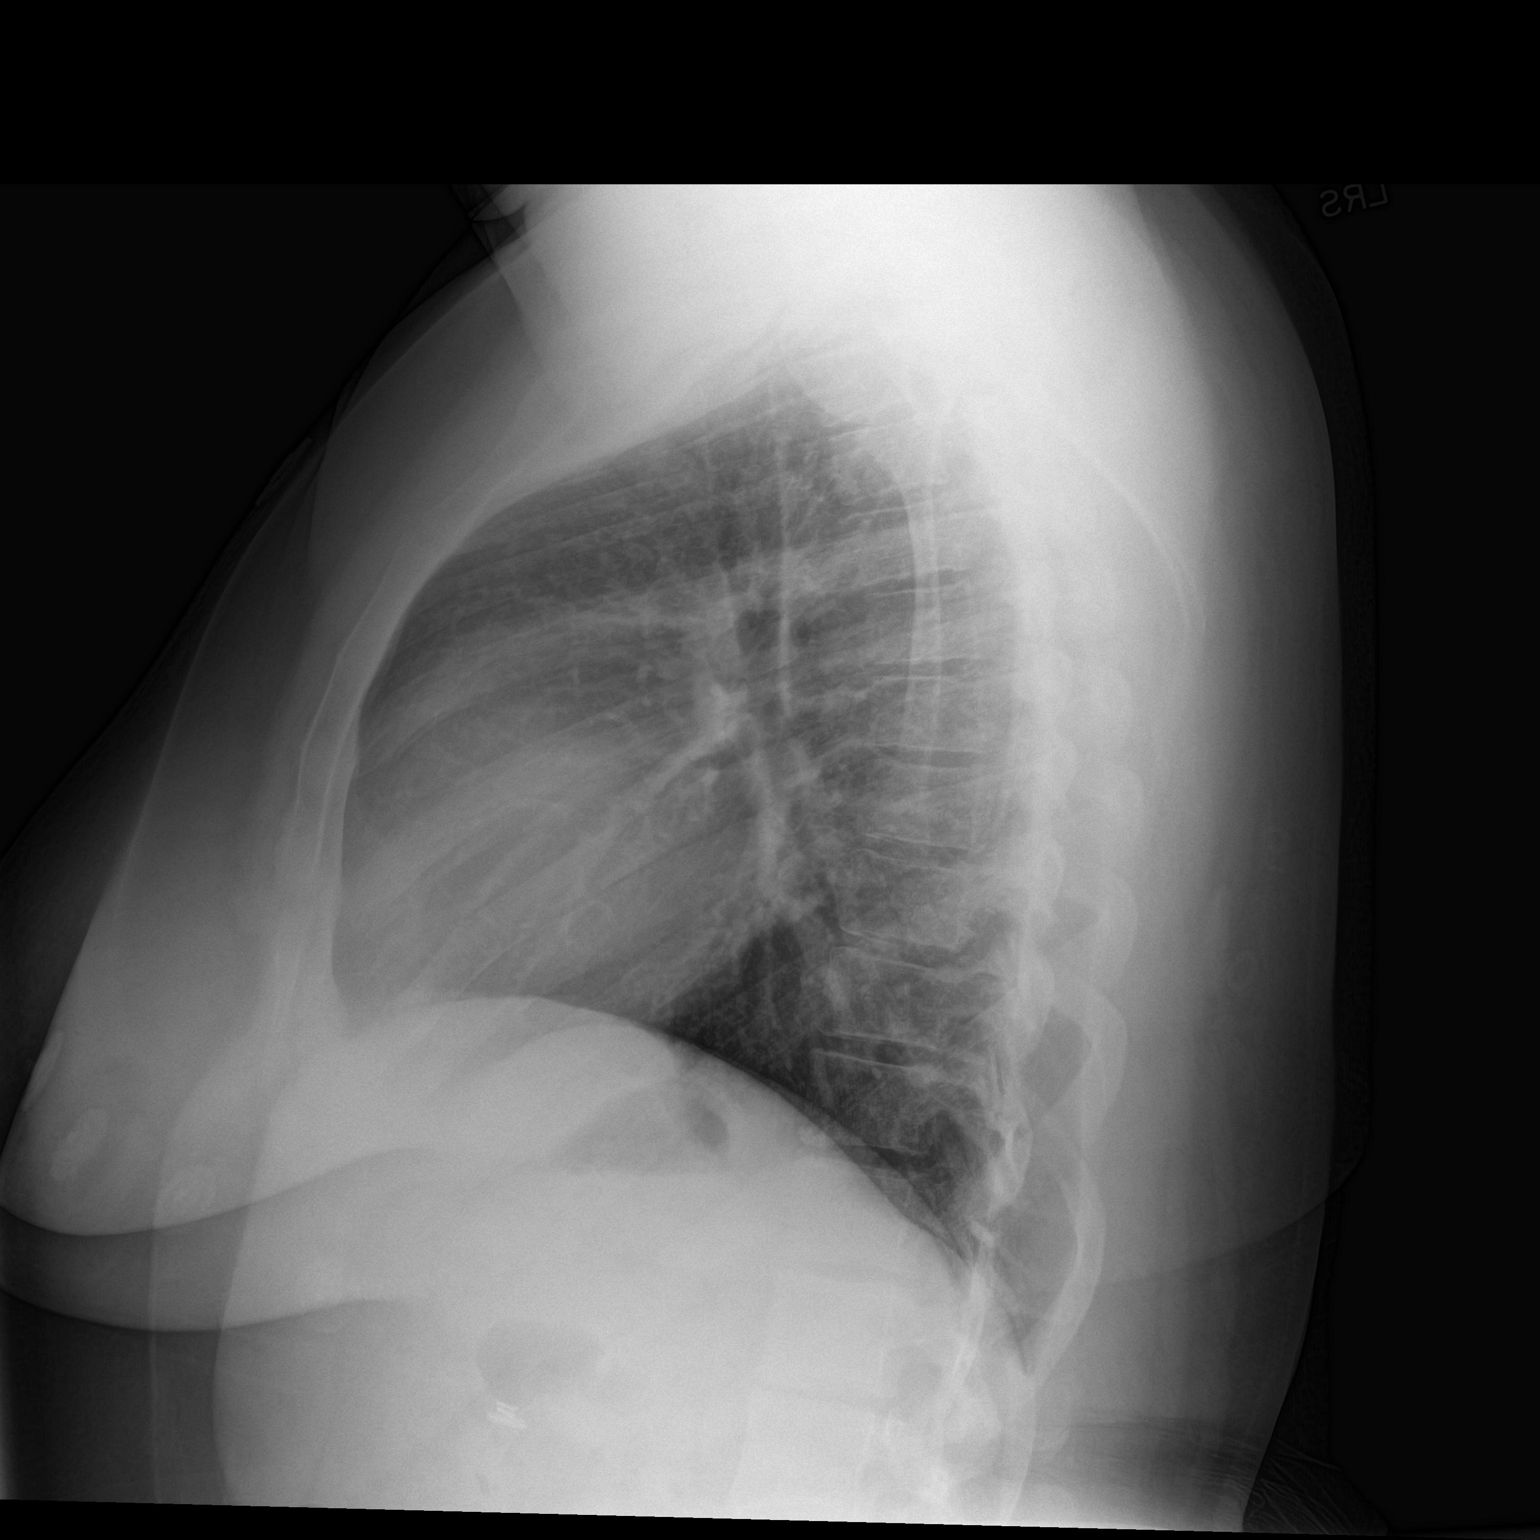

[2 of 2 positions shown; findings below may reference images not displayed]

FINDINGS: Mild enlargement of the cardiac silhouette again noted. Mediastinal
silhouette within normal limits.

Lungs are normally inflated. Lungs are clear without focal
infiltrate, pulmonary edema, or pleural effusion. No pneumothorax.

No acute osseus abnormality.  Cholecystectomy clips noted.
IMPRESSION: 1. No active cardiopulmonary disease.
2. Stable mild cardiomegaly.

## 2015-09-02 MED ORDER — NAPROXEN 500 MG PO TABS: 500.0000 mg | ORAL_TABLET | Freq: Once | ORAL | Status: DC

## 2015-09-02 MED ORDER — NAPROXEN 250 MG PO TABS
500.0000 mg | ORAL_TABLET | Freq: Once | ORAL | Status: AC
  Administered 2015-09-02: 500 mg via ORAL
  Filled 2015-09-02: qty 2

## 2015-09-02 NOTE — ED Notes (Signed)
Pt. reports chronic central chest pain for 7 months with mild SOB , denies cough and nausea.

## 2015-09-02 NOTE — ED Provider Notes (Signed)
CSN: [id number]     Arrival date & time 09/01/15  2358 History   By signing my name below, I, Arlan OrganAshley Leger, attest that this documentation has been prepared under the direction and in the presence of Derwood KaplanAnkit Lizzie Cokley, MD.  Electronically Signed: Arlan OrganAshley Leger, ED Scribe. 09/02/2015. 3:32 AM.   Chief Complaint  Patient presents with  . Chest Pain   The history is provided by the patient. No language interpreter was used.    HPI Comments: Karen Simon is a 25 y.o. female with a PMHx of asthma who presents to the Emergency Department complaining of intermittent, ongoing R sided chest pain that radiates to the mid chest x 7 months; worsened and constant since 9:00 PM this evening. Typically pain only lasts for approximately 30 minutes, however, pain became prolonged for several hours tonight. No aggravating or alleviating factors reported. No OTC medications or home remedies attempted prior to arrival. She denies any fever, chills, nausea, vomiting, shortness of breath, diaphoresis, or cough. No lower extremity swelling. She denies any recent surgeries. No recent long distance travel. No family history of heart disease. Pt was evaluated in the Emergency Department on 4/21 for same. At that time, CXR performed which revealed a mid enlargement of the cardiopericarditis silhouette. Out patient follow up with PCP or cardiologist recommended at time of discharge.  PCP: Kristian CoveyBURCHETTE,BRUCE W, MD    Past Medical History  Diagnosis Date  . Anemia   . Asthma   . H/O varicella   . H/O candidiasis   . Irregular periods/menstrual cycles 07/01/09  . Hypertension    Past Surgical History  Procedure Laterality Date  . No past surgeries    . Cholecystectomy  03/20/2012    Procedure: LAPAROSCOPIC CHOLECYSTECTOMY;  Surgeon: Shelly Rubensteinouglas A Blackman, MD;  Location: [facility];  Service: General;  Laterality: N/A;  . Cholecystectomy, laparoscopic N/A 2013   Family History  Problem Relation Age of Onset  .  Hypertension Mother   . Cancer Maternal Grandmother     breast  . Cancer Maternal Grandfather     colon  . Anesthesia problems Neg Hx    Social History  Substance Use Topics  . Smoking status: Never Smoker   . Smokeless tobacco: Never Used  . Alcohol Use: No   OB History    Gravida Para Term Preterm AB TAB SAB Ectopic Multiple Living   2 2 2  0 0 0 0 0 0 2     Review of Systems  Constitutional: Negative for fever and chills.  Respiratory: Negative for cough and shortness of breath.   Cardiovascular: Positive for chest pain.  Gastrointestinal: Negative for nausea, vomiting, abdominal pain and diarrhea.  Neurological: Negative for headaches.  Psychiatric/Behavioral: Negative for confusion.  All other systems reviewed and are negative.     Allergies  Penicillins; Iodine; Latex; Shellfish allergy; and Tomato  Home Medications   Prior to Admission medications   Medication Sig Start Date End Date Taking? Authorizing Provider  naproxen sodium (ANAPROX) 220 MG tablet Take 220 mg by mouth 2 (two) times daily with a meal.   Yes Historical Provider, MD  naproxen (NAPROSYN) 500 MG tablet Take 1 tablet (500 mg total) by mouth once. 09/02/15   Derwood KaplanAnkit Fredrick Dray, MD   Triage Vitals: BP 115/60 mmHg  Pulse 75  Temp(Src) 98.3 F (36.8 C) (Oral)  Resp 18  Ht 5\' 5"  (1.651 m)  Wt 275 lb (124.739 kg)  BMI 45.76 kg/m2  SpO2 100%  LMP 08/07/2015  Physical Exam  Constitutional: She is oriented to person, place, and time. She appears well-developed and well-nourished. No distress.  HENT:  Head: Normocephalic and atraumatic.  Eyes: EOM are normal.  Neck: Normal range of motion.  Cardiovascular: Normal rate, regular rhythm and normal heart sounds.  Exam reveals no friction rub.   Pulses:      Dorsalis pedis pulses are 2+ on the right side, and 2+ on the left side.  Pulmonary/Chest: Effort normal and breath sounds normal. She has no wheezes. She has no rales. She exhibits no tenderness.   Clear to ausculation  No reproducible chest tenderness   Abdominal: Soft. She exhibits no distension. There is no tenderness.  Musculoskeletal: Normal range of motion. She exhibits no edema.  No pitting edema or unilateral calf swelling   Neurological: She is alert and oriented to person, place, and time.  Skin: Skin is warm and dry.  Psychiatric: She has a normal mood and affect. Judgment normal.  Nursing note and vitals reviewed.   ED Course  Procedures (including critical care time)  DIAGNOSTIC STUDIES: Oxygen Saturation is 98% on RA, Normal by my interpretation.    COORDINATION OF CARE: 3:24 AM- Will order imaging, blood work, and EKG. Discussed treatment plan with pt at bedside and pt agreed to plan.     Labs Review Labs Reviewed  CBC - Abnormal; Notable for the following:    Hemoglobin 11.1 (*)    HCT 35.6 (*)    MCH 24.9 (*)    All other components within normal limits  BASIC METABOLIC PANEL  I-STAT TROPOININ, ED    Imaging Review Dg Chest 2 View  09/02/2015  CLINICAL DATA:  Initial evaluation for 7 months of chest pain, worsened tonight. EXAM: CHEST  2 VIEW COMPARISON:  Prior radiograph from 08/28/2015. FINDINGS: Mild enlargement of the cardiac silhouette again noted. Mediastinal silhouette within normal limits. Lungs are normally inflated. Lungs are clear without focal infiltrate, pulmonary edema, or pleural effusion. No pneumothorax. No acute osseus abnormality.  Cholecystectomy clips noted. IMPRESSION: 1. No active cardiopulmonary disease. 2. Stable mild cardiomegaly. Electronically Signed   By: Rise Mu M.D.   On: 09/02/2015 01:37   I have personally reviewed and evaluated these images and lab results as part of my medical decision-making.   EKG Interpretation   Date/Time:  Wednesday September 02 2015 00:09:36 EDT Ventricular Rate:  74 PR Interval:  174 QRS Duration: 88 QT Interval:  386 QTC Calculation: 428 R Axis:   85 Text Interpretation:   Normal sinus rhythm Normal ECG No significant change  since last tracing Confirmed by WARD,  DO, KRISTEN ([id number]) on 09/02/2015  2:20:28 AM      MDM   Final diagnoses:  Atypical chest pain   I personally performed the services described in this documentation, which was scribed in my presence. The recorded information has been reviewed and is accurate.  Pt comes in with cc of chest pain.  Differential diagnosis includes: ACS syndrome CHF exacerbation Valvular disorder Myocarditis Pericarditis Pericardial effusion Pneumonia Pleural effusion Pulmonary edema PE Anemia Musculoskeletal pain  Pain is R sided, but not pleuritic, musculoskeletal, or exertional. Pt is PERC neg. She has no ACS risk factors.  Results discussed. PT is to see Cards soon for echo - which is reassuring, but again, we dont think the pain is ACS related.  Strict ER return precautions have been discussed, and patient is agreeing with the plan and is comfortable with the workup done and the recommendations  from the ER.   Derwood Kaplan, MD 09/02/15 [phone]

## 2015-09-02 NOTE — Discharge Instructions (Signed)
We saw you in the ER for the chest pain/shortness of breath. All of our cardiac workup is normal, including labs, EKG and chest X-RAY are normal. We are not sure what is causing your discomfort, but we feel comfortable sending you home at this time. The workup in the ER is not complete, and you should follow up with your primary care doctor for further evaluation.   Chest Wall Pain Chest wall pain is pain in or around the bones and muscles of your chest. Sometimes, an injury causes this pain. Sometimes, the cause may not be known. This pain may take several weeks or longer to get better. HOME CARE INSTRUCTIONS  Pay attention to any changes in your symptoms. Take these actions to help with your pain:   Rest as told by your health care provider.   Avoid activities that cause pain. These include any activities that use your chest muscles or your abdominal and side muscles to lift heavy items.   If directed, apply ice to the painful area:  Put ice in a plastic bag.  Place a towel between your skin and the bag.  Leave the ice on for 20 minutes, 2-3 times per day.  Take over-the-counter and prescription medicines only as told by your health care provider.  Do not use tobacco products, including cigarettes, chewing tobacco, and e-cigarettes. If you need help quitting, ask your health care provider.  Keep all follow-up visits as told by your health care provider. This is important. SEEK MEDICAL CARE IF:  You have a fever.  Your chest pain becomes worse.  You have new symptoms. SEEK IMMEDIATE MEDICAL CARE IF:  You have nausea or vomiting.  You feel sweaty or light-headed.  You have a cough with phlegm (sputum) or you cough up blood.  You develop shortness of breath.   This information is not intended to replace advice given to you by your health care provider. Make sure you discuss any questions you have with your health care provider.   Document Released: 04/25/2005 Document  Revised: 01/14/2015 Document Reviewed: 07/21/2014 Elsevier Interactive Patient Education [employer]2016 Elsevier Inc.

## 2015-09-16 NOTE — Progress Notes (Signed)
Cardiology Office Note   Date:  09/17/2015   ID:  Karen Simon, DOB 1990/11/18, MRN [id number]  PCP:  Kristian Covey, MD  Cardiologist:   Chilton Si, MD   Chief Complaint  Patient presents with  . New Evaluation    post ED for chest pain  pt c/o chest pressure/tightness--happens every other day, sparratic; occasional SOB; no other Sx.  switching primary care to [facility] [place]--has not had appt there yet      History of Present Illness: Karen Simon is a 25 y.o. female with asthma who presents for an evaluation of chest pain.  Karen Simon was seen in the ED on 4/21 and 4/26 with 7 months of R sided chest pain.  CXR showed mild cardiomegaly.  Her chest pain was reproducible with palpation.  She was instructed to take ibuprofen as needed and follow up with cardiology.  Karen Simon reports 6 months of chest pain.  She describes it as chest tightness.  It is worse with movement but not with exertion.  At times it is associated with shortness of breath. Symptoms are mild to moderate in intensity. Previously it occurred daily but now is every other day. The most severe episode lasted up to 3 hours. Typically it lasts for around one hour. There is no associated nausea or diaphoresis. She denies palpitations, lightheadedness, or dizziness.  She also denies lower extremity edema, orthopnea or PND.    Karen Simon does not get any formal exercise. Lately she has been trying to walk or play with indicates at least 3 times per week for one to 2 hours. She does not get chest pain with this activity. She does sometimes feel short of breath but she feels this is consistent with being overweight and out of shape.  She has made some dietary changes and no longer eats fried food or pork.  She has increased her fruit and vegetable intake.  She drinks mostly juice throughout the day.   Past Medical History  Diagnosis Date  . Anemia   . Asthma   . H/O varicella    . H/O candidiasis   . Irregular periods/menstrual cycles 07/01/09  . Hypertension   . Morbid obesity (HCC) 09/17/2015    Past Surgical History  Procedure Laterality Date  . No past surgeries    . Cholecystectomy  03/20/2012    Procedure: LAPAROSCOPIC CHOLECYSTECTOMY;  Surgeon: Shelly Rubenstein, MD;  Location: [facility];  Service: General;  Laterality: N/A;  . Cholecystectomy, laparoscopic N/A 2013     Current Outpatient Prescriptions  Medication Sig Dispense Refill  . naproxen (NAPROSYN) 500 MG tablet Take 1 tablet (500 mg total) by mouth once. 30 tablet 0   No current facility-administered medications for this visit.    Allergies:   Penicillins; Iodine; Latex; Shellfish allergy; and Tomato    Social History:  The patient  reports that she has never smoked. She has never used smokeless tobacco. She reports that she does not drink alcohol or use illicit drugs.   Family History:  The patient's family history includes Cancer in her maternal grandfather and maternal grandmother; Hypertension in her mother. There is no history of Anesthesia problems.    ROS:  Please see the history of present illness.   Otherwise, review of systems are positive for none.   All other systems are reviewed and negative.    PHYSICAL EXAM: VS:  BP 120/79 mmHg  Pulse 90  Ht 5' 5.5" (1.664  m)  Wt 131.815 kg (290 lb 9.6 oz)  BMI 47.61 kg/m2  LMP 08/07/2015 , BMI Body mass index is 47.61 kg/(m^2). GENERAL:  Well appearing. Obese  HEENT:  Pupils equal round and reactive, fundi not visualized, oral mucosa unremarkable NECK:  No jugular venous distention, waveform within normal limits, carotid upstroke brisk and symmetric, no bruits, no thyromegaly LYMPHATICS:  No cervical adenopathy LUNGS:  Clear to auscultation bilaterally HEART:  RRR.  PMI not displaced or sustained,S1 and S2 within normal limits, no S3, no S4, no clicks, no rubs, no murmurs ABD:  Flat, positive bowel sounds normal in frequency in  pitch, no bruits, no rebound, no guarding, no midline pulsatile mass, no hepatomegaly, no splenomegaly EXT:  2 plus pulses throughout, no edema, no cyanosis no clubbing SKIN:  No rashes no nodules NEURO:  Cranial nerves II through XII grossly intact, motor grossly intact throughout PSYCH:  Cognitively intact, oriented to person place and time    EKG:  EKG is not ordered today. The ekg ordered 09/03/15  demonstrates sinus rhythm. Rate 78 bpm.   Recent Labs: 09/02/2015: BUN 15; Creatinine, Ser 0.68; Hemoglobin 11.1*; Platelets 300; Potassium 3.9; Sodium 138    Lipid Panel No results found for: CHOL, TRIG, HDL, CHOLHDL, VLDL, LDLCALC, LDLDIRECT    Wt Readings from Last 3 Encounters:  09/17/15 131.815 kg (290 lb 9.6 oz)  09/02/15 124.739 kg (275 lb)  08/28/15 135.172 kg (298 lb)      ASSESSMENT AND PLAN:  # Musculoskeletal chest pain: Karen Simon has chest pain on palpitation.  She does not have exertional symptoms.  This is consistent with musculoskeletal chest pain.  Agree with using naproxen as needed.   # Cardiomegaly: Chest x-ray revealed mild cardiomegaly. She does not have any evidence of heart failure on exam. We will obtain an echocardiogram to evaluate for structural heart disease.  # Morbid obesity: We discussed the importance of improving her diet and regular exercise.  She expressed understanding.  We will check screening lipids.    Current medicines are reviewed at length with the patient today.  The patient does not have concerns regarding medicines.  The following changes have been made:  no change  Labs/ tests ordered today include:   Orders Placed This Encounter  Procedures  . Lipid Profile  . ECHOCARDIOGRAM COMPLETE     Disposition:   FU with Devaeh Amadi C. Duke Salviaandolph, MD, [facility] as needed.    This note was written with the assistance of speech recognition software.  Please excuse any transcriptional errors.  Signed, Doniqua Saxby C. Duke Salviaandolph, MD, [facility]    09/17/2015 4:13 PM    [facility]

## 2015-09-17 ENCOUNTER — Ambulatory Visit (INDEPENDENT_AMBULATORY_CARE_PROVIDER_SITE_OTHER): Payer: Self-pay | Admitting: Cardiovascular Disease

## 2015-09-17 ENCOUNTER — Encounter: Payer: Self-pay | Admitting: Cardiovascular Disease

## 2015-09-17 VITALS — BP 120/79 | HR 90 | Ht 65.5 in | Wt 290.6 lb

## 2015-09-17 DIAGNOSIS — I517 Cardiomegaly: Secondary | ICD-10-CM

## 2015-09-17 DIAGNOSIS — R079 Chest pain, unspecified: Secondary | ICD-10-CM

## 2015-09-17 DIAGNOSIS — E669 Obesity, unspecified: Secondary | ICD-10-CM

## 2015-09-17 HISTORY — DX: Morbid (severe) obesity due to excess calories: E66.01

## 2015-09-17 NOTE — Patient Instructions (Addendum)
Medication Instructions:  Your physician recommends that you continue on your current medications as directed. Please refer to the Current Medication list given to you today.   Labwork: [facility]  Testing/Procedures: Your physician has requested that you have an echocardiogram. Echocardiography is a painless test that uses sound waves to create images of your heart. It provides your doctor with information about the size and shape of your heart and how well your heart's chambers and valves are working. This procedure takes approximately one hour. There are no restrictions for this procedure. [facility] [address]  Follow-Up: As needed   If you need a refill on your cardiac medications before your next appointment, please call your pharmacy.

## 2015-09-22 LAB — LIPID PANEL
CHOLESTEROL: 179 mg/dL (ref 125–200)
HDL: 43 mg/dL — ABNORMAL LOW (ref >=46)
LDL CALC: 123 mg/dL (ref <130)
TRIGLYCERIDES: 64 mg/dL (ref <150)
Total CHOL/HDL Ratio: 4.2 Ratio (ref <=5.0)
VLDL: 13 mg/dL (ref <30)

## 2015-09-23 ENCOUNTER — Other Ambulatory Visit: Payer: Self-pay

## 2015-09-23 ENCOUNTER — Ambulatory Visit (HOSPITAL_COMMUNITY): Payer: Self-pay | Attending: Cardiovascular Disease

## 2015-09-23 DIAGNOSIS — I517 Cardiomegaly: Secondary | ICD-10-CM | POA: Insufficient documentation

## 2015-09-23 DIAGNOSIS — R079 Chest pain, unspecified: Secondary | ICD-10-CM | POA: Insufficient documentation

## 2015-10-01 ENCOUNTER — Other Ambulatory Visit (HOSPITAL_COMMUNITY): Payer: Medicaid Other

## 2015-11-02 ENCOUNTER — Emergency Department (HOSPITAL_COMMUNITY)
Admission: EM | Admit: 2015-11-02 | Discharge: 2015-11-02 | Disposition: A | Discharge status: Home / Self Care | Payer: Self-pay | Attending: Emergency Medicine | Admitting: Emergency Medicine

## 2015-11-02 ENCOUNTER — Other Ambulatory Visit: Payer: Self-pay

## 2015-11-02 ENCOUNTER — Encounter (HOSPITAL_COMMUNITY): Payer: Self-pay | Admitting: Emergency Medicine

## 2015-11-02 ENCOUNTER — Emergency Department (HOSPITAL_COMMUNITY): Payer: Self-pay

## 2015-11-02 DIAGNOSIS — R0602 Shortness of breath: Secondary | ICD-10-CM | POA: Insufficient documentation

## 2015-11-02 DIAGNOSIS — Z5321 Procedure and treatment not carried out due to patient leaving prior to being seen by health care provider: Secondary | ICD-10-CM | POA: Insufficient documentation

## 2015-11-02 DIAGNOSIS — R079 Chest pain, unspecified: Secondary | ICD-10-CM | POA: Insufficient documentation

## 2015-11-02 DIAGNOSIS — I10 Essential (primary) hypertension: Secondary | ICD-10-CM | POA: Insufficient documentation

## 2015-11-02 DIAGNOSIS — J45909 Unspecified asthma, uncomplicated: Secondary | ICD-10-CM | POA: Insufficient documentation

## 2015-11-02 DIAGNOSIS — R42 Dizziness and giddiness: Secondary | ICD-10-CM | POA: Insufficient documentation

## 2015-11-02 LAB — BASIC METABOLIC PANEL
Anion gap: 7 (ref 5–15)
BUN: 11 mg/dL (ref 6–20)
CHLORIDE: 102 mmol/L (ref 101–111)
CO2: 27 mmol/L (ref 22–32)
CREATININE: 0.86 mg/dL (ref 0.44–1.00)
Calcium: 9.4 mg/dL (ref 8.9–10.3)
GFR calc Af Amer: >60 mL/min (ref >60)
GFR calc non Af Amer: >60 mL/min (ref >60)
Glucose, Bld: 86 mg/dL (ref 65–99)
POTASSIUM: 4.2 mmol/L (ref 3.5–5.1)
Sodium: 136 mmol/L (ref 135–145)

## 2015-11-02 LAB — CBC
HCT: 35.6 % — ABNORMAL LOW (ref 36.0–46.0)
Hemoglobin: 11.3 g/dL — ABNORMAL LOW (ref 12.0–15.0)
MCH: 25.6 pg — ABNORMAL LOW (ref 26.0–34.0)
MCHC: 31.7 g/dL (ref 30.0–36.0)
MCV: 80.7 fL (ref 78.0–100.0)
Platelets: 299 K/uL (ref 150–400)
RBC: 4.41 MIL/uL (ref 3.87–5.11)
RDW: 14.5 % (ref 11.5–15.5)
WBC: 9.3 K/uL (ref 4.0–10.5)

## 2015-11-02 LAB — I-STAT TROPONIN, ED: Troponin i, poc: 0.01 ng/mL (ref 0.00–0.08)

## 2015-11-02 IMAGING — CR DG CHEST 2V
2 series · 2 of 2 positions shown · non-contrast
Comparison: [DATE]

CLINICAL DATA: Chest pain for 1 year. Shortness of breath. Diarrhea
and lightheadedness.

EXAM:
CHEST  2 VIEW

[chest pa]
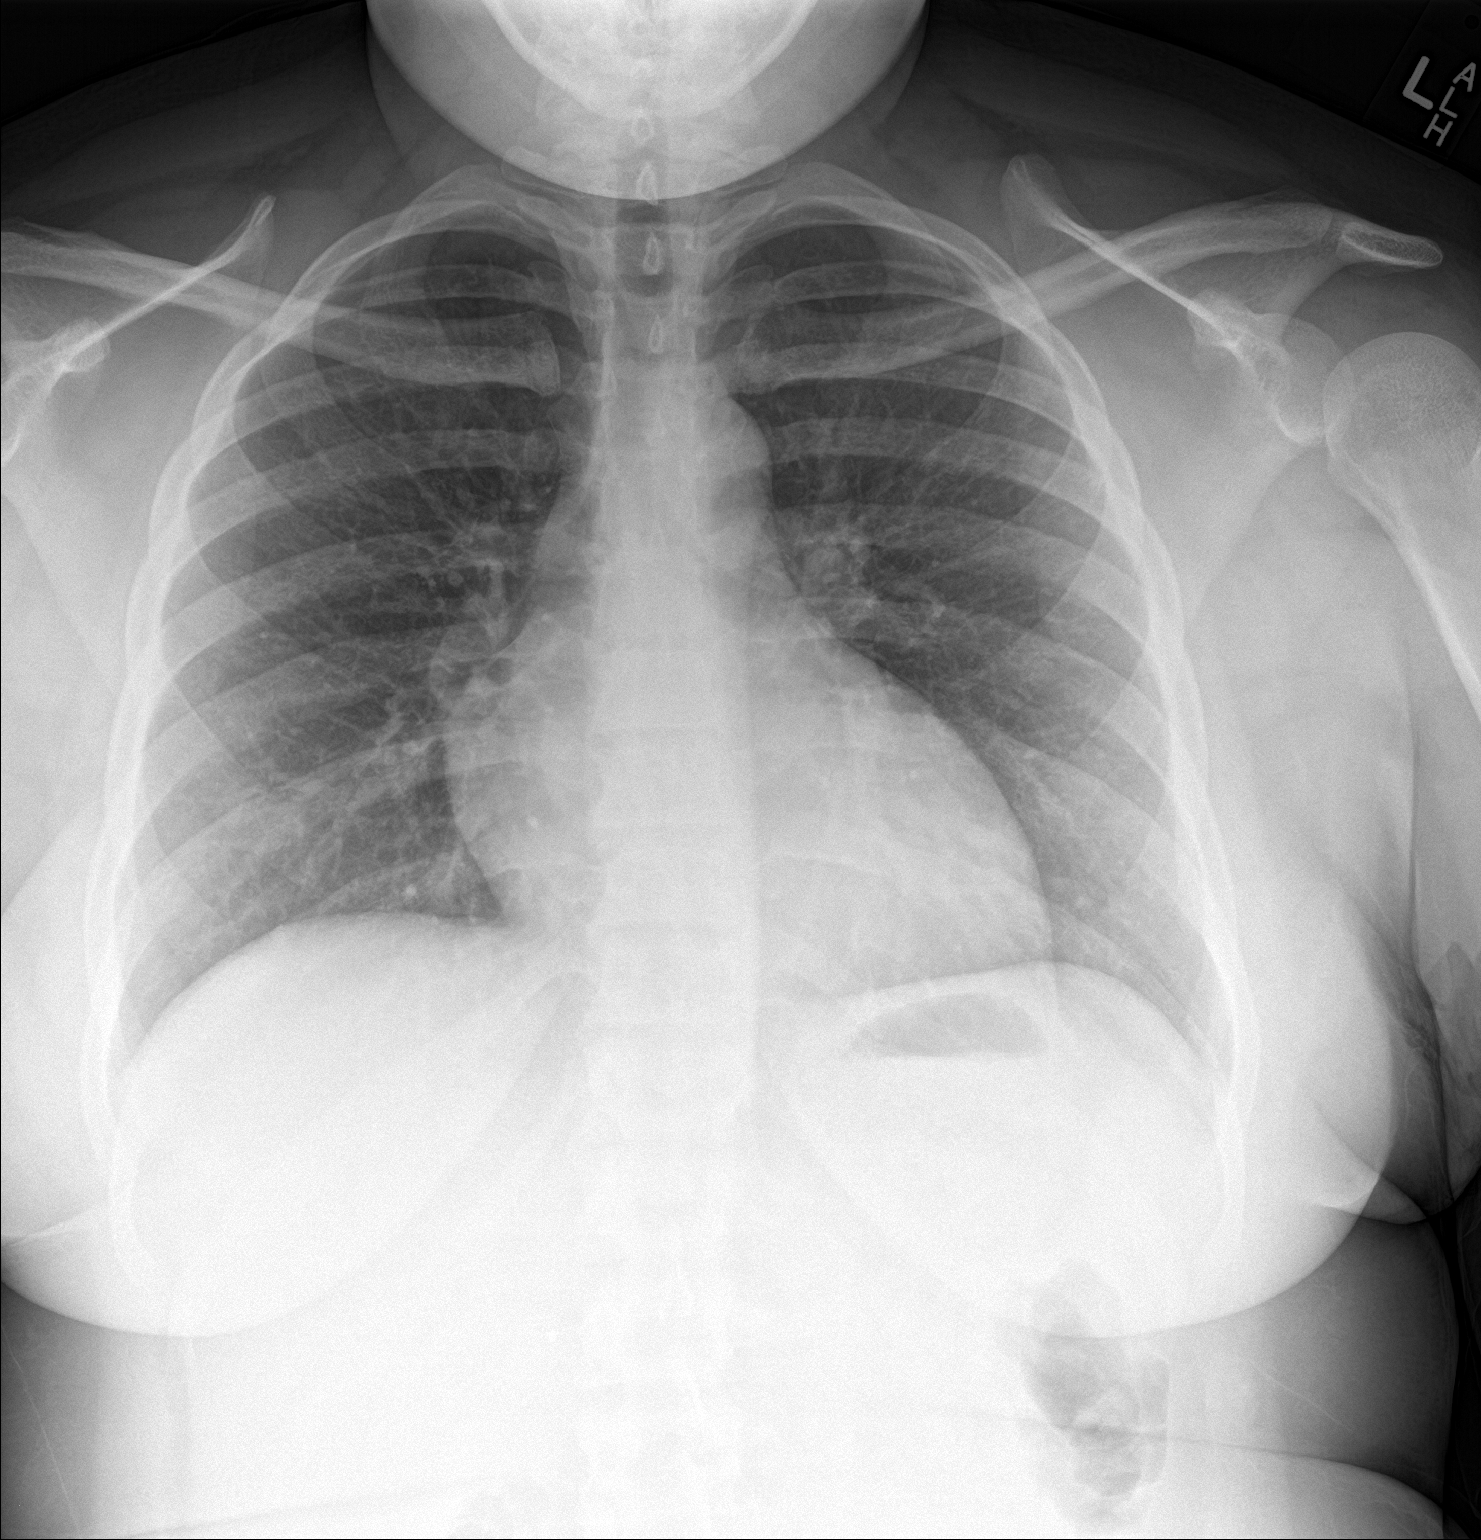

[chest lat]
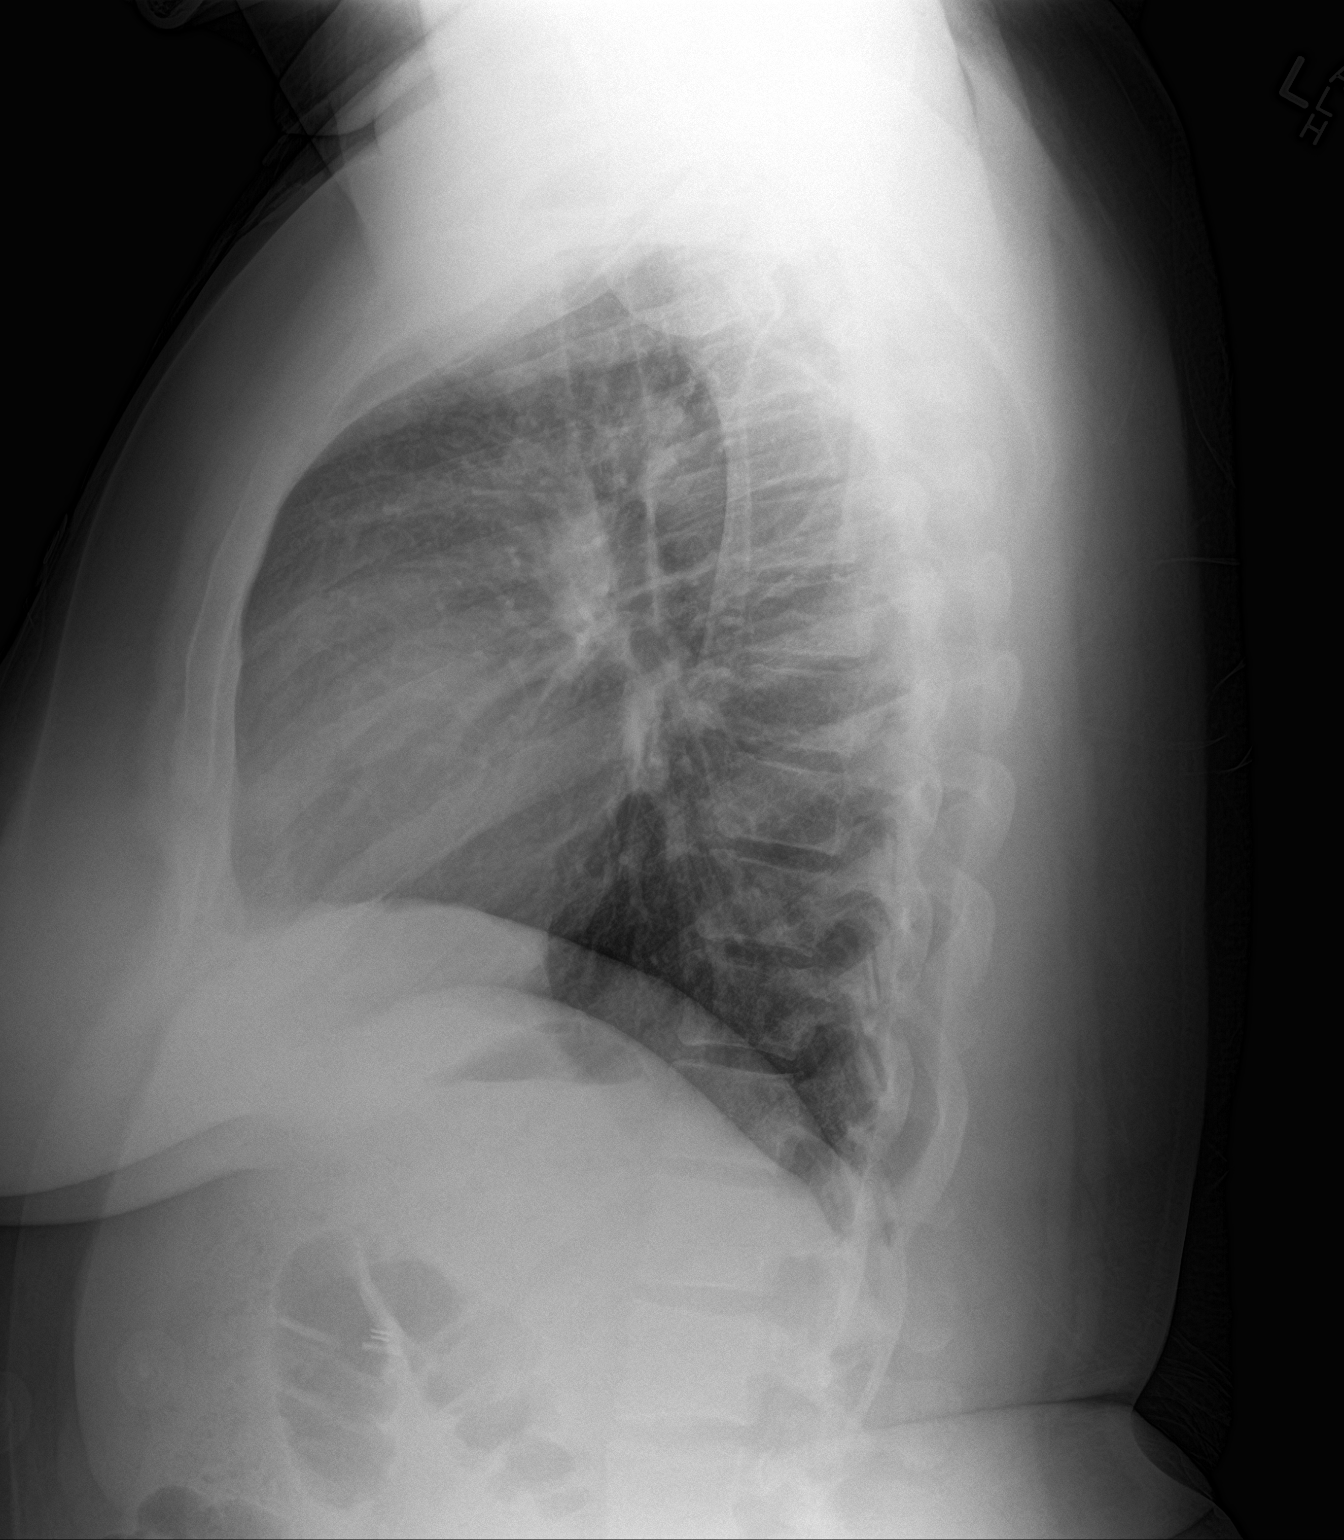

[2 of 2 positions shown; findings below may reference images not displayed]

FINDINGS: Mild enlargement of the cardiopericardial silhouette, without edema.
The lungs appear clear. No pleural effusion. exam otherwise
unremarkable.
IMPRESSION: 1. Mild enlargement of the cardiopericardial silhouette, stable. No
acute findings.

## 2015-11-02 NOTE — ED Notes (Signed)
Patient with chest pain.  She states she has been diagnosed with costochondritis but the pain meds have not been helping.  She states she was here 2 months ago for the same.  She does have some shortness of breath and she is feeling like she is lightheaded.

## 2015-11-04 ENCOUNTER — Telehealth: Payer: Self-pay | Admitting: Cardiovascular Disease

## 2015-11-04 NOTE — Telephone Encounter (Signed)
Returned call to patient. Reminded her of Dr. Leonides Sakeandolph's recommendations for diet improvement and increasing exercise. Patient voiced understanding and thanks.

## 2015-11-04 NOTE — Telephone Encounter (Signed)
Pt said it was determined by her lab work that her cholesterol was high.She wants to know if Dr Duke Salviaandolph is going to put her on cholesterol medicine?

## 2015-12-02 ENCOUNTER — Ambulatory Visit (HOSPITAL_COMMUNITY)
Admission: EM | Admit: 2015-12-02 | Discharge: 2015-12-02 | Disposition: A | Discharge status: Home / Self Care | Payer: Self-pay | Attending: Family Medicine | Admitting: Family Medicine

## 2015-12-02 ENCOUNTER — Encounter (HOSPITAL_COMMUNITY): Payer: Self-pay | Admitting: *Deleted

## 2015-12-02 DIAGNOSIS — G44219 Episodic tension-type headache, not intractable: Secondary | ICD-10-CM

## 2015-12-02 MED ORDER — TRAZODONE HCL 100 MG PO TABS: 100.0000 mg | ORAL_TABLET | Freq: Every day | ORAL | 1 refills | Status: DC

## 2015-12-02 NOTE — ED Triage Notes (Signed)
Assessment per Dr. Kindl. 

## 2015-12-02 NOTE — ED Provider Notes (Signed)
[facility]    CSN: [id number] Arrival date & time: 12/02/15  1827  First Provider Contact:  None       History   Chief Complaint No chief complaint on file.   HPI Karen Simon is a 25 y.o. female.    Headache  Pain location:  Occipital Quality:  Dull Radiates to:  Does not radiate Onset quality:  Gradual Duration:  1 week Progression:  Unchanged Chronicity:  New Similar to prior headaches: no (h/o migrainesbut none in long time and this is unlike prev.)   Context comment:  Not sleeping. Relieved by:  Nothing Worsened by:  Nothing Ineffective treatments:  None tried Associated symptoms: neck pain   Associated symptoms: no abdominal pain, no blurred vision, no dizziness, no eye pain, no loss of balance, no near-syncope, no numbness, no paresthesias, no photophobia, no seizures and no weakness   Risk factors: insomnia     Past Medical History:  Diagnosis Date  . Anemia   . Asthma   . H/O candidiasis   . H/O varicella   . Hypertension   . Irregular periods/menstrual cycles 07/01/09  . Morbid obesity (HCC) 09/17/2015    Patient Active Problem List   Diagnosis Date Noted  . Morbid obesity (HCC) 09/17/2015  . Normal labor 02/23/2014  . Lactating mother 11/27/2011  . Teen pregnancy 11/27/2011  . Pregnant state, incidental 11/25/2011  . Vaginal delivery 11/25/2011  . Perineal laceration complicating delivery 11/25/2011  . Candida albicans infection 11/15/2011  . Excess weight gain in pregnancy 11/07/2011  . Anemia in pregnancy 09/14/2011  . History of high blood pressure - never took meds 04/12/2011  . 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 History of anemia 11/26/2010  . Asthma 11/26/2010  . Migraine 11/26/2010    Past Surgical History:  Procedure Laterality Date  . CHOLECYSTECTOMY  03/20/2012   Procedure: LAPAROSCOPIC CHOLECYSTECTOMY;  Surgeon: Shelly Rubenstein, MD;  Location: [facility];  Service: General;   Laterality: N/A;  . CHOLECYSTECTOMY, LAPAROSCOPIC N/A 2013  . NO PAST SURGERIES      OB History    Gravida Para Term Preterm AB Living   2 2 2  0 0 2   SAB TAB Ectopic Multiple Live Births   0 0 0 0         Home Medications    Prior to Admission medications   Medication Sig Start Date End Date Taking? Authorizing Provider  naproxen (NAPROSYN) 500 MG tablet Take 1 tablet (500 mg total) by mouth once. 09/02/15   Derwood Kaplan, MD  traZODone (DESYREL) 100 MG tablet Take 1 tablet (100 mg total) by mouth at bedtime. For sleep 12/02/15   Linna Hoff, MD    Family History Family History  Problem Relation Age of Onset  . Hypertension Mother   . Cancer Maternal Grandmother     breast  . Cancer Maternal Grandfather     colon  . Anesthesia problems Neg Hx     Social History Social History  Substance Use Topics  . Smoking status: Never Smoker  . Smokeless tobacco: Never Used  . Alcohol use No     Allergies   Penicillins; Iodine; Latex; Shellfish allergy; and Tomato   Review of Systems Review of Systems  Constitutional: Negative.   Eyes: Negative for blurred vision, photophobia and pain.  Cardiovascular: Negative for near-syncope.  Gastrointestinal: Negative for abdominal pain.  Musculoskeletal: Positive for neck pain.  Neurological: Positive for headaches. Negative for  dizziness, seizures, syncope, weakness, numbness, paresthesias and loss of balance.  Psychiatric/Behavioral: Positive for sleep disturbance.  All other systems reviewed and are negative.    Physical Exam Triage Vital Signs ED Triage Vitals [12/02/15 1940]  Enc Vitals Group     BP 116/73     Pulse Rate 82     Resp 18     Temp 98.4 F (36.9 C)     Temp Source Oral     SpO2 98 %     Weight 275 lb (124.7 kg)     Height  (1.651 m)     Head Circumference      Peak Flow      Pain Score      Pain Loc      Pain Edu?      Excl. in GC?    No data found.   Updated Vital Signs BP 116/73  (BP Location: Right Arm)   Pulse 82   Temp 98.4 F (36.9 C) (Oral)   Resp 18   Ht  (1.651 m)   Wt 275 lb (124.7 kg)   LMP 10/27/2015 (Exact Date)   SpO2 98%   BMI 45.76 kg/m   Visual Acuity Right Eye Distance:   Left Eye Distance:   Bilateral Distance:    Right Eye Near:   Left Eye Near:    Bilateral Near:     Physical Exam  Constitutional: She is oriented to person, place, and time. She appears well-developed and well-nourished. No distress.  HENT:  Right Ear: External ear normal.  Left Ear: External ear normal.  Mouth/Throat: Oropharynx is clear and moist.  Eyes: EOM are normal. Pupils are equal, round, and reactive to light.  Neck: Trachea normal. Muscular tenderness present. No neck rigidity. Normal range of motion present. No Brudzinski's sign and no Kernig's sign noted.  Cardiovascular: Normal rate.   Pulmonary/Chest: Effort normal.  Lymphadenopathy:    She has no cervical adenopathy.  Neurological: She is alert and oriented to person, place, and time. No cranial nerve deficit.  Skin: Skin is warm and dry.  Nursing note and vitals reviewed.    UC Treatments / Results  Labs (all labs ordered are listed, but only abnormal results are displayed) Labs Reviewed - No data to display  EKG  EKG Interpretation None       Radiology No results found.  Procedures Procedures (including critical care time)  Medications Ordered in UC Medications - No data to display   Initial Impression / Assessment and Plan / UC Course  I have reviewed the triage vital signs and the nursing notes.  Pertinent labs & imaging results that were available during my care of the patient were reviewed by me and considered in my medical decision making (see chart for details).  Clinical Course      Final Clinical Impressions(s) / UC Diagnoses   Final diagnoses:  Episodic tension-type headache, not intractable    New Prescriptions New Prescriptions   TRAZODONE  (DESYREL) 100 MG TABLET    Take 1 tablet (100 mg total) by mouth at bedtime. For sleep     Linna Hoff, MD 12/02/15 [phone]

## 2015-12-16 ENCOUNTER — Ambulatory Visit (HOSPITAL_COMMUNITY)
Admission: EM | Admit: 2015-12-16 | Discharge: 2015-12-16 | Disposition: A | Discharge status: Home / Self Care | Payer: Medicaid Other | Attending: Family Medicine | Admitting: Family Medicine

## 2015-12-16 ENCOUNTER — Encounter (HOSPITAL_COMMUNITY): Payer: Self-pay | Admitting: *Deleted

## 2015-12-16 DIAGNOSIS — R51 Headache: Secondary | ICD-10-CM

## 2015-12-16 DIAGNOSIS — R519 Headache, unspecified: Secondary | ICD-10-CM

## 2015-12-16 MED ORDER — ONDANSETRON 4 MG PO TBDP
4.0000 mg | ORAL_TABLET | Freq: Once | ORAL | Status: AC
  Administered 2015-12-16: 4 mg via ORAL

## 2015-12-16 MED ORDER — KETOROLAC TROMETHAMINE 60 MG/2ML IM SOLN
INTRAMUSCULAR | Status: AC
  Filled 2015-12-16: qty 2

## 2015-12-16 MED ORDER — NAPROXEN 375 MG PO TABS
375.0000 mg | ORAL_TABLET | Freq: Two times a day (BID) | ORAL | 0 refills | Status: DC
Start: 2015-12-16 — End: 2016-08-02

## 2015-12-16 MED ORDER — DEXAMETHASONE SODIUM PHOSPHATE 10 MG/ML IJ SOLN
INTRAMUSCULAR | Status: AC
  Filled 2015-12-16: qty 1

## 2015-12-16 MED ORDER — ONDANSETRON 4 MG PO TBDP
ORAL_TABLET | ORAL | Status: AC
  Filled 2015-12-16: qty 1

## 2015-12-16 MED ORDER — DEXAMETHASONE SODIUM PHOSPHATE 10 MG/ML IJ SOLN
10.0000 mg | Freq: Once | INTRAMUSCULAR | Status: AC
  Administered 2015-12-16: 10 mg via INTRAMUSCULAR

## 2015-12-16 MED ORDER — KETOROLAC TROMETHAMINE 60 MG/2ML IM SOLN
60.0000 mg | Freq: Once | INTRAMUSCULAR | Status: AC
  Administered 2015-12-16: 60 mg via INTRAMUSCULAR

## 2015-12-16 NOTE — ED Triage Notes (Signed)
Pt  Reports  Symptoms  Of  headache   With   Nausea    And   Some  blurred blurred   Vision   At  Time   Pt has  a  History  Of  Migraines    In past

## 2015-12-16 NOTE — ED Provider Notes (Signed)
CSN: [id number]     Arrival date & time 12/16/15  1530 History   First MD Initiated Contact with Patient 12/16/15 1631     Chief Complaint  Patient presents with  . Headache   (Consider location/radiation/quality/duration/timing/severity/associated sxs/prior Treatment) 25 year old female is complaining of a headache since 2 PM today. This is a recurring headache over the past couple of weeks. She has a history of migraines although never actually diagnosed. These headache episodes occur in her remote history. She was seen in the urgent care within the past month for episodic tension-type headache. She was on vacation a couple of weeks ago and had a headache in her face and forehead. She was told it was likely a sinus headache and treated with antibiotics. Today the headache is primarily over the left forehead, behind the left eye and over the maxillary face. She states it is stabbing type pain she has occasional intermittent mild nausea but no vomiting. Denies current photophobia. Denies problems with vision, speech, hearing, swallowing, focal paresthesias or weakness.      Past Medical History:  Diagnosis Date  . Anemia   . Asthma   . H/O candidiasis   . H/O varicella   . Hypertension   . Irregular periods/menstrual cycles 07/01/09  . Morbid obesity (HCC) 09/17/2015   Past Surgical History:  Procedure Laterality Date  . CHOLECYSTECTOMY  03/20/2012   Procedure: LAPAROSCOPIC CHOLECYSTECTOMY;  Surgeon: Shelly Rubenstein, MD;  Location: [facility];  Service: General;  Laterality: N/A;  . CHOLECYSTECTOMY, LAPAROSCOPIC N/A 2013   Family History  Problem Relation Age of Onset  . Hypertension Mother   . Cancer Maternal Grandmother     breast  . Cancer Maternal Grandfather     colon  . Anesthesia problems Neg Hx    Social History  Substance Use Topics  . Smoking status: Never Smoker  . Smokeless tobacco: Never Used  . Alcohol use No   OB History    Gravida Para Term Preterm AB Living    0 0 2   SAB TAB Ectopic Multiple Live Births   0 0 0 0 2     Review of Systems  Constitutional: Negative for activity change, fatigue and fever.  HENT: Negative for congestion, ear pain, postnasal drip, sore throat, trouble swallowing and voice change.   Eyes: Negative for photophobia, pain and discharge.  Respiratory: Negative.   Cardiovascular: Negative.   Gastrointestinal: Positive for nausea. Negative for vomiting.  Genitourinary: Negative.   Musculoskeletal: Negative.   Skin: Negative.   Neurological: Positive for headaches. Negative for dizziness, tremors, syncope, facial asymmetry, speech difficulty and light-headedness.  Psychiatric/Behavioral: Negative.   All other systems reviewed and are negative.   Allergies  Penicillins; Iodine; Latex; Shellfish allergy; and Tomato  Home Medications   Prior to Admission medications   Medication Sig Start Date End Date Taking? Authorizing Provider  naproxen (NAPROSYN) 375 MG tablet Take 1 tablet (375 mg total) by mouth 2 (two) times daily. 12/16/15   Hayden Rasmussen, NP  traZODone (DESYREL) 100 MG tablet Take 1 tablet (100 mg total) by mouth at bedtime. For sleep 12/02/15   Linna Hoff, MD   Meds Ordered and Administered this Visit   Medications  ketorolac (TORADOL) injection 60 mg (not administered)  dexamethasone (DECADRON) injection 10 mg (not administered)  ondansetron (ZOFRAN-ODT) disintegrating tablet 4 mg (not administered)    BP 132/78 (BP Location: Right Arm)   Pulse 80   Temp 98.6 F (37 C) (  Oral)   Resp 18   LMP 11/25/2015 (Exact Date)   SpO2 100%   Breastfeeding? No  No data found.   Physical Exam  Constitutional: She is oriented to person, place, and time. She appears well-developed and well-nourished. No distress.  HENT:  Head: Normocephalic and atraumatic.  Right Ear: External ear normal.  Left Ear: External ear normal.  Nose: Nose normal.  Mouth/Throat: Oropharynx is clear and moist. No  oropharyngeal exudate.  Bilateral TMs are normal Oropharynx is clear, moist, soft palate rises symmetrically. Uvula and tongue midline. Normal control of tongue. No tenderness to the forehead, temples or maxillary sinuses. No facial tenderness.  Eyes: Conjunctivae and EOM are normal. Pupils are equal, round, and reactive to light. Right eye exhibits no discharge. Left eye exhibits no discharge.  Neck: Normal range of motion. Neck supple.  Cardiovascular: Normal rate, regular rhythm, normal heart sounds and intact distal pulses.   Pulmonary/Chest: Effort normal and breath sounds normal. No respiratory distress.  Abdominal: Soft. There is no tenderness.  Musculoskeletal: Normal range of motion. She exhibits no edema or tenderness.  Lymphadenopathy:    She has no cervical adenopathy.  Neurological: She is alert and oriented to person, place, and time. She has normal strength. She displays no tremor. No cranial nerve deficit or sensory deficit. She exhibits normal muscle tone. Coordination and gait normal. GCS eye subscore is 4. GCS verbal subscore is 5. GCS motor subscore is 6.  Skin: Skin is warm and dry. No rash noted.  Psychiatric: She has a normal mood and affect.  Nursing note and vitals reviewed.   Urgent Care Course   Clinical Course    Procedures (including critical care time)  Labs Review Labs Reviewed - No data to display  Imaging Review No results found.   Visual Acuity Review  Right Eye Distance:   Left Eye Distance:   Bilateral Distance:    Right Eye Near:   Left Eye Near:    Bilateral Near:         MDM   1. Mixed headache   2. Acute nonintractable headache, unspecified headache type    Headache with likely mixed etiology. Some components were similar to migraine others tension and possibly sinus. We will treat with medications below. She may also take Naprosyn for headache pain. Since has recently been diagnosed with sinusitis and she is not taking  anything for that recommend Sudafed PE 10 mg every 4 hours as needed. F/U with PCP ASAP Meds ordered this encounter  Medications  . ketorolac (TORADOL) injection 60 mg  . dexamethasone (DECADRON) injection 10 mg  . ondansetron (ZOFRAN-ODT) disintegrating tablet 4 mg  . naproxen (NAPROSYN) 375 MG tablet    Sig: Take 1 tablet (375 mg total) by mouth 2 (two) times daily.    Dispense:  20 tablet    Refill:  0    Order Specific Question:   Supervising Provider    Answer:   Linna HoffKINDL, JAMES D [[id number]]       Hayden Rasmussenavid Kiaira Pointer, NP 12/16/15 1709    Hayden Rasmussenavid Lauree Yurick, NP 12/16/15 [phone]

## 2015-12-26 DIAGNOSIS — Z3A00 Weeks of gestation of pregnancy not specified: Secondary | ICD-10-CM | POA: Insufficient documentation

## 2015-12-26 DIAGNOSIS — Z791 Long term (current) use of non-steroidal anti-inflammatories (NSAID): Secondary | ICD-10-CM | POA: Insufficient documentation

## 2015-12-26 DIAGNOSIS — J45909 Unspecified asthma, uncomplicated: Secondary | ICD-10-CM | POA: Insufficient documentation

## 2015-12-26 DIAGNOSIS — Z79899 Other long term (current) drug therapy: Secondary | ICD-10-CM | POA: Insufficient documentation

## 2015-12-26 DIAGNOSIS — Z5321 Procedure and treatment not carried out due to patient leaving prior to being seen by health care provider: Secondary | ICD-10-CM | POA: Insufficient documentation

## 2015-12-26 DIAGNOSIS — I10 Essential (primary) hypertension: Secondary | ICD-10-CM | POA: Insufficient documentation

## 2015-12-27 ENCOUNTER — Emergency Department (HOSPITAL_COMMUNITY)
Admission: EM | Admit: 2015-12-27 | Discharge: 2015-12-27 | Discharge status: Against Medical Advice | Payer: Medicaid Other | Attending: Dermatology | Admitting: Dermatology

## 2015-12-27 ENCOUNTER — Encounter (HOSPITAL_COMMUNITY): Payer: Self-pay

## 2015-12-27 NOTE — ED Notes (Signed)
Called patient x 1, no answer.

## 2015-12-27 NOTE — ED Triage Notes (Signed)
Patient c/o HA that she has had for over x1 week.  Patient was seen at [facility] a week ago for the same.  Given an abx for sinus congestion.  Patient states took abx x7 days and still has not gotten any relief.  Denies blurred vision, denies N/V.  Patient rates pain 10/10

## 2016-02-07 ENCOUNTER — Encounter (HOSPITAL_COMMUNITY): Payer: Self-pay | Admitting: Emergency Medicine

## 2016-02-07 ENCOUNTER — Emergency Department (HOSPITAL_COMMUNITY)
Admission: EM | Admit: 2016-02-07 | Discharge: 2016-02-07 | Disposition: A | Discharge status: Home / Self Care | Payer: Medicaid Other | Attending: Emergency Medicine | Admitting: Emergency Medicine

## 2016-02-07 DIAGNOSIS — Z91040 Latex allergy status: Secondary | ICD-10-CM | POA: Insufficient documentation

## 2016-02-07 DIAGNOSIS — J45909 Unspecified asthma, uncomplicated: Secondary | ICD-10-CM | POA: Insufficient documentation

## 2016-02-07 DIAGNOSIS — I10 Essential (primary) hypertension: Secondary | ICD-10-CM | POA: Insufficient documentation

## 2016-02-07 DIAGNOSIS — J069 Acute upper respiratory infection, unspecified: Secondary | ICD-10-CM | POA: Insufficient documentation

## 2016-02-07 NOTE — Discharge Instructions (Signed)
Take over-the-counter medications as needed for symptomatic relief.  Return to the emergency department if you develop chest pain, difficulty breathing, or other new and concerning symptoms.

## 2016-02-07 NOTE — ED Triage Notes (Signed)
Pt c/o body aches, sneezing and cough for one week. Has tried OTC meds and no relief.

## 2016-02-07 NOTE — ED Provider Notes (Signed)
[facility]-EMERGENCY DEPT Provider Note   CSN: [id number] Arrival date & time: 02/07/16  0711     History   Chief Complaint Chief Complaint  Patient presents with  . Cough  . Generalized Body Aches    HPI Karen Simon is a 25 y.o. female.  Patient is a 25 year old female with no significant past medical history. She presents for evaluation of URI symptoms for the past week. She reports congestion, cough, feeling tired. She reports her children are ill with similar symptoms.    URI   This is a new problem. Episode onset: One week ago. The problem has not changed since onset.There has been no fever. Associated symptoms include congestion. Pertinent negatives include no chest pain. She has tried NSAIDs for the symptoms. The treatment provided no relief.    Past Medical History:  Diagnosis Date  . Anemia   . Asthma   . H/O candidiasis   . H/O varicella   . Hypertension   . Irregular periods/menstrual cycles 07/01/09  . Morbid obesity (HCC) 09/17/2015    Patient Active Problem List   Diagnosis Date Noted  . Morbid obesity (HCC) 09/17/2015  . Normal labor 02/23/2014  . Lactating mother 11/27/2011  . Teen pregnancy 11/27/2011  . Pregnant state, incidental 11/25/2011  . Vaginal delivery 11/25/2011  . Perineal laceration complicating delivery 11/25/2011  . Candida albicans infection 11/15/2011  . Excess weight gain in pregnancy 11/07/2011  . Anemia in pregnancy 09/14/2011  . History of high blood pressure - never took meds 04/12/2011  . 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 History of anemia 11/26/2010  . Asthma 11/26/2010  . Migraine 11/26/2010    Past Surgical History:  Procedure Laterality Date  . CHOLECYSTECTOMY  03/20/2012   Procedure: LAPAROSCOPIC CHOLECYSTECTOMY;  Surgeon: Shelly Rubenstein, MD;  Location: [facility];  Service: General;  Laterality: N/A;  . CHOLECYSTECTOMY, LAPAROSCOPIC N/A 2013    OB History    Gravida Para Term  Preterm AB Living   2 2 2  0 0 2   SAB TAB Ectopic Multiple Live Births   0 0 0 0 2       Home Medications    Prior to Admission medications   Medication Sig Start Date End Date Taking? Authorizing Provider  naproxen (NAPROSYN) 375 MG tablet Take 1 tablet (375 mg total) by mouth 2 (two) times daily. 12/16/15   Hayden Rasmussen, NP  traZODone (DESYREL) 100 MG tablet Take 1 tablet (100 mg total) by mouth at bedtime. For sleep 12/02/15   Linna Hoff, MD    Family History Family History  Problem Relation Age of Onset  . Hypertension Mother   . Cancer Maternal Grandmother     breast  . Cancer Maternal Grandfather     colon  . Anesthesia problems Neg Hx     Social History Social History  Substance Use Topics  . Smoking status: Never Smoker  . Smokeless tobacco: Never Used  . Alcohol use No     Allergies   Penicillins; Iodine; Latex; Shellfish allergy; and Tomato   Review of Systems Review of Systems  HENT: Positive for congestion.   Cardiovascular: Negative for chest pain.  All other systems reviewed and are negative.    Physical Exam Updated Vital Signs BP 133/73 (BP Location: Right Arm)   Pulse 80   Temp 97.9 F (36.6 C) (Oral)   Resp 16   LMP 01/17/2016 (Approximate)   SpO2 100%   Physical Exam  Constitutional: She is oriented to person, place, and time. She appears well-developed and well-nourished. No distress.  HENT:  Head: Normocephalic and atraumatic.  Mouth/Throat: Oropharynx is clear and moist. No oropharyngeal exudate.  TMs are clear bilaterally.  Neck: Normal range of motion. Neck supple.  Cardiovascular: Normal rate and regular rhythm.  Exam reveals no gallop and no friction rub.   No murmur heard. Pulmonary/Chest: Effort normal and breath sounds normal. No respiratory distress. She has no wheezes.  Abdominal: Soft. Bowel sounds are normal. She exhibits no distension. There is no tenderness.  Musculoskeletal: Normal range of motion.  Neurological:  She is alert and oriented to person, place, and time.  Skin: Skin is warm and dry. She is not diaphoretic.  Nursing note and vitals reviewed.    ED Treatments / Results  Labs (all labs ordered are listed, but only abnormal results are displayed) Labs Reviewed - No data to display  EKG  EKG Interpretation None       Radiology No results found.  Procedures Procedures (including critical care time)  Medications Ordered in ED Medications - No data to display   Initial Impression / Assessment and Plan / ED Course  I have reviewed the triage vital signs and the nursing notes.  Pertinent labs & imaging results that were available during my care of the patient were reviewed by me and considered in my medical decision making (see chart for details).  Clinical Course    Symptoms most likely viral in nature. Will recommend over-the-counter cough and cold medications as needed for symptom relief. To return as needed for any problems.  The patient was concerned she may have "the flu". She was educated that influenza season has not yet begun and that this is most likely some other viral illness.  Final Clinical Impressions(s) / ED Diagnoses   Final diagnoses:  None    New Prescriptions New Prescriptions   No medications on file     Geoffery Lyonsouglas Octavian Godek, MD 02/07/16 [phone]

## 2016-04-20 ENCOUNTER — Encounter (HOSPITAL_COMMUNITY): Payer: Self-pay | Admitting: *Deleted

## 2016-04-20 ENCOUNTER — Emergency Department (HOSPITAL_COMMUNITY): Payer: Medicaid Other

## 2016-04-20 DIAGNOSIS — I10 Essential (primary) hypertension: Secondary | ICD-10-CM | POA: Insufficient documentation

## 2016-04-20 DIAGNOSIS — R002 Palpitations: Secondary | ICD-10-CM | POA: Insufficient documentation

## 2016-04-20 DIAGNOSIS — J45909 Unspecified asthma, uncomplicated: Secondary | ICD-10-CM | POA: Insufficient documentation

## 2016-04-20 DIAGNOSIS — Z91040 Latex allergy status: Secondary | ICD-10-CM | POA: Insufficient documentation

## 2016-04-20 LAB — CBC
HCT: 34.3 % — ABNORMAL LOW (ref 36.0–46.0)
HEMOGLOBIN: 11.1 g/dL — AB (ref 12.0–15.0)
MCH: 25.9 pg — AB (ref 26.0–34.0)
MCHC: 32.4 g/dL (ref 30.0–36.0)
MCV: 80.1 fL (ref 78.0–100.0)
Platelets: 300 10*3/uL (ref 150–400)
RBC: 4.28 MIL/uL (ref 3.87–5.11)
RDW: 14.1 % (ref 11.5–15.5)
WBC: 9.6 10*3/uL (ref 4.0–10.5)

## 2016-04-20 LAB — I-STAT TROPONIN, ED: TROPONIN I, POC: 0 ng/mL (ref 0.00–0.08)

## 2016-04-20 LAB — BASIC METABOLIC PANEL
ANION GAP: 10 (ref 5–15)
BUN: 13 mg/dL (ref 6–20)
CHLORIDE: 101 mmol/L (ref 101–111)
CO2: 25 mmol/L (ref 22–32)
CREATININE: 0.71 mg/dL (ref 0.44–1.00)
Calcium: 9.3 mg/dL (ref 8.9–10.3)
GFR calc non Af Amer: >60 mL/min (ref >60)
Glucose, Bld: 103 mg/dL — ABNORMAL HIGH (ref 65–99)
Potassium: 3.7 mmol/L (ref 3.5–5.1)
Sodium: 136 mmol/L (ref 135–145)

## 2016-04-20 IMAGING — DX DG CHEST 2V
2 series · 2 of 2 positions shown · non-contrast
Comparison: [DATE] chest radiograph

CLINICAL DATA: 25 y/o F; tachycardia and chest discomfort for 2
nights.

EXAM:
CHEST  2 VIEW

[chest pa]
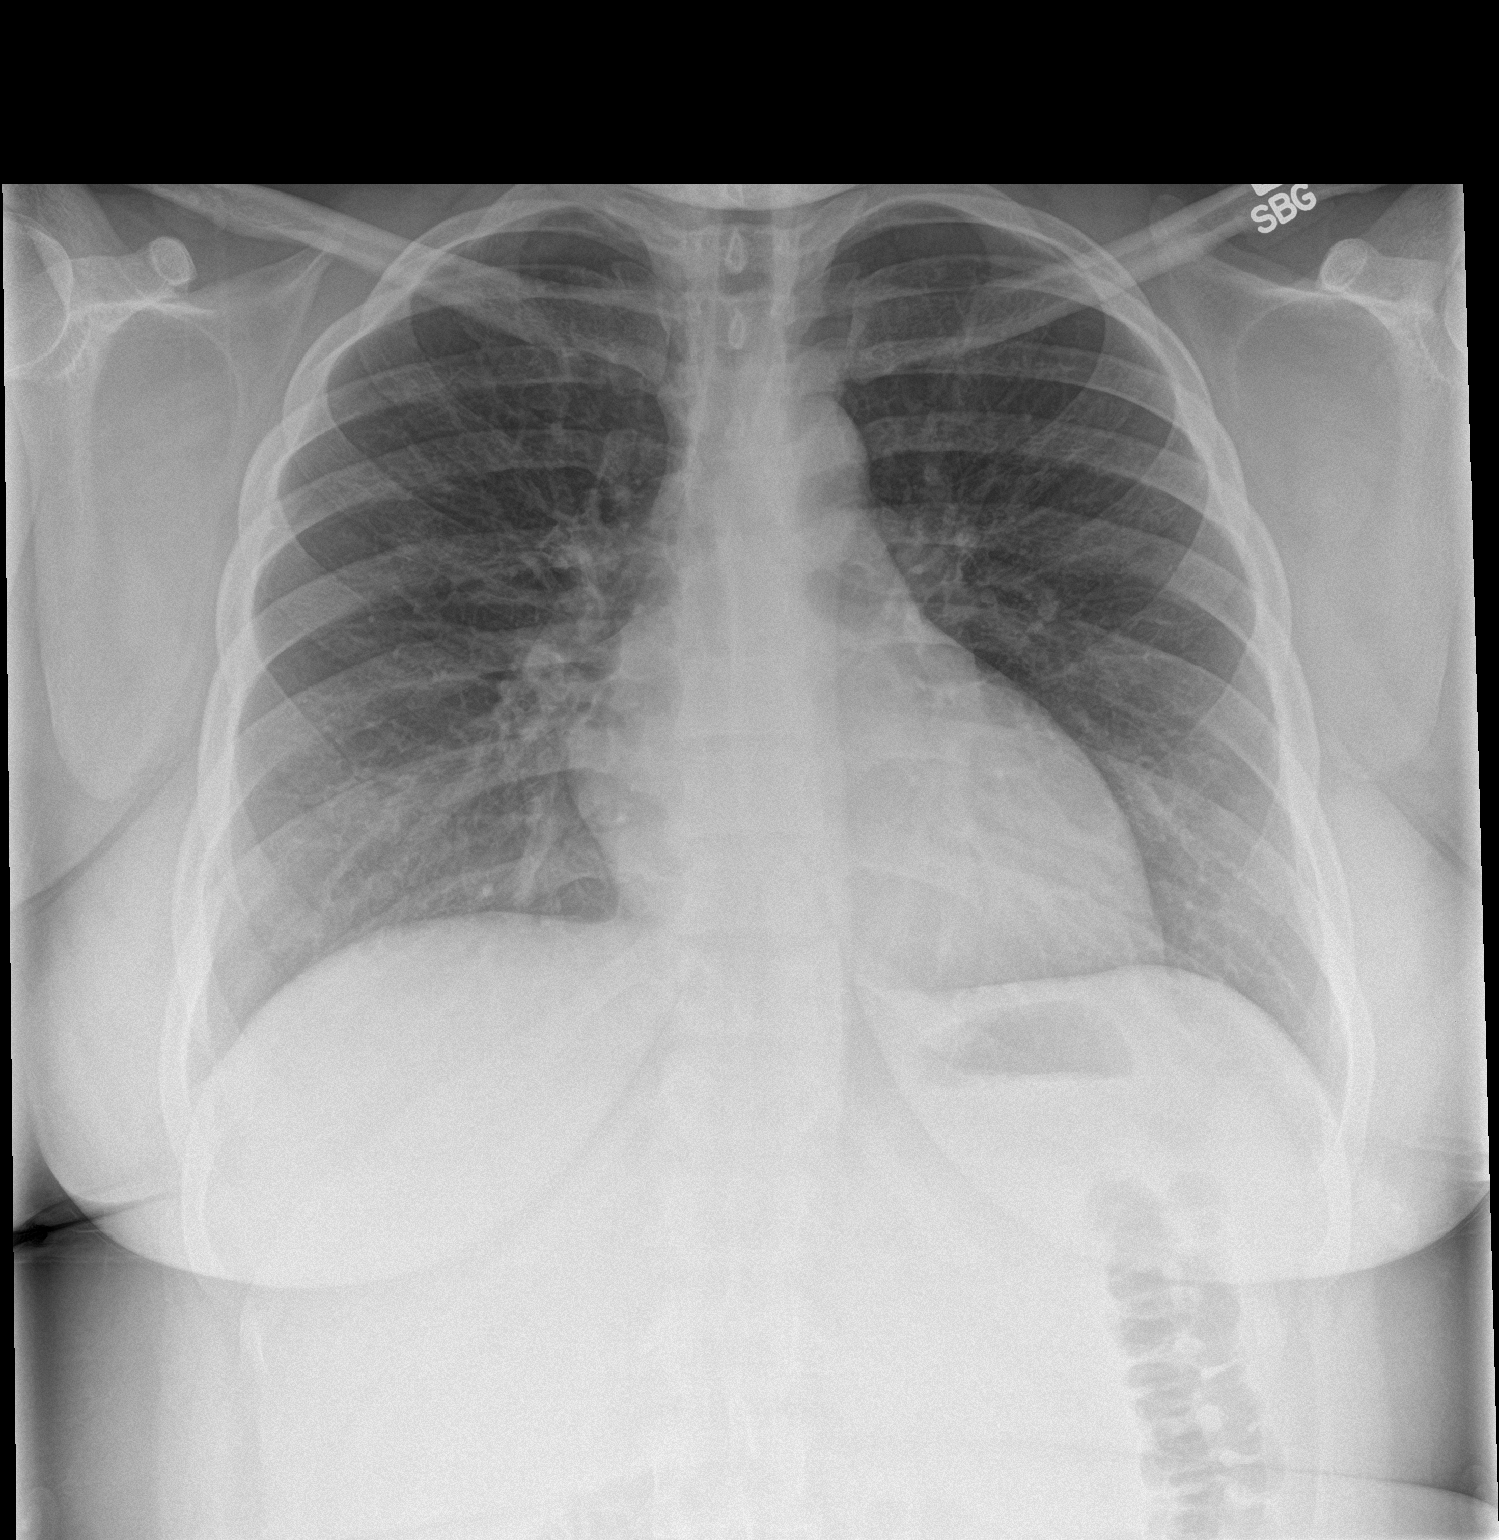

[chest lat]
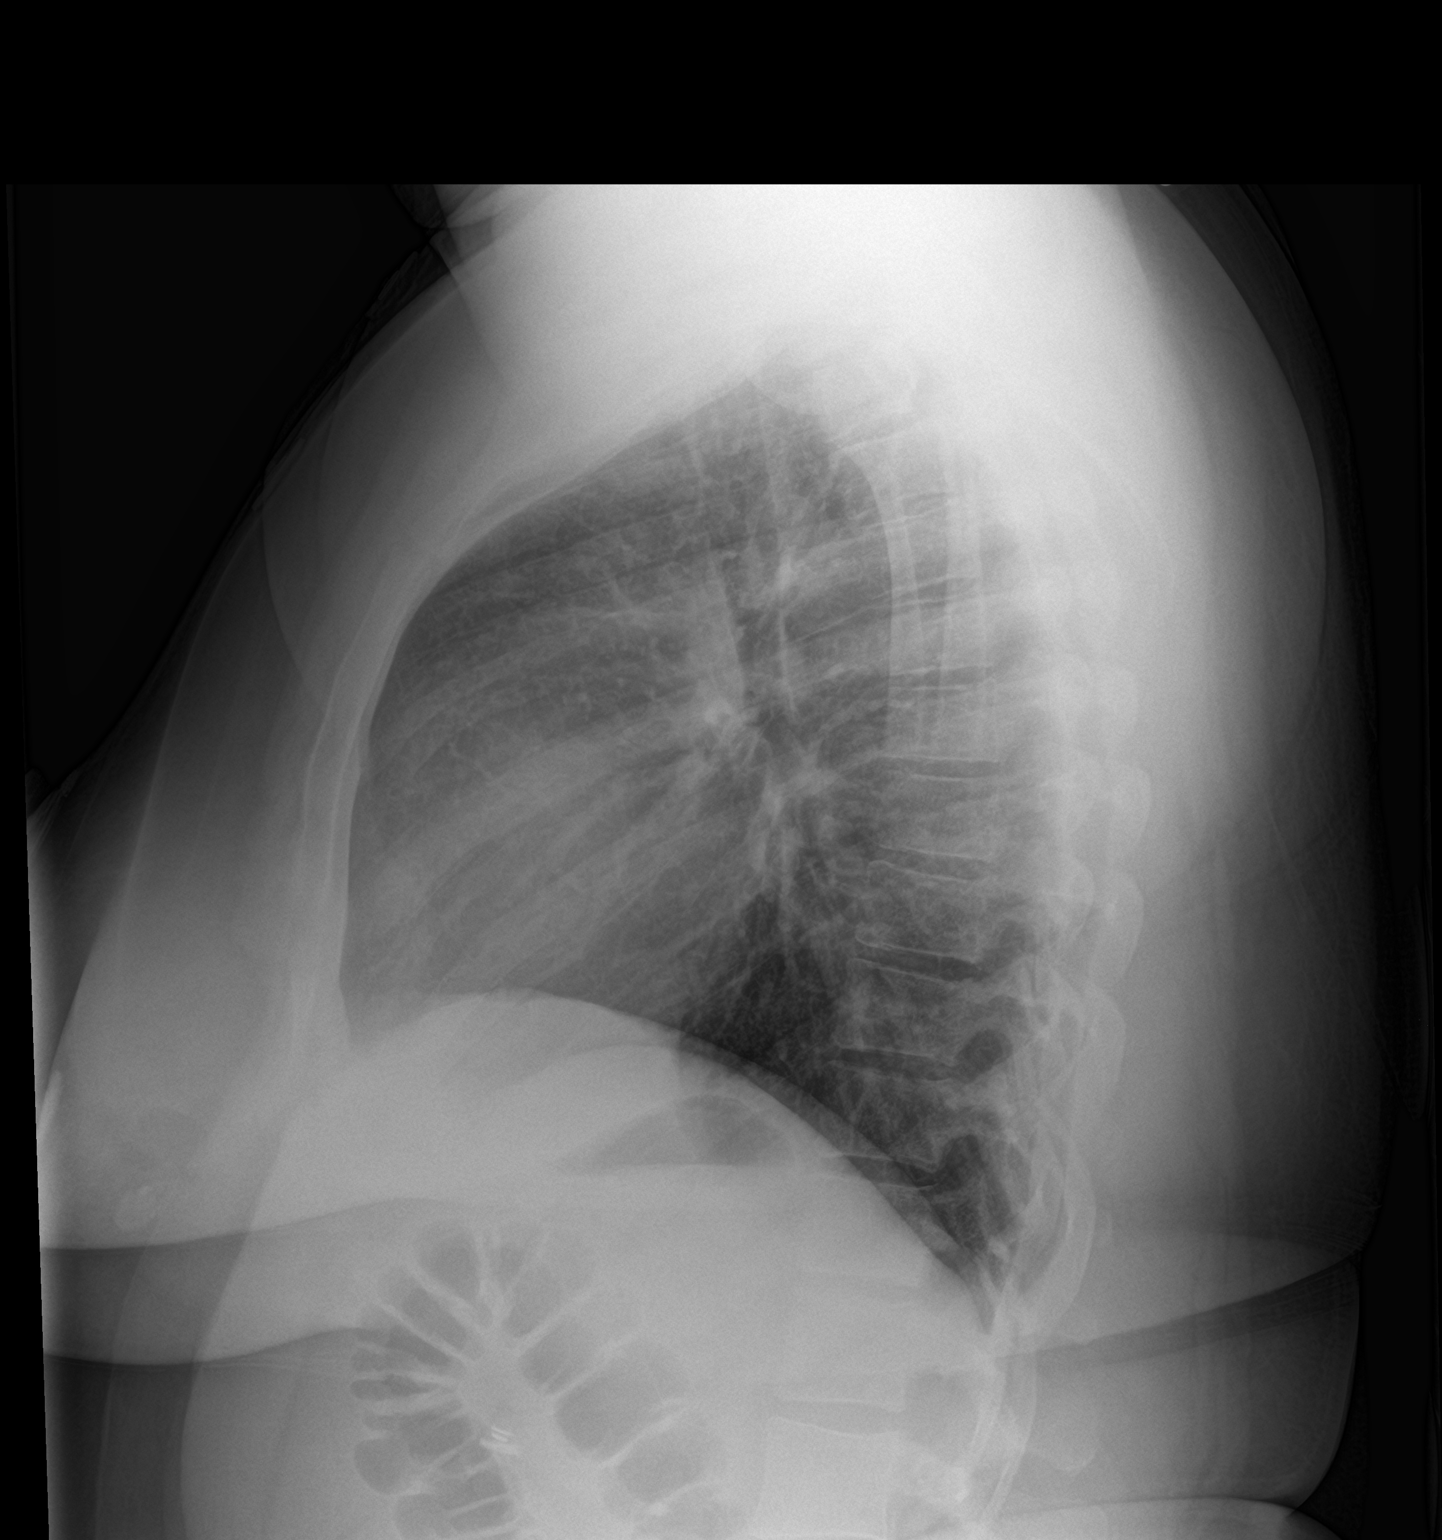

[2 of 2 positions shown; findings below may reference images not displayed]

FINDINGS: Stable borderline enlarged cardiac silhouette given projection and
technique. Clear lungs. No pleural effusion. Minimal levocurvature
of lower thoracic spine. Status post cholecystectomy.
IMPRESSION: Borderline cardiomegaly.  Clear lungs.

By: ABDIRAHMAN M.D.

## 2016-04-20 NOTE — ED Triage Notes (Signed)
Pt c/o palpitations x2 days with sob at night. Reports having an episode several months ago that were milder than the recent episode today. Hx of enlarged heart and high cholesterol

## 2016-04-21 ENCOUNTER — Emergency Department (HOSPITAL_COMMUNITY)
Admission: EM | Admit: 2016-04-21 | Discharge: 2016-04-21 | Disposition: A | Discharge status: Home / Self Care | Payer: Medicaid Other | Attending: Emergency Medicine | Admitting: Emergency Medicine

## 2016-04-21 DIAGNOSIS — R002 Palpitations: Secondary | ICD-10-CM

## 2016-04-21 HISTORY — DX: Cardiomegaly: I51.7

## 2016-04-21 NOTE — ED Provider Notes (Signed)
[facility]-EMERGENCY DEPT Provider Note   CSN: [id number] Arrival date & time: 04/20/16  2112     History   Chief Complaint Chief Complaint  Patient presents with  . Palpitations    HPI Karen Simon is a 25 y.o. female.  Karen Simon is a 25 y.o. Female who presents to the ED complaining of intermittent palpitations for the past three months, worse over the past two nights. She reports it will wake her up at night. She reports it feels like her heart is racing. No skipping beats. She reports with the palpitations it can feel like her chest is tight but denies any chest pain or shortness of breath. She's not been having any palpitations with exertion. No chest pain with exertion. She does tell me she feels anxious with her palpitations. She's had no palpitations since she's been in the emergency department tonight. She does have a history of cardiomegaly and was seen in the emergency department earlier this year for some chest pain. She was seen by cardiology and had a normal echocardiogram. They suspected her chest pain is musculoskeletal. Patient denies personal or close family history of DVT, PE or MI. No recent long travel. She is not a smoker. She denies fevers, coughing, shortness of breath, leg pain, leg swelling, endogenous estrogen use, chest pain, rashes, abdominal pain, nausea, vomiting, lightheadedness, dizziness or syncope.   The history is provided by the patient and medical records. No language interpreter was used.  Palpitations   Pertinent negatives include no fever, no chest pain, no abdominal pain, no nausea, no vomiting, no headaches, no back pain, no cough and no shortness of breath.    Past Medical History:  Diagnosis Date  . Anemia   . Asthma   . Enlarged heart   . H/O candidiasis   . H/O varicella   . Hypertension   . Irregular periods/menstrual cycles 07/01/09  . Morbid obesity (HCC) 09/17/2015    Patient Active Problem List   Diagnosis Date Noted   . Morbid obesity (HCC) 09/17/2015  . Normal labor 02/23/2014  . Lactating mother 11/27/2011  . Teen pregnancy 11/27/2011  . Pregnant state, incidental 11/25/2011  . Vaginal delivery 11/25/2011  . Perineal laceration complicating delivery 11/25/2011  . Candida albicans infection 11/15/2011  . Excess weight gain in pregnancy 11/07/2011  . Anemia in pregnancy 09/14/2011  . History of high blood pressure - never took meds 04/12/2011  . 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 History of anemia 11/26/2010  . Asthma 11/26/2010  . Migraine 11/26/2010    Past Surgical History:  Procedure Laterality Date  . CHOLECYSTECTOMY  03/20/2012   Procedure: LAPAROSCOPIC CHOLECYSTECTOMY;  Surgeon: Shelly Rubenstein, MD;  Location: [facility];  Service: General;  Laterality: N/A;  . CHOLECYSTECTOMY, LAPAROSCOPIC N/A 2013    OB History    Gravida Para Term Preterm AB Living   2 2 2  0 0 2   SAB TAB Ectopic Multiple Live Births   0 0 0 0 2       Home Medications    Prior to Admission medications   Medication Sig Start Date End Date Taking? Authorizing Provider  naproxen (NAPROSYN) 375 MG tablet Take 1 tablet (375 mg total) by mouth 2 (two) times daily. Patient not taking: Reported on 04/21/2016 12/16/15   Hayden Rasmussen, NP  traZODone (DESYREL) 100 MG tablet Take 1 tablet (100 mg total) by mouth at bedtime. For sleep Patient not taking: Reported on 04/21/2016 12/02/15  Linna HoffJames D Kindl, MD    Family History Family History  Problem Relation Age of Onset  . Hypertension Mother   . Cancer Maternal Grandmother     breast  . Cancer Maternal Grandfather     colon  . Anesthesia problems Neg Hx     Social History Social History  Substance Use Topics  . Smoking status: Never Smoker  . Smokeless tobacco: Never Used  . Alcohol use No     Allergies   Penicillins; Iodine; Latex; Shellfish allergy; and Tomato   Review of Systems Review of Systems  Constitutional: Negative for  chills and fever.  HENT: Negative for congestion and sore throat.   Eyes: Negative for visual disturbance.  Respiratory: Negative for cough, shortness of breath and wheezing.   Cardiovascular: Positive for palpitations. Negative for chest pain and leg swelling.  Gastrointestinal: Negative for abdominal pain, diarrhea, nausea and vomiting.  Genitourinary: Negative for dysuria.  Musculoskeletal: Negative for back pain and neck pain.  Skin: Negative for rash.  Neurological: Negative for syncope, light-headedness and headaches.  Psychiatric/Behavioral: The patient is nervous/anxious.      Physical Exam Updated Vital Signs BP 126/80 (BP Location: Right Arm)   Pulse 81   Temp 98.3 F (36.8 C) (Oral)   Resp 16   LMP 03/30/2016   SpO2 100%   Physical Exam  Constitutional: She appears well-developed and well-nourished. No distress.  Nontoxic appearing.  HENT:  Head: Normocephalic and atraumatic.  Mouth/Throat: Oropharynx is clear and moist.  Eyes: Conjunctivae are normal. Pupils are equal, round, and reactive to light. Right eye exhibits no discharge. Left eye exhibits no discharge.  Neck: Normal range of motion. Neck supple. No JVD present. No tracheal deviation present.  Cardiovascular: Normal rate, regular rhythm, normal heart sounds and intact distal pulses.  Exam reveals no gallop and no friction rub.   No murmur heard. Bilateral radial, posterior tibialis and dorsalis pedis pulses are intact.  HR 88 on exam.   Pulmonary/Chest: Effort normal and breath sounds normal. No stridor. No respiratory distress. She has no wheezes. She has no rales. She exhibits no tenderness.  Lungs are clear to auscultation bilaterally. No increased work of breathing. No chest wall tenderness to palpation.  Abdominal: Soft. There is no tenderness.  Musculoskeletal: She exhibits no edema or tenderness.  No lower extremity edema or tenderness.  Lymphadenopathy:    She has no cervical adenopathy.    Neurological: She is alert. Coordination normal.  Skin: Skin is warm and dry. Capillary refill takes less than 2 seconds. No rash noted. She is not diaphoretic. No erythema. No pallor.  Psychiatric: She has a normal mood and affect. Her behavior is normal.  Nursing note and vitals reviewed.    ED Treatments / Results  Labs (all labs ordered are listed, but only abnormal results are displayed) Labs Reviewed  BASIC METABOLIC PANEL - Abnormal; Notable for the following:       Result Value   Glucose, Bld 103 (*)    All other components within normal limits  CBC - Abnormal; Notable for the following:    Hemoglobin 11.1 (*)    HCT 34.3 (*)    MCH 25.9 (*)    All other components within normal limits  Rosezena SensorI-STAT TROPOININ, ED    EKG  EKG Interpretation  Date/Time:  Wednesday April 20 2016 21:26:28 EST Ventricular Rate:  85 PR Interval:  164 QRS Duration: 86 QT Interval:  370 QTC Calculation: 440 R Axis:   71  Text Interpretation:  Normal sinus rhythm Normal ECG Confirmed by Wilkie AyeHORTON  MD, COURTNEY ([id number]) on 04/21/2016 1:40:13 AM       Radiology Dg Chest 2 View  Result Date: 04/20/2016 CLINICAL DATA:  25 y/o F; tachycardia and chest discomfort for 2 nights. EXAM: CHEST  2 VIEW COMPARISON:  11/02/2015 chest radiograph FINDINGS: Stable borderline enlarged cardiac silhouette given projection and technique. Clear lungs. No pleural effusion. Minimal levocurvature of lower thoracic spine. Status post cholecystectomy. IMPRESSION: Borderline cardiomegaly.  Clear lungs. Electronically Signed   By: Mitzi HansenLance  Furusawa-Stratton M.D.   On: 04/20/2016 22:27    Procedures Procedures (including critical care time)  Medications Ordered in ED Medications - No data to display   Initial Impression / Assessment and Plan / ED Course  I have reviewed the triage vital signs and the nursing notes.  Pertinent labs & imaging results that were available during my care of the patient were reviewed by me  and considered in my medical decision making (see chart for details).  Clinical Course    This  is a 25 y.o. Female who presents to the ED complaining of intermittent palpitations for the past three months, worse over the past two nights. She reports it will wake her up at night. She reports it feels like her heart is racing. No skipping beats. She reports with the palpitations it can feel like her chest is tight but denies any chest pain or shortness of breath. She's not been having any palpitations with exertion. No chest pain with exertion. She does tell me she feels anxious with her palpitations. She's had no palpitations since she's been in the emergency department tonight. She does have a history of cardiomegaly and was seen in the emergency department earlier this year for some chest pain. She was seen by cardiology and had a normal echocardiogram. They suspected her chest pain is musculoskeletal. On exam the patient is afebrile nontoxic appearing. EKG shows normal sinus rhythm. Heart rate is 88 on exam. Lungs are clear to auscultation bilaterally. Troponin is not elevated. BMP is unremarkable. CBC is unremarkable. Chest x-ray shows borderline cardiomegaly. This was seen previously. Patient has had an echocardiogram earlier this year that was normal.  Patient is been symptom-free during her emergency department visit. Patient is PERC negative. I am not concerned for PE, ACS or pericarditis at this time. Will discharge patient with follow-up with her cardiologist for possible Holter monitor. I discussed strict and specific return precautions with the patient. I advised the patient to follow-up with their primary care provider this week. I advised the patient to return to the emergency department with new or worsening symptoms or new concerns. The patient verbalized understanding and agreement with plan.    This patient was discussed with Dr. Wilkie AyeHorton who agrees with assessment and plan.   Final  Clinical Impressions(s) / ED Diagnoses   Final diagnoses:  Palpitations    New Prescriptions New Prescriptions   No medications on file     Everlene FarrierWilliam Yanett Conkright, PA-C 04/21/16 [phone]    Shon Batonourtney F Horton, MD 04/24/16 2250

## 2016-04-21 NOTE — ED Notes (Signed)
Pt states she has had palpitations in the past without pain.  Continues over last couple days off and on but "more than it has been"   Denies n/v, respiratory issues.

## 2016-08-02 ENCOUNTER — Encounter (HOSPITAL_BASED_OUTPATIENT_CLINIC_OR_DEPARTMENT_OTHER): Payer: Self-pay | Admitting: Emergency Medicine

## 2016-08-02 ENCOUNTER — Emergency Department (HOSPITAL_BASED_OUTPATIENT_CLINIC_OR_DEPARTMENT_OTHER)
Admission: EM | Admit: 2016-08-02 | Discharge: 2016-08-02 | Disposition: A | Discharge status: Home / Self Care | Payer: Medicaid Other | Attending: Emergency Medicine | Admitting: Emergency Medicine

## 2016-08-02 DIAGNOSIS — I10 Essential (primary) hypertension: Secondary | ICD-10-CM | POA: Insufficient documentation

## 2016-08-02 DIAGNOSIS — J45909 Unspecified asthma, uncomplicated: Secondary | ICD-10-CM | POA: Insufficient documentation

## 2016-08-02 DIAGNOSIS — J209 Acute bronchitis, unspecified: Secondary | ICD-10-CM | POA: Insufficient documentation

## 2016-08-02 MED ORDER — ALBUTEROL SULFATE HFA 108 (90 BASE) MCG/ACT IN AERS
2.0000 | INHALATION_SPRAY | RESPIRATORY_TRACT | Status: DC | PRN
  Administered 2016-08-02: 2 via RESPIRATORY_TRACT
  Filled 2016-08-02: qty 6.7

## 2016-08-02 MED ORDER — ONDANSETRON 8 MG PO TBDP
8.0000 mg | ORAL_TABLET | Freq: Once | ORAL | Status: AC
  Administered 2016-08-02: 8 mg via ORAL
  Filled 2016-08-02: qty 1

## 2016-08-02 NOTE — ED Provider Notes (Signed)
[facility] Provider Note: Lowella DellJ. Lane Kael Keetch, MD, FACEP  CSN: [id number] MRN: [id number] ARRIVAL: 08/02/16 at 0439 ROOM: [facility]06   CHIEF COMPLAINT  Chest Pain   HISTORY OF PRESENT ILLNESS  Karen Simon is a 26 y.o. female with a remote history of asthma. She she woke up about 2 AM with a sense of chest tightness and difficulty breathing. She is also complaining of feeling nauseated but has not vomited. Her symptoms are not severe. She has lost about 20 pounds since her last visit to the ED. She took Aleve prior to arrival with some improvement.   Past Medical History:  Diagnosis Date  . Anemia   . Asthma   . Enlarged heart   . H/O candidiasis   . H/O varicella   . Hypertension   . Irregular periods/menstrual cycles 07/01/09  . Morbid obesity (HCC) 09/17/2015    Past Surgical History:  Procedure Laterality Date  . CHOLECYSTECTOMY  03/20/2012   Procedure: LAPAROSCOPIC CHOLECYSTECTOMY;  Surgeon: Shelly Rubensteinouglas A Blackman, MD;  Location: [facility];  Service: General;  Laterality: N/A;  . CHOLECYSTECTOMY, LAPAROSCOPIC N/A 2013    Family History  Problem Relation Age of Onset  . Hypertension Mother   . Cancer Maternal Grandmother     breast  . Cancer Maternal Grandfather     colon  . Anesthesia problems Neg Hx     Social History  Substance Use Topics  . Smoking status: Never Smoker  . Smokeless tobacco: Never Used  . Alcohol use No    Prior to Admission medications   Medication Sig Start Date End Date Taking? Authorizing Provider  naproxen (NAPROSYN) 375 MG tablet Take 1 tablet (375 mg total) by mouth 2 (two) times daily. Patient not taking: Reported on 04/21/2016 12/16/15   Hayden Rasmussenavid Mabe, NP  traZODone (DESYREL) 100 MG tablet Take 1 tablet (100 mg total) by mouth at bedtime. For sleep Patient not taking: Reported on 04/21/2016 12/02/15   Linna HoffJames D Kindl, MD    Allergies Penicillins; Iodine; Latex; Shellfish allergy; and Tomato   REVIEW OF SYSTEMS  Negative  except as noted here or in the History of Present Illness.   PHYSICAL EXAMINATION  Initial Vital Signs Blood pressure (!) 119/52, pulse 69, temperature 97.9 F (36.6 C), temperature source Oral, resp. rate 18, height 5\' 5"  (1.651 m), weight 275 lb (124.7 kg), SpO2 100 %.  Examination General: Well-developed, obese female in no acute distress; appearance consistent with age of record HENT: normocephalic; atraumatic Eyes: pupils equal, round and reactive to light; extraocular muscles intact Neck: supple Heart: regular rate and rhythm; no murmurs, rubs or gallops Lungs: Decreased air movement bilaterally Chest: Nontender Abdomen: soft; nondistended; nontender; bowel sounds present Extremities: No deformity; full range of motion; pulses normal Neurologic: Awake, alert and oriented; motor function intact in all extremities and symmetric; no facial droop Skin: Warm and dry Psychiatric: Normal mood and affect   RESULTS  Summary of this visit's results, reviewed by myself:   EKG Interpretation  Date/Time:  Tuesday August 02 2016 04:52:22 EDT Ventricular Rate:  65 PR Interval:    QRS Duration: 94 QT Interval:  399 QTC Calculation: 415 R Axis:   63 Text Interpretation:  Sinus rhythm Normal ECG Anterior T-wave inversions no longer present Confirmed by Kailan Carmen  MD, Jonny RuizJOHN ([id number]) on 08/02/2016 4:59:59 AM      Laboratory Studies: No results found for this or any previous visit (from the past 24 hour(s)). Imaging Studies: No results found.  ED  COURSE  Nursing notes and initial vitals signs, including pulse oximetry, reviewed.  Vitals:   08/02/16 0450 08/02/16 0453 08/02/16 0520  BP:  (!) 119/52   Pulse:  69   Resp:  18   Temp:  97.9 F (36.6 C)   TempSrc:  Oral   SpO2:  100% 98%  Weight: 275 lb (124.7 kg)    Height: 5\' 5"  (1.651 m)     5:43 AM Air movement improved, patient feels better after albuterol treatment.  PROCEDURES    ED DIAGNOSES     ICD-9-CM ICD-10-CM   1.  Acute bronchitis with bronchospasm 466.0 J20.9        Paula Libra, MD 08/02/16 [phone]

## 2016-08-02 NOTE — ED Triage Notes (Signed)
Pt reports awaking with chest pressure and feeling nauseated. Pt states she took an aleve prior to coming

## 2016-08-06 ENCOUNTER — Encounter (HOSPITAL_BASED_OUTPATIENT_CLINIC_OR_DEPARTMENT_OTHER): Payer: Self-pay | Admitting: Emergency Medicine

## 2016-08-06 ENCOUNTER — Emergency Department (HOSPITAL_BASED_OUTPATIENT_CLINIC_OR_DEPARTMENT_OTHER)
Admission: EM | Admit: 2016-08-06 | Discharge: 2016-08-06 | Disposition: A | Discharge status: Home / Self Care | Payer: Medicaid Other | Attending: Emergency Medicine | Admitting: Emergency Medicine

## 2016-08-06 DIAGNOSIS — R519 Headache, unspecified: Secondary | ICD-10-CM

## 2016-08-06 DIAGNOSIS — I10 Essential (primary) hypertension: Secondary | ICD-10-CM | POA: Insufficient documentation

## 2016-08-06 DIAGNOSIS — R51 Headache: Secondary | ICD-10-CM | POA: Insufficient documentation

## 2016-08-06 DIAGNOSIS — J45909 Unspecified asthma, uncomplicated: Secondary | ICD-10-CM | POA: Insufficient documentation

## 2016-08-06 HISTORY — DX: Migraine, unspecified, not intractable, without status migrainosus: G43.909

## 2016-08-06 LAB — URINALYSIS, ROUTINE W REFLEX MICROSCOPIC
BILIRUBIN URINE: NEGATIVE
Glucose, UA: NEGATIVE mg/dL
HGB URINE DIPSTICK: NEGATIVE
Ketones, ur: NEGATIVE mg/dL
Leukocytes, UA: NEGATIVE
Nitrite: NEGATIVE
PH: 5.5 (ref 5.0–8.0)
Protein, ur: NEGATIVE mg/dL
SPECIFIC GRAVITY, URINE: 1.03 (ref 1.005–1.030)

## 2016-08-06 LAB — PREGNANCY, URINE: Preg Test, Ur: NEGATIVE

## 2016-08-06 MED ORDER — DIPHENHYDRAMINE HCL 50 MG/ML IJ SOLN
25.0000 mg | Freq: Once | INTRAMUSCULAR | Status: AC
  Administered 2016-08-06: 25 mg via INTRAVENOUS
  Filled 2016-08-06: qty 1

## 2016-08-06 MED ORDER — METOCLOPRAMIDE HCL 5 MG/ML IJ SOLN
10.0000 mg | Freq: Once | INTRAMUSCULAR | Status: AC
Start: 2016-08-06 — End: 2016-08-06
  Administered 2016-08-06: 10 mg via INTRAVENOUS
  Filled 2016-08-06: qty 2

## 2016-08-06 MED ORDER — SODIUM CHLORIDE 0.9 % IV BOLUS (SEPSIS)
1000.0000 mL | Freq: Once | INTRAVENOUS | Status: AC
  Administered 2016-08-06: 1000 mL via INTRAVENOUS

## 2016-08-06 NOTE — ED Provider Notes (Signed)
[facility]   CSN: [id number] Arrival date & time: 08/06/16  2123   By signing my name below, I, Nelwyn Salisbury, attest that this documentation has been prepared under the direction and in the presence of non-physician practitioner, Rhea Bleacher PA-C. Electronically Signed: Nelwyn Salisbury, Scribe. 08/06/2016. 9:36 PM.  History   Chief Complaint Chief Complaint  Patient presents with  . Migraine   The history is provided by the patient. No language interpreter was used.    HPI Comments:  Karen Simon is a 26 y.o. female who presents to the Emergency Department complaining of constant, moderate headache onset 2 days ago. Pt states that her headache is located on the frontal portion of her skull. She notes that she has been diagnosed with migraines and her current symptoms are congruent with migraines she has experienced in the past. No modifying factors indicated. Pt has tried Aleve at home with no relief. She denies any vomiting, photophobia, weakness, numbness, dental pain or sinus pressure.   Past Medical History:  Diagnosis Date  . Anemia   . Asthma   . Enlarged heart   . H/O candidiasis   . H/O varicella   . Hypertension   . Irregular periods/menstrual cycles 07/01/09  . Migraines   . Morbid obesity (HCC) 09/17/2015    Patient Active Problem List   Diagnosis Date Noted  . Morbid obesity (HCC) 09/17/2015  . Normal labor 02/23/2014  . Lactating mother 11/27/2011  . Teen pregnancy 11/27/2011  . Pregnant state, incidental 11/25/2011  . Vaginal delivery 11/25/2011  . Perineal laceration complicating delivery 11/25/2011  . Candida albicans infection 11/15/2011  . Excess weight gain in pregnancy 11/07/2011  . Anemia in pregnancy 09/14/2011  . History of high blood pressure - never took meds 04/12/2011  . 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 History of anemia 11/26/2010  . Asthma 11/26/2010  . Migraine 11/26/2010     Past Surgical History:  Procedure Laterality Date  . CHOLECYSTECTOMY  03/20/2012   Procedure: LAPAROSCOPIC CHOLECYSTECTOMY;  Surgeon: Shelly Rubenstein, MD;  Location: [facility];  Service: General;  Laterality: N/A;  . CHOLECYSTECTOMY, LAPAROSCOPIC N/A 2013    OB History    Gravida Para Term Preterm AB Living   0 0 2   SAB TAB Ectopic Multiple Live Births   0 0 0 0 2       Home Medications    Prior to Admission medications   Not on File    Family History Family History  Problem Relation Age of Onset  . Hypertension Mother   . Cancer Maternal Grandmother     breast  . Cancer Maternal Grandfather     colon  . Anesthesia problems Neg Hx     Social History Social History  Substance Use Topics  . Smoking status: Never Smoker  . Smokeless tobacco: Never Used  . Alcohol use No     Allergies   Penicillins; Iodine; Latex; Shellfish allergy; and Tomato   Review of Systems Review of Systems  Constitutional: Negative for fever.  HENT: Negative for congestion, dental problem, rhinorrhea and sinus pressure.   Eyes: Positive for visual disturbance (blurry vision). Negative for photophobia, discharge and redness.  Respiratory: Negative for shortness of breath.   Cardiovascular: Negative for chest pain.  Gastrointestinal: Negative for nausea and vomiting.  Musculoskeletal: Negative for gait problem, neck pain and neck stiffness.  Skin: Negative for rash.  Neurological: Positive for light-headedness  and headaches. Negative for syncope, speech difficulty, weakness and numbness.  Psychiatric/Behavioral: Negative for confusion.     Physical Exam Updated Vital Signs BP 136/70 (BP Location: Left Arm)   Pulse 74   Temp 98.3 F (36.8 C) (Oral)   Resp 18   Ht  (1.651 m)   Wt 275 lb (124.7 kg)   LMP 07/10/2016   SpO2 100%   BMI 45.76 kg/m   Physical Exam  Constitutional: She is oriented to person, place, and time. She appears well-developed and  well-nourished. No distress.  HENT:  Head: Normocephalic and atraumatic.  Right Ear: Tympanic membrane, external ear and ear canal normal.  Left Ear: Tympanic membrane, external ear and ear canal normal.  Nose: Nose normal.  Mouth/Throat: Uvula is midline, oropharynx is clear and moist and mucous membranes are normal.  Eyes: Conjunctivae, EOM and lids are normal. Pupils are equal, round, and reactive to light. Right eye exhibits no nystagmus. Left eye exhibits no nystagmus.  Neck: Normal range of motion. Neck supple.  No meningismus.  Cardiovascular: Normal rate and regular rhythm.   Pulmonary/Chest: Effort normal and breath sounds normal.  Abdominal: Soft. She exhibits no distension. There is no tenderness.  Musculoskeletal:       Cervical back: She exhibits normal range of motion, no tenderness and no bony tenderness.  Neurological: She is alert and oriented to person, place, and time. She has normal strength and normal reflexes. No cranial nerve deficit or sensory deficit. She displays a negative Romberg sign. Coordination and gait normal. GCS eye subscore is 4. GCS verbal subscore is 5. GCS motor subscore is 6.  CN 3-12 grossly intact. 5/5 strength in all 4 extremities. Grossly normal sensation.   Skin: Skin is warm and dry.  Psychiatric: She has a normal mood and affect.  Nursing note and vitals reviewed.    ED Treatments / Results  DIAGNOSTIC STUDIES:  Oxygen Saturation is 100% on RA, normal by my interpretation.    COORDINATION OF CARE:  9:39 PM Discussed treatment plan with pt at bedside which includes Reglan for her headache and pt agreed to plan.  Labs (all labs ordered are listed, but only abnormal results are displayed) Labs Reviewed  PREGNANCY, URINE  URINALYSIS, ROUTINE W REFLEX MICROSCOPIC   Procedures Procedures (including critical care time)  Medications Ordered in ED Medications - No data to display   Initial Impression / Assessment and Plan / ED Course    I have reviewed the triage vital signs and the nursing notes.  Pertinent labs & imaging results that were available during my care of the patient were reviewed by me and considered in my medical decision making (see chart for details).     Vital signs reviewed and are as follows: Vitals:   08/06/16 2127  BP: 136/70  Pulse: 74  Resp: 18  Temp: 98.3 F (36.8 C)   11:06 PM Patient feels much better. She is ready for discharge to home. She has a ride home.  Encouraged rest upon returning home. Follow-up with PCP this week if headaches continue.  Patient counseled to return if they have weakness in their arms or legs, slurred speech, trouble walking or talking, confusion, trouble with their balance, or if they have any other concerns. Patient verbalizes understanding and agrees with plan.    Final Clinical Impressions(s) / ED Diagnoses   Final diagnoses:  Acute nonintractable headache, unspecified headache type   Patient with headache similar to previous however she is lightheaded today.  Urine checked and is negative for pregnancy or infection. Patient without high-risk features of headache including: sudden onset/thunderclap HA, no similar headache in past, altered mental status, accompanying seizure, headache with exertion, age > 20, history of immunocompromise, neck or shoulder pain, fever, use of anticoagulation, family history of spontaneous SAH, concomitant drug use, toxic exposure.   Patient has a normal complete neurological exam, normal vital signs, normal level of consciousness, no signs of meningismus, is well-appearing/non-toxic appearing, no signs of trauma.   Imaging with CT/MRI not indicated given history and physical exam findings.   No dangerous or life-threatening conditions suspected or identified by history, physical exam, and by work-up. No indications for hospitalization identified.    New Prescriptions New Prescriptions   No medications on file  I personally  performed the services described in this documentation, which was scribed in my presence. The recorded information has been reviewed and is accurate.     Renne Crigler, PA-C 08/06/16 2308    Geoffery Lyons, MD 08/06/16 [phone]

## 2016-08-06 NOTE — ED Notes (Signed)
Pt given d/c instructions as per chart. Verbalizes understanding. No questions. 

## 2016-08-06 NOTE — Discharge Instructions (Signed)
Please read and follow all provided instructions.  Your diagnoses today include:  1. Acute nonintractable headache, unspecified headache type     Tests performed today include:  Vital signs. See below for your results today.   Medications:  In the Emergency Department you received:  Reglan - antinausea/headache medication  Benadryl - antihistamine to counteract potential side effects of reglan  Take any prescribed medications only as directed.  Additional information:  Follow any educational materials contained in this packet.  You are having a headache. No specific cause was found today for your headache. It may have been a migraine or other cause of headache. Stress, anxiety, fatigue, and depression are common triggers for headaches.   Your headache today does not appear to be life-threatening or require hospitalization, but often the exact cause of headaches is not determined in the emergency department. Therefore, follow-up with your doctor is very important to find out what may have caused your headache and whether or not you need any further diagnostic testing or treatment.   Sometimes headaches can appear benign (not harmful), but then more serious symptoms can develop which should prompt an immediate re-evaluation by your doctor or the emergency department.  BE VERY CAREFUL not to take multiple medicines containing Tylenol (also called acetaminophen). Doing so can lead to an overdose which can damage your liver and cause liver failure and possibly death.   Follow-up instructions: Please follow-up with your primary care provider in the next 3 days for further evaluation of your symptoms.   Return instructions:   Please return to the Emergency Department if you experience worsening symptoms.  Return if the medications do not resolve your headache, if it recurs, or if you have multiple episodes of vomiting or cannot keep down fluids.  Return if you have a change from the  usual headache.  RETURN IMMEDIATELY IF you:  Develop a sudden, severe headache  Develop confusion or become poorly responsive or faint  Develop a fever above 100.47F or problem breathing  Have a change in speech, vision, swallowing, or understanding  Develop new weakness, numbness, tingling, incoordination in your arms or legs  Have a seizure  Please return if you have any other emergent concerns.  Additional Information:  Your vital signs today were: BP 136/70 (BP Location: Left Arm)    Pulse 74    Temp 98.3 F (36.8 C) (Oral)    Resp 18    Ht  (1.651 m)    Wt 124.7 kg    LMP 07/10/2016    SpO2 100%    BMI 45.76 kg/m  If your blood pressure (BP) was elevated above 135/85 this visit, please have this repeated by your doctor within one month. --------------

## 2016-08-06 NOTE — ED Notes (Signed)
Pt given cup to attempt urine sample. 

## 2016-08-06 NOTE — ED Notes (Signed)
Patient complaint of migraine but this time she is experiencing blurry vision and lightheaded and feeling like fainting.

## 2016-08-06 NOTE — ED Triage Notes (Signed)
PT presents to ED with c/o migraines for the past 3 days and blurred vision.

## 2016-08-13 ENCOUNTER — Encounter (HOSPITAL_COMMUNITY): Payer: Self-pay

## 2016-08-13 ENCOUNTER — Emergency Department (HOSPITAL_COMMUNITY)
Admission: EM | Admit: 2016-08-13 | Discharge: 2016-08-13 | Disposition: A | Discharge status: Home / Self Care | Payer: Medicaid Other | Attending: Emergency Medicine | Admitting: Emergency Medicine

## 2016-08-13 DIAGNOSIS — R42 Dizziness and giddiness: Secondary | ICD-10-CM | POA: Insufficient documentation

## 2016-08-13 DIAGNOSIS — J45909 Unspecified asthma, uncomplicated: Secondary | ICD-10-CM | POA: Insufficient documentation

## 2016-08-13 DIAGNOSIS — R519 Headache, unspecified: Secondary | ICD-10-CM

## 2016-08-13 DIAGNOSIS — Z91040 Latex allergy status: Secondary | ICD-10-CM | POA: Insufficient documentation

## 2016-08-13 DIAGNOSIS — R51 Headache: Secondary | ICD-10-CM | POA: Insufficient documentation

## 2016-08-13 LAB — BASIC METABOLIC PANEL
ANION GAP: 9 (ref 5–15)
BUN: 10 mg/dL (ref 6–20)
CO2: 26 mmol/L (ref 22–32)
Calcium: 9.4 mg/dL (ref 8.9–10.3)
Chloride: 102 mmol/L (ref 101–111)
Creatinine, Ser: 0.64 mg/dL (ref 0.44–1.00)
GFR calc Af Amer: >60 mL/min (ref >60)
Glucose, Bld: 108 mg/dL — ABNORMAL HIGH (ref 65–99)
POTASSIUM: 3.7 mmol/L (ref 3.5–5.1)
SODIUM: 137 mmol/L (ref 135–145)

## 2016-08-13 LAB — CBG MONITORING, ED: GLUCOSE-CAPILLARY: 87 mg/dL (ref 65–99)

## 2016-08-13 LAB — URINALYSIS, ROUTINE W REFLEX MICROSCOPIC
BILIRUBIN URINE: NEGATIVE
Bacteria, UA: NONE SEEN
GLUCOSE, UA: NEGATIVE mg/dL
HGB URINE DIPSTICK: NEGATIVE
Ketones, ur: NEGATIVE mg/dL
Leukocytes, UA: NEGATIVE
NITRITE: NEGATIVE
Protein, ur: NEGATIVE mg/dL
SPECIFIC GRAVITY, URINE: 1.02 (ref 1.005–1.030)
pH: 5 (ref 5.0–8.0)

## 2016-08-13 LAB — POC URINE PREG, ED: Preg Test, Ur: NEGATIVE

## 2016-08-13 LAB — CBC
HEMATOCRIT: 35.3 % — AB (ref 36.0–46.0)
HEMOGLOBIN: 11.3 g/dL — AB (ref 12.0–15.0)
MCH: 25.5 pg — ABNORMAL LOW (ref 26.0–34.0)
MCHC: 32 g/dL (ref 30.0–36.0)
MCV: 79.7 fL (ref 78.0–100.0)
Platelets: 286 10*3/uL (ref 150–400)
RBC: 4.43 MIL/uL (ref 3.87–5.11)
RDW: 13.8 % (ref 11.5–15.5)
WBC: 10 10*3/uL (ref 4.0–10.5)

## 2016-08-13 MED ORDER — IBUPROFEN 600 MG PO TABS: 600.0000 mg | ORAL_TABLET | Freq: Four times a day (QID) | ORAL | 0 refills | Status: DC | PRN

## 2016-08-13 MED ORDER — KETOROLAC TROMETHAMINE 30 MG/ML IJ SOLN
30.0000 mg | Freq: Once | INTRAMUSCULAR | Status: AC
  Administered 2016-08-13: 30 mg via INTRAVENOUS
  Filled 2016-08-13: qty 1

## 2016-08-13 MED ORDER — METOCLOPRAMIDE HCL 10 MG PO TABS: 10.0000 mg | ORAL_TABLET | Freq: Three times a day (TID) | ORAL | 0 refills | Status: DC | PRN

## 2016-08-13 MED ORDER — METOCLOPRAMIDE HCL 5 MG/ML IJ SOLN
10.0000 mg | Freq: Once | INTRAMUSCULAR | Status: AC
  Administered 2016-08-13: 10 mg via INTRAVENOUS
  Filled 2016-08-13: qty 2

## 2016-08-13 MED ORDER — DIPHENHYDRAMINE HCL 50 MG/ML IJ SOLN
25.0000 mg | Freq: Once | INTRAMUSCULAR | Status: AC
  Administered 2016-08-13: 25 mg via INTRAVENOUS
  Filled 2016-08-13: qty 1

## 2016-08-13 MED ORDER — SODIUM CHLORIDE 0.9 % IV BOLUS (SEPSIS)
1000.0000 mL | Freq: Once | INTRAVENOUS | Status: AC
  Administered 2016-08-13: 1000 mL via INTRAVENOUS

## 2016-08-13 NOTE — ED Provider Notes (Signed)
[facility] Provider Note   CSN: [id number] Arrival date & time: 08/13/16  1856     History   Chief Complaint Chief Complaint  Patient presents with  . Headache  . Extremity Weakness    HPI Karen Simon is a 26 y.o. female.  HPI Patient with history of chronic headaches presents with gradual onset right occipital headache for the past week. Associated with generalized weakness. No numbness. States she's had lightheadedness but denies vertiginous symptoms. No visual changes. No photophobia, nausea or vomiting. Denies fever or chills. Past Medical History:  Diagnosis Date  . Anemia   . Asthma   . Enlarged heart   . H/O candidiasis   . H/O varicella   . Irregular periods/menstrual cycles 07/01/09  . Migraines   . Morbid obesity (HCC) 09/17/2015    Patient Active Problem List   Diagnosis Date Noted  . Morbid obesity (HCC) 09/17/2015  . Normal labor 02/23/2014  . Lactating mother 11/27/2011  . Teen pregnancy 11/27/2011  . Pregnant state, incidental 11/25/2011  . Vaginal delivery 11/25/2011  . Perineal laceration complicating delivery 11/25/2011  . Candida albicans infection 11/15/2011  . Excess weight gain in pregnancy 11/07/2011  . Anemia in pregnancy 09/14/2011  . History of high blood pressure - never took meds 04/12/2011  . 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 History of anemia 11/26/2010  . Asthma 11/26/2010  . Migraine 11/26/2010    Past Surgical History:  Procedure Laterality Date  . CHOLECYSTECTOMY  03/20/2012   Procedure: LAPAROSCOPIC CHOLECYSTECTOMY;  Surgeon: Shelly Rubenstein, MD;  Location: [facility];  Service: General;  Laterality: N/A;  . CHOLECYSTECTOMY, LAPAROSCOPIC N/A 2013    OB History    Gravida Para Term Preterm AB Living   0 0 2   SAB TAB Ectopic Multiple Live Births   0 0 0 0 2       Home Medications    Prior to Admission medications   Medication Sig Start Date End Date Taking?  Authorizing Provider  ibuprofen (ADVIL,MOTRIN) 600 MG tablet Take 1 tablet (600 mg total) by mouth every 6 (six) hours as needed. 08/13/16   Loren Racer, MD  metoCLOPramide (REGLAN) 10 MG tablet Take 1 tablet (10 mg total) by mouth every 8 (eight) hours as needed for nausea (headache). 08/13/16   Loren Racer, MD    Family History Family History  Problem Relation Age of Onset  . Hypertension Mother   . Cancer Maternal Grandmother     breast  . Cancer Maternal Grandfather     colon  . Anesthesia problems Neg Hx     Social History Social History  Substance Use Topics  . Smoking status: Never Smoker  . Smokeless tobacco: Never Used  . Alcohol use Yes     Comment: occassionally      Allergies   Penicillins; Iodine; Latex; Shellfish allergy; and Tomato   Review of Systems Review of Systems  Constitutional: Negative for chills and fever.  HENT: Negative for congestion, sinus pain, sinus pressure and sore throat.   Eyes: Negative for photophobia and visual disturbance.  Respiratory: Negative for shortness of breath.   Cardiovascular: Negative for chest pain.  Gastrointestinal: Negative for abdominal pain and nausea.  Genitourinary: Negative for dysuria, flank pain and frequency.  Musculoskeletal: Negative for back pain, myalgias, neck pain and neck stiffness.  Skin: Negative for rash and wound.  Neurological: Positive for dizziness, weakness (generalized), light-headedness and headaches. Negative for syncope,  facial asymmetry, speech difficulty and numbness.  All other systems reviewed and are negative.    Physical Exam Updated Vital Signs BP (!) 104/58   Pulse 77   Temp 98.2 F (36.8 C) (Oral)   Resp 20   Ht  (1.651 m)   Wt 275 lb (124.7 kg)   LMP 07/18/2016 (Exact Date)   SpO2 98%   BMI 45.76 kg/m   Physical Exam  Constitutional: She is oriented to person, place, and time. She appears well-developed and well-nourished. No distress.  HENT:  Head:  Normocephalic and atraumatic.  Mouth/Throat: Oropharynx is clear and moist.  Oropharynx is clear. No sinus tenderness to percussion.  Eyes: EOM are normal. Pupils are equal, round, and reactive to light.  Neck: Normal range of motion. Neck supple.  No posterior midline cervical tenderness to palpation. No meningismus.  Cardiovascular: Normal rate and regular rhythm.   Pulmonary/Chest: Effort normal and breath sounds normal. No respiratory distress. She has no wheezes. She has no rales. She exhibits no tenderness.  Abdominal: Soft. Bowel sounds are normal. There is no tenderness. There is no rebound and no guarding.  Musculoskeletal: Normal range of motion. She exhibits no edema or tenderness.  No lower extremity swelling, asymmetry or tenderness. 2+ pulses in all extremities.  Neurological: She is alert and oriented to person, place, and time.  Patient is alert and oriented x3 with clear, goal oriented speech. Patient has 5/5 motor in all extremities. Sensation is intact to light touch. Bilateral finger-to-nose is normal with no signs of dysmetria. Patient has a normal gait and walks without assistance.  Skin: Skin is warm and dry. Capillary refill takes less than 2 seconds. No rash noted. No erythema.  Psychiatric: She has a normal mood and affect. Her behavior is normal.  Nursing note and vitals reviewed.    ED Treatments / Results  Labs (all labs ordered are listed, but only abnormal results are displayed) Labs Reviewed  BASIC METABOLIC PANEL - Abnormal; Notable for the following:       Result Value   Glucose, Bld 108 (*)    All other components within normal limits  CBC - Abnormal; Notable for the following:    Hemoglobin 11.3 (*)    HCT 35.3 (*)    MCH 25.5 (*)    All other components within normal limits  URINALYSIS, ROUTINE W REFLEX MICROSCOPIC - Abnormal; Notable for the following:    APPearance HAZY (*)    Squamous Epithelial / LPF 0-5 (*)    All other components within  normal limits  CBG MONITORING, ED  POC URINE PREG, ED    EKG  EKG Interpretation None       Radiology No results found.  Procedures Procedures (including critical care time)  Medications Ordered in ED Medications  sodium chloride 0.9 % bolus 1,000 mL (1,000 mLs Intravenous New Bag/Given 08/13/16 2055)  metoCLOPramide (REGLAN) injection 10 mg (10 mg Intravenous Given 08/13/16 2055)  ketorolac (TORADOL) 30 MG/ML injection 30 mg (30 mg Intravenous Given 08/13/16 2055)  diphenhydrAMINE (BENADRYL) injection 25 mg (25 mg Intravenous Given 08/13/16 2055)     Initial Impression / Assessment and Plan / ED Course  I have reviewed the triage vital signs and the nursing notes.  Pertinent labs & imaging results that were available during my care of the patient were reviewed by me and considered in my medical decision making (see chart for details).     Patient with right occipital headache. She has generalized  weakness but a normal neurologic exam. Do not believe that emergent imaging is necessary at this point. We'll treat symptomatically.  Patient states her headache has resolved. Improved lightheadedness and generalized weakness. Continues to have a normal neurologic exam. Have encouraged her to drink plenty of fluids to stay hydrated. Patient did have some hypertension and increased heart rate when standing. Question postural dysautonomia. Will o follow-up with cardiology. Regarding patient's frequent headaches, we'll give referral to neurology. Return precautions been given. Final Clinical Impressions(s) / ED Diagnoses   Final diagnoses:  Headache in back of head  Postural lightheadedness    New Prescriptions New Prescriptions   IBUPROFEN (ADVIL,MOTRIN) 600 MG TABLET    Take 1 tablet (600 mg total) by mouth every 6 (six) hours as needed.   METOCLOPRAMIDE (REGLAN) 10 MG TABLET    Take 1 tablet (10 mg total) by mouth every 8 (eight) hours as needed for nausea (headache).     Loren Racer, MD 08/13/16 2211

## 2016-08-13 NOTE — ED Triage Notes (Signed)
Pt presents to the ed for headaches and generalized weakness x 1 week off and on, for the last hour and a half it has been more persistent.  The patient has a history of migraines and this does not feel the same. Pt is ambulatory.  Alert and oriented, denies any other symptoms like fevers or vomiting.

## 2016-08-13 NOTE — ED Notes (Signed)
Pt reports being weak with a headache for a week now. Pt has not been evaluated by her PCP. Pt was ambulatory from triage with no problems or SOB.

## 2016-09-27 IMAGING — US US ART/VEN ABD/PELV/SCROTUM DOPPLER LTD
1 series · 13 of 25 positions shown · non-contrast
Comparison: None.

CLINICAL DATA: Left lower quadrant pain for 1 week. No known
injury.

EXAM:
TRANSABDOMINAL AND TRANSVAGINAL ULTRASOUND OF PELVIS
DOPPLER ULTRASOUND OF OVARIES
TECHNIQUE: Both transabdominal and transvaginal ultrasound examinations of the
pelvis were performed. Transabdominal technique was performed for
global imaging of the pelvis including uterus, ovaries, adnexal
regions, and pelvic cul-de-sac.
It was necessary to proceed with endovaginal exam following the
transabdominal exam to visualize the adnexa. Color and duplex
Doppler ultrasound was utilized to evaluate blood flow to the
ovaries.

[Series 1: us art/ven abd/pelv/scrotum doppler ltd · 0.27mm/px · 13 of 56 slices shown]
[im 1/56]
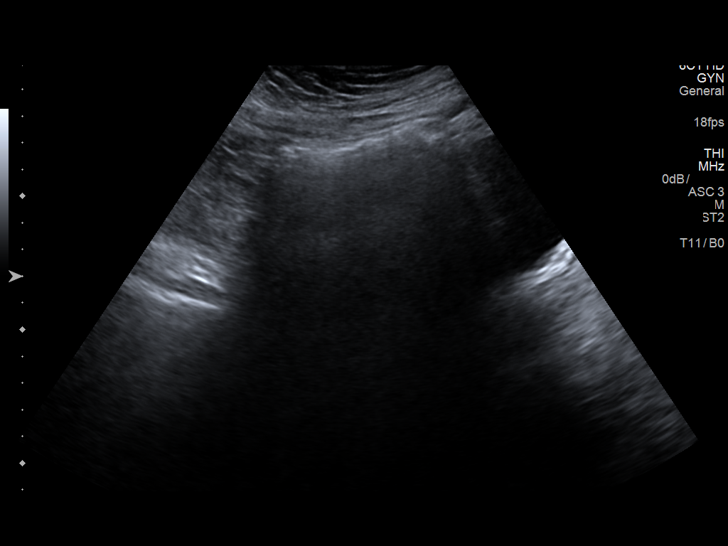
[im 5/56]
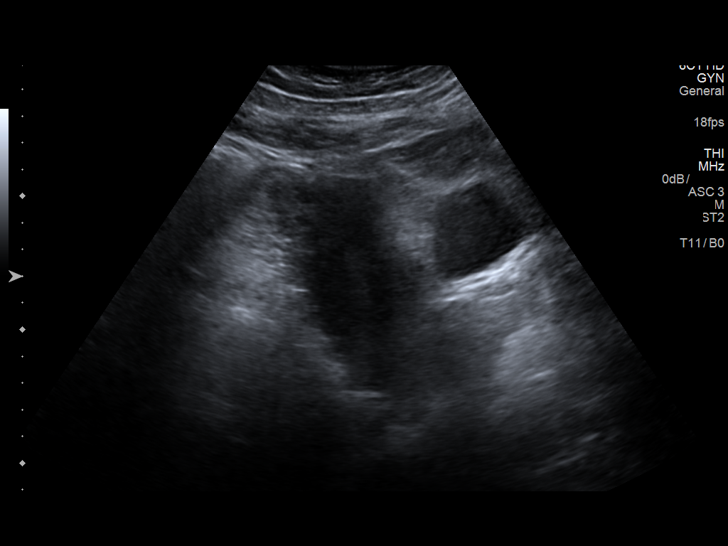
[im 10/56]
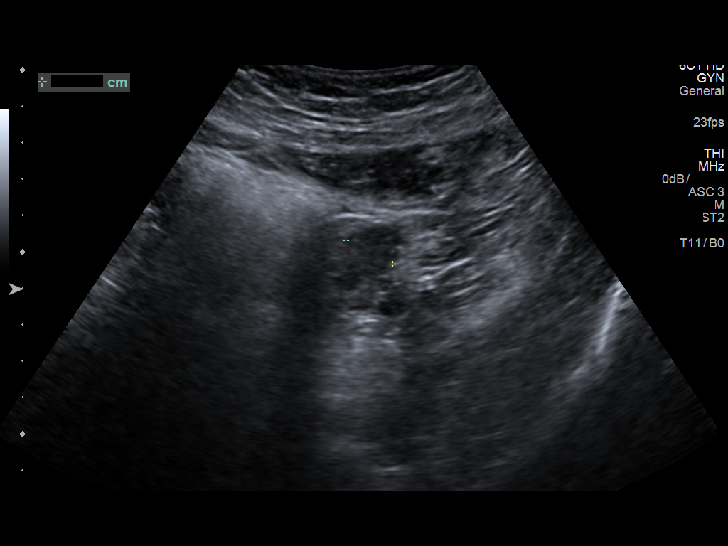
[im 14/56]
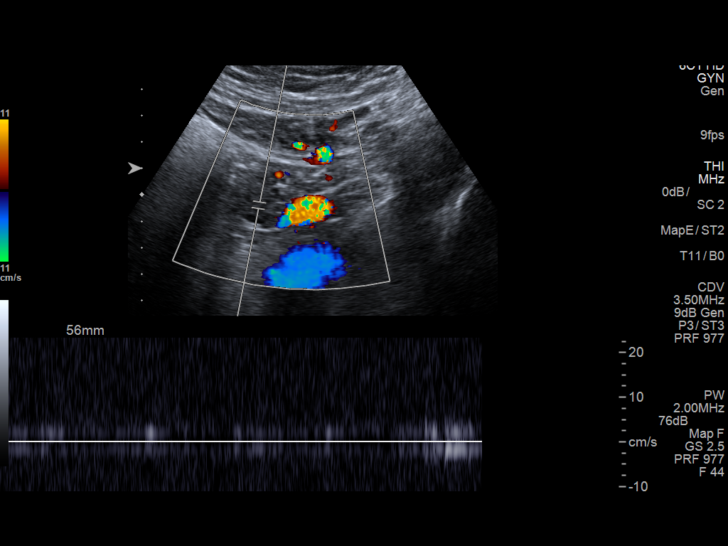
[im 19/56]
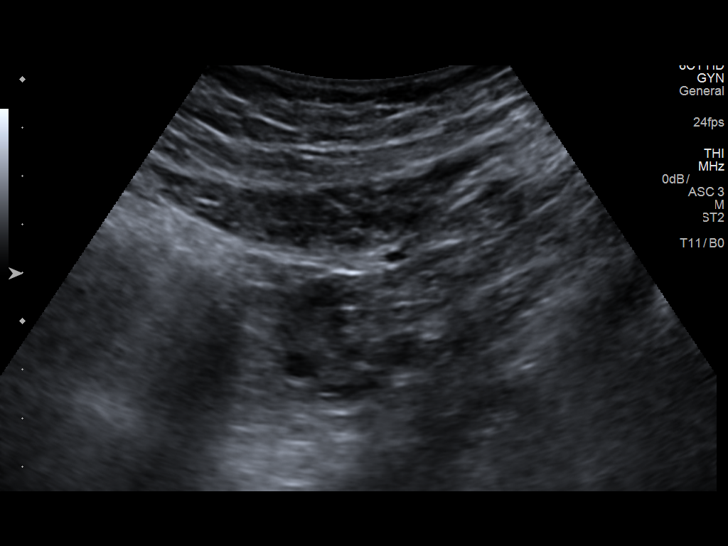
[im 23/56]
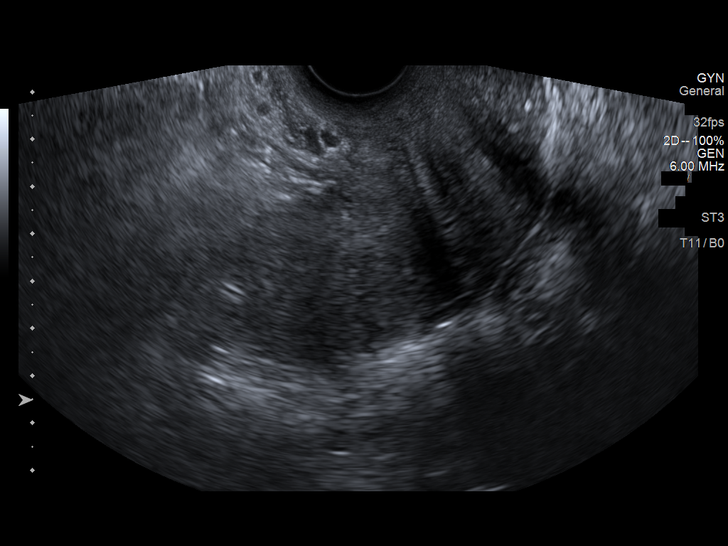
[im 28/56]
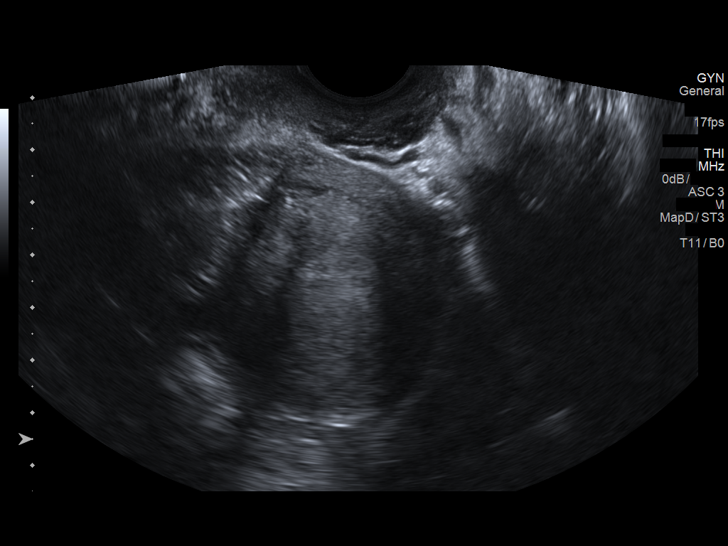
[im 33/56]
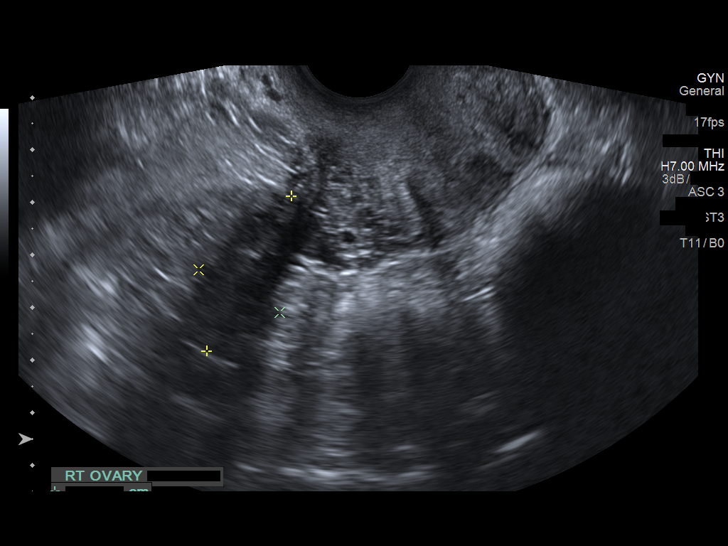
[im 37/56]
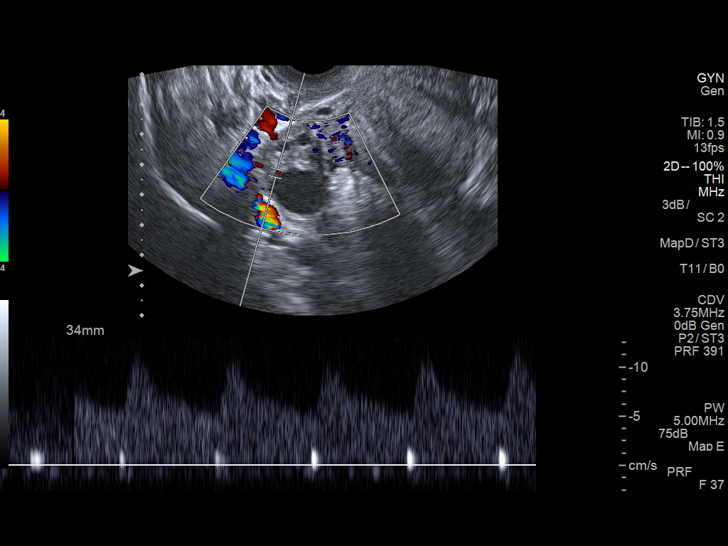
[im 42/56]
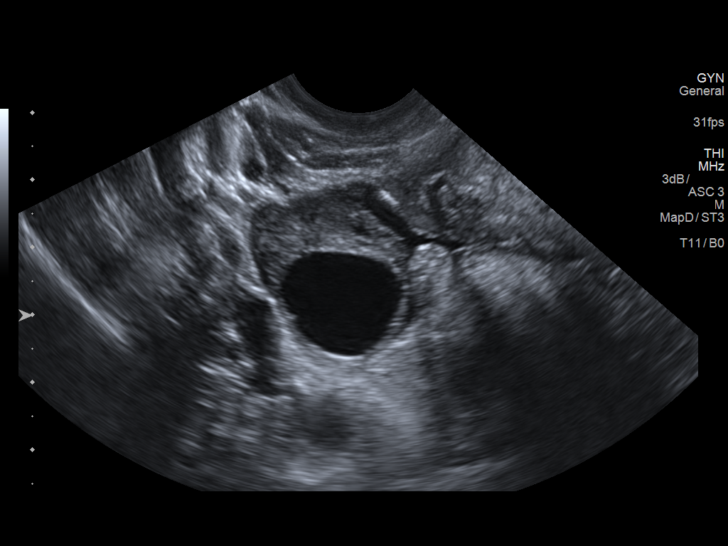
[im 46/56]
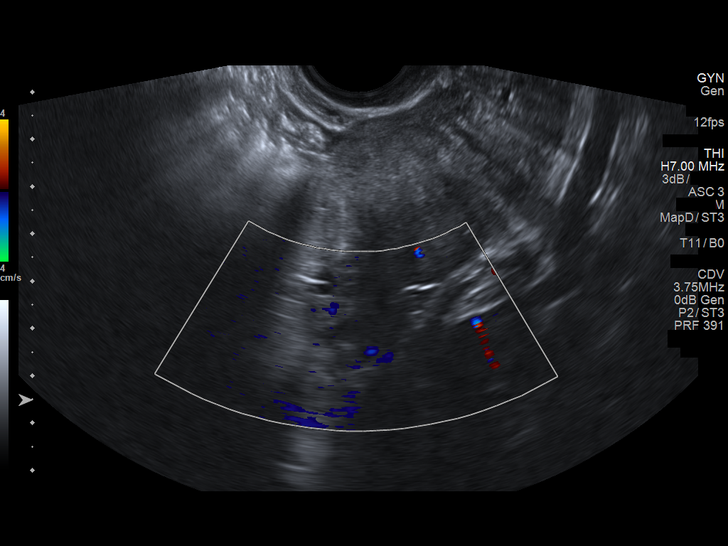
[im 51/56]
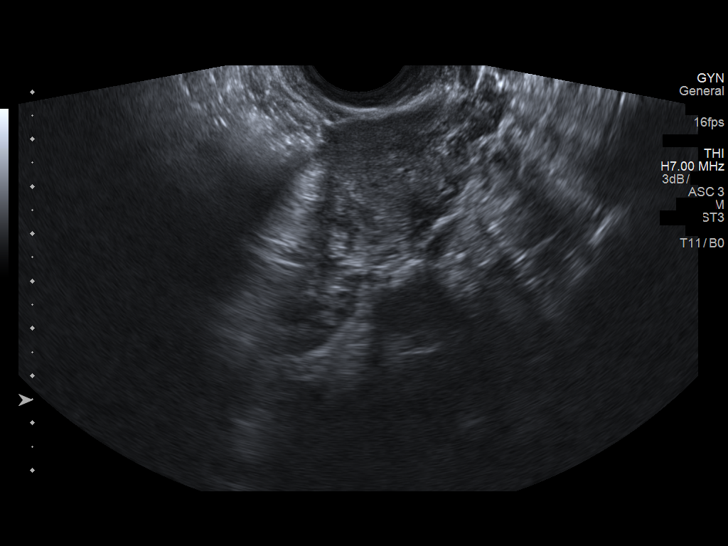
[im 56/56]
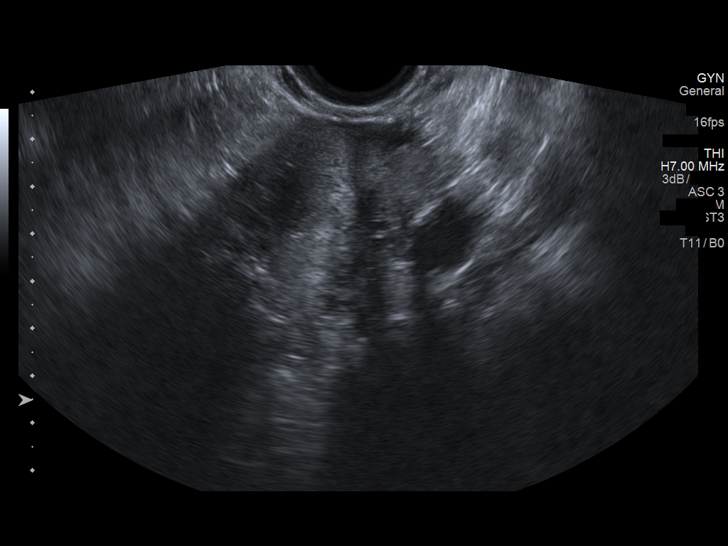

[13 of 25 positions shown; findings below may reference images not displayed]

FINDINGS: Uterus

Measurements: 8.8 x 4.6 x 5.2 cm. No fibroids or other mass
visualized.

Endometrium

Thickness: 10 mm.  No focal abnormality visualized.

Right ovary

Measurements: 3.4 x 2.4 x 2.1 cm. Simple cyst versus dominant
follicle measuring 1.8 cm in diameter incidentally noted.

Left ovary

Measurements: 3.4 x 1.9 x 2.1 cm. Normal appearance/no adnexal mass.

Pulsed Doppler evaluation of both ovaries demonstrates normal
low-resistance arterial and venous waveforms.

Other findings

No abnormal free fluid.
IMPRESSION: Negative exam.  No finding to explain the patient's symptoms.

## 2016-10-14 ENCOUNTER — Emergency Department (HOSPITAL_COMMUNITY)
Admission: EM | Admit: 2016-10-14 | Discharge: 2016-10-14 | Disposition: A | Discharge status: Home / Self Care | Payer: Medicaid Other | Attending: Emergency Medicine | Admitting: Emergency Medicine

## 2016-10-14 ENCOUNTER — Encounter (HOSPITAL_COMMUNITY): Payer: Self-pay | Admitting: Emergency Medicine

## 2016-10-14 DIAGNOSIS — Z79899 Other long term (current) drug therapy: Secondary | ICD-10-CM | POA: Insufficient documentation

## 2016-10-14 DIAGNOSIS — G609 Hereditary and idiopathic neuropathy, unspecified: Secondary | ICD-10-CM | POA: Insufficient documentation

## 2016-10-14 DIAGNOSIS — J45909 Unspecified asthma, uncomplicated: Secondary | ICD-10-CM | POA: Insufficient documentation

## 2016-10-14 DIAGNOSIS — Z91040 Latex allergy status: Secondary | ICD-10-CM | POA: Insufficient documentation

## 2016-10-14 LAB — COMPREHENSIVE METABOLIC PANEL
ALBUMIN: 3.6 g/dL (ref 3.5–5.0)
ALT: 24 U/L (ref 14–54)
ANION GAP: 7 (ref 5–15)
AST: 21 U/L (ref 15–41)
Alkaline Phosphatase: 74 U/L (ref 38–126)
BUN: 15 mg/dL (ref 6–20)
CHLORIDE: 106 mmol/L (ref 101–111)
CO2: 25 mmol/L (ref 22–32)
Calcium: 9 mg/dL (ref 8.9–10.3)
Creatinine, Ser: 0.61 mg/dL (ref 0.44–1.00)
GFR calc non Af Amer: >60 mL/min (ref >60)
GLUCOSE: 109 mg/dL — AB (ref 65–99)
POTASSIUM: 3.7 mmol/L (ref 3.5–5.1)
SODIUM: 138 mmol/L (ref 135–145)
Total Bilirubin: 0.2 mg/dL — ABNORMAL LOW (ref 0.3–1.2)
Total Protein: 7.2 g/dL (ref 6.5–8.1)

## 2016-10-14 LAB — CBC WITH DIFFERENTIAL/PLATELET
Basophils Absolute: 0 10*3/uL (ref 0.0–0.1)
Basophils Relative: 0 %
Eosinophils Absolute: 0.2 10*3/uL (ref 0.0–0.7)
Eosinophils Relative: 2 %
HEMATOCRIT: 34.1 % — AB (ref 36.0–46.0)
HEMOGLOBIN: 10.9 g/dL — AB (ref 12.0–15.0)
LYMPHS ABS: 3.4 10*3/uL (ref 0.7–4.0)
LYMPHS PCT: 37 %
MCH: 25.5 pg — AB (ref 26.0–34.0)
MCHC: 32 g/dL (ref 30.0–36.0)
MCV: 79.9 fL (ref 78.0–100.0)
MONO ABS: 0.4 10*3/uL (ref 0.1–1.0)
MONOS PCT: 5 %
NEUTROS ABS: 5.1 10*3/uL (ref 1.7–7.7)
NEUTROS PCT: 56 %
Platelets: 259 10*3/uL (ref 150–400)
RBC: 4.27 MIL/uL (ref 3.87–5.11)
RDW: 14.4 % (ref 11.5–15.5)
WBC: 9.2 10*3/uL (ref 4.0–10.5)

## 2016-10-14 LAB — CBG MONITORING, ED: GLUCOSE-CAPILLARY: 98 mg/dL (ref 65–99)

## 2016-10-14 LAB — I-STAT BETA HCG BLOOD, ED (MC, WL, AP ONLY): I-stat hCG, quantitative: <5 m[IU]/mL (ref <5)

## 2016-10-14 NOTE — ED Provider Notes (Signed)
[facility]-EMERGENCY DEPT Provider Note   CSN: [id number] Arrival date & time: 10/14/16  0041     History   Chief Complaint Chief Complaint  Patient presents with  . Numbness    HPI Karen Simon is a 26 y.o. female.  26 year old female with a history of anemia, asthma, and migraine headaches presents to the emergency department for extremity paresthesias. Patient states that her left arm feels "like a blown up blood pressure cuff is on my arm" 2 weeks. Symptoms are intermittent, but have become more frequent and constant over the past 2 days. She states that is what initially last for approximately 1 hour before spontaneously resolving; her LUE from her elbow distally is primarily affected. Patient denies any known aggravating or alleviating factors of her symptoms. She has taken ibuprofen without relief. She states that she lifts her children frequently, but is right hand and arm dominant. She denies any other strenuous activity or heavy lifting. No falls, trauma, or injury. Patient denies any neck pain or back pain. No extremity weakness. No recent fevers.   The history is provided by the patient. No language interpreter was used.    Past Medical History:  Diagnosis Date  . Anemia   . Asthma   . Enlarged heart   . H/O candidiasis   . H/O varicella   . Irregular periods/menstrual cycles 07/01/09  . Migraines   . Morbid obesity (HCC) 09/17/2015    Patient Active Problem List   Diagnosis Date Noted  . Morbid obesity (HCC) 09/17/2015  . Normal labor 02/23/2014  . Lactating mother 11/27/2011  . Teen pregnancy 11/27/2011  . Pregnant state, incidental 11/25/2011  . Vaginal delivery 11/25/2011  . Perineal laceration complicating delivery 11/25/2011  . Candida albicans infection 11/15/2011  . Excess weight gain in pregnancy 11/07/2011  . Anemia in pregnancy 09/14/2011  . History of high blood pressure - never took meds 04/12/2011  . 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 Shellfish  allergy 04/12/2011  . History of anemia 11/26/2010  . Asthma 11/26/2010  . Migraine 11/26/2010    Past Surgical History:  Procedure Laterality Date  . CHOLECYSTECTOMY  03/20/2012   Procedure: LAPAROSCOPIC CHOLECYSTECTOMY;  Surgeon: Shelly Rubenstein, MD;  Location: [facility]S;  Service: General;  Laterality: N/A;  . CHOLECYSTECTOMY, LAPAROSCOPIC N/A 2013    OB History    Gravida Para Term Preterm AB Living   2 2 2  0 0 2   SAB TAB Ectopic Multiple Live Births   0 0 0 0 2       Home Medications    Prior to Admission medications   Medication Sig Start Date End Date Taking? Authorizing Provider  ibuprofen (ADVIL,MOTRIN) 600 MG tablet Take 1 tablet (600 mg total) by mouth every 6 (six) hours as needed. 08/13/16   Loren Racer, MD  metoCLOPramide (REGLAN) 10 MG tablet Take 1 tablet (10 mg total) by mouth every 8 (eight) hours as needed for nausea (headache). 08/13/16   Loren Racer, MD    Family History Family History  Problem Relation Age of Onset  . Hypertension Mother   . Cancer Maternal Grandmother        breast  . Cancer Maternal Grandfather        colon  . Anesthesia problems Neg Hx     Social History Social History  Substance Use Topics  . Smoking status: Never Smoker  . Smokeless tobacco: Never Used  . Alcohol use Yes     Comment: occassionally  Allergies   Penicillins; Iodine; Latex; Shellfish allergy; and Tomato   Review of Systems Review of Systems Ten systems reviewed and are negative for acute change, except as noted in the HPI.    Physical Exam Updated Vital Signs BP 129/80 (BP Location: Left Arm)   Pulse 88   Temp 98 F (36.7 C) (Oral)   Resp 18   Ht 5\' 5"  (1.651 m)   Wt 127 kg (280 lb)   LMP 09/16/2016   SpO2 100%   BMI 46.59 kg/m   Physical Exam  Constitutional: She is oriented to person, place, and time. She appears well-developed and well-nourished. No distress.  Nontoxic and in no acute distress  HENT:  Head:  Normocephalic and atraumatic.  Eyes: Conjunctivae and EOM are normal. No scleral icterus.  Neck: Normal range of motion.  No tenderness to palpation to the cervical midline or paraspinal muscles. No nuchal rigidity or meningismus  Cardiovascular: Normal rate, regular rhythm and intact distal pulses.   Distal radial pulse 2+ bilaterally  Pulmonary/Chest: Effort normal. No respiratory distress.  Respirations even and unlabored  Musculoskeletal: Normal range of motion.  Neurological: She is alert and oriented to person, place, and time. She exhibits normal muscle tone. Coordination normal.  Grip strength 5/5 bilaterally. Strength against resistance 5/5 in all major muscle groups of the bilateral upper extremities. Sensation to light touch intact and equal bilaterally. Normal shoulder shrugging against resistance.  Skin: Skin is warm and dry. No rash noted. She is not diaphoretic. No erythema. No pallor.  Psychiatric: She has a normal mood and affect. Her behavior is normal.  Nursing note and vitals reviewed.    ED Treatments / Results  Labs (all labs ordered are listed, but only abnormal results are displayed) Labs Reviewed  COMPREHENSIVE METABOLIC PANEL - Abnormal; Notable for the following:       Result Value   Glucose, Bld 109 (*)    Total Bilirubin 0.2 (*)    All other components within normal limits  CBC WITH DIFFERENTIAL/PLATELET - Abnormal; Notable for the following:    Hemoglobin 10.9 (*)    HCT 34.1 (*)    MCH 25.5 (*)    All other components within normal limits  URINALYSIS, ROUTINE W REFLEX MICROSCOPIC  CBG MONITORING, ED  I-STAT BETA HCG BLOOD, ED ([facility], [facility], [facility] ONLY)    EKG  EKG Interpretation None       Radiology No results found.  Procedures Procedures (including critical care time)  Medications Ordered in ED Medications - No data to display   Initial Impression / Assessment and Plan / ED Course  I have reviewed the triage vital signs and the nursing  notes.  Pertinent labs & imaging results that were available during my care of the patient were reviewed by me and considered in my medical decision making (see chart for details).     Patient presents for symptoms of paresthesias in her left upper extremity. No history of trauma or injury. No complaints of neck pain, back pain, or chest pain. No fever or c/o headache. No known modifying factors of symptoms. Patient neurovascularly intact on exam. She reports intact and equal sensation. Strength rated at 5/5.  Paresthesias are of unknown etiology. Have advised follow-up with a neurologist for an outpatient nerve conduction study to further evaluate cause of peripheral neuropathy. Symptoms are not consistent with CVA or TIA. Return precautions discussed and provided. Patient discharged in stable condition with no unaddressed concerns.   Final Clinical Impressions(s) /  ED Diagnoses   Final diagnoses:  Peripheral neuropathy, idiopathic    New Prescriptions New Prescriptions   No medications on file     Antony MaduraHumes, Elisabet Gutzmer, Cordelia Poche-C 10/14/16 0327    Dione BoozeGlick, David, MD 10/14/16 [phone]

## 2016-10-14 NOTE — ED Triage Notes (Signed)
Patient is complaining of heart burn and complaining that her left arm is going limp/numb. Patient is not complaining of any other symptoms. This has been going on for about two weeks.

## 2016-10-14 NOTE — ED Notes (Signed)
Pt is alert and oriented x 4 pt reports having numbness in her  forearm that extends down to her left hand. Pt states that she has been having the pain x 2 weeks.

## 2016-10-14 NOTE — Discharge Instructions (Signed)
We advised follow-up with a neurologist. You would also benefit from a nerve conduction study. This is completed by a neurologist as well. Follow-up with your primary care doctor as needed, especially if symptoms persist.

## 2016-11-29 ENCOUNTER — Telehealth: Payer: Self-pay | Admitting: *Deleted

## 2016-11-29 ENCOUNTER — Ambulatory Visit: Payer: Self-pay | Admitting: Neurology

## 2016-11-29 NOTE — Telephone Encounter (Signed)
Showed for new pt appt but Medicaid is for family planning only.  Unable to be seen.  She is going to have to contact Medicaid for assistance with her coverage. She will need to call back to reschedule once the problem has been resolved.

## 2016-12-16 ENCOUNTER — Ambulatory Visit: Payer: Medicaid Other | Admitting: Neurology

## 2016-12-19 ENCOUNTER — Encounter: Payer: Self-pay | Admitting: Neurology

## 2017-02-21 ENCOUNTER — Encounter (HOSPITAL_BASED_OUTPATIENT_CLINIC_OR_DEPARTMENT_OTHER): Payer: Self-pay

## 2017-02-21 ENCOUNTER — Emergency Department (HOSPITAL_BASED_OUTPATIENT_CLINIC_OR_DEPARTMENT_OTHER)
Admission: EM | Admit: 2017-02-21 | Discharge: 2017-02-21 | Disposition: A | Discharge status: Home / Self Care | Payer: Self-pay | Attending: Emergency Medicine | Admitting: Emergency Medicine

## 2017-02-21 DIAGNOSIS — R110 Nausea: Secondary | ICD-10-CM | POA: Insufficient documentation

## 2017-02-21 DIAGNOSIS — Z91040 Latex allergy status: Secondary | ICD-10-CM | POA: Insufficient documentation

## 2017-02-21 DIAGNOSIS — J45909 Unspecified asthma, uncomplicated: Secondary | ICD-10-CM | POA: Insufficient documentation

## 2017-02-21 LAB — URINALYSIS, ROUTINE W REFLEX MICROSCOPIC
Bilirubin Urine: NEGATIVE
Glucose, UA: NEGATIVE mg/dL
Hgb urine dipstick: NEGATIVE
Ketones, ur: NEGATIVE mg/dL
LEUKOCYTES UA: NEGATIVE
NITRITE: NEGATIVE
PH: 6.5 (ref 5.0–8.0)
Protein, ur: NEGATIVE mg/dL
SPECIFIC GRAVITY, URINE: 1.015 (ref 1.005–1.030)

## 2017-02-21 LAB — COMPREHENSIVE METABOLIC PANEL
ALT: 22 U/L (ref 14–54)
AST: 18 U/L (ref 15–41)
Albumin: 3.7 g/dL (ref 3.5–5.0)
Alkaline Phosphatase: 74 U/L (ref 38–126)
Anion gap: 6 (ref 5–15)
BILIRUBIN TOTAL: 0.2 mg/dL — AB (ref 0.3–1.2)
BUN: 10 mg/dL (ref 6–20)
CO2: 25 mmol/L (ref 22–32)
CREATININE: 0.76 mg/dL (ref 0.44–1.00)
Calcium: 9.1 mg/dL (ref 8.9–10.3)
Chloride: 103 mmol/L (ref 101–111)
Glucose, Bld: 100 mg/dL — ABNORMAL HIGH (ref 65–99)
Potassium: 3.7 mmol/L (ref 3.5–5.1)
Sodium: 134 mmol/L — ABNORMAL LOW (ref 135–145)
TOTAL PROTEIN: 7.7 g/dL (ref 6.5–8.1)

## 2017-02-21 LAB — LIPASE, BLOOD: LIPASE: 31 U/L (ref 11–51)

## 2017-02-21 LAB — CBC
HEMATOCRIT: 34.8 % — AB (ref 36.0–46.0)
Hemoglobin: 10.9 g/dL — ABNORMAL LOW (ref 12.0–15.0)
MCH: 24.9 pg — ABNORMAL LOW (ref 26.0–34.0)
MCHC: 31.3 g/dL (ref 30.0–36.0)
MCV: 79.6 fL (ref 78.0–100.0)
PLATELETS: 298 10*3/uL (ref 150–400)
RBC: 4.37 MIL/uL (ref 3.87–5.11)
RDW: 14.4 % (ref 11.5–15.5)
WBC: 9.6 10*3/uL (ref 4.0–10.5)

## 2017-02-21 LAB — PREGNANCY, URINE: Preg Test, Ur: NEGATIVE

## 2017-02-21 MED ORDER — OMEPRAZOLE 20 MG PO CPDR: 20.0000 mg | DELAYED_RELEASE_CAPSULE | Freq: Every day | ORAL | 0 refills | Status: DC

## 2017-02-21 MED ORDER — ONDANSETRON 8 MG PO TBDP: 8.0000 mg | ORAL_TABLET | Freq: Three times a day (TID) | ORAL | 0 refills | Status: DC | PRN

## 2017-02-21 MED ORDER — ONDANSETRON 8 MG PO TBDP
8.0000 mg | ORAL_TABLET | Freq: Once | ORAL | Status: AC
Start: 2017-02-21 — End: 2017-02-21
  Administered 2017-02-21: 8 mg via ORAL
  Filled 2017-02-21: qty 1

## 2017-02-21 NOTE — ED Provider Notes (Signed)
[facility] EMERGENCY DEPARTMENT Provider Note   CSN: [id number] Arrival date & time: 02/21/17  2034     History   Chief Complaint Chief Complaint  Patient presents with  . Nausea    HPI Karen Simon is a 26 y.o. female.  HPI Patient is a 26 year old female presents to emergency department with persistent nausea without vomiting over the past 2 weeks. She states that she seems to be constantly nauseated and food does not help. She denies chest pain shortness of breath. Denies back pain. No urinary symptoms. Denies diarrhea. No blood in her stool. Patient has never had symptoms like this before. Is a moderate in severity.   Past Medical History:  Diagnosis Date  . Anemia   . Asthma   . Enlarged heart   . H/O candidiasis   . H/O varicella   . Irregular periods/menstrual cycles 07/01/09  . Migraines   . Morbid obesity (HCC) 09/17/2015    Patient Active Problem List   Diagnosis Date Noted  . Morbid obesity (HCC) 09/17/2015  . Normal labor 02/23/2014  . Lactating mother 11/27/2011  . Teen pregnancy 11/27/2011  . Pregnant state, incidental 11/25/2011  . Vaginal delivery 11/25/2011  . Perineal laceration complicating delivery 11/25/2011  . Candida albicans infection 11/15/2011  . Excess weight gain in pregnancy 11/07/2011  . Anemia in pregnancy 09/14/2011  . History of high blood pressure - never took meds 04/12/2011  . 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 History of anemia 11/26/2010  . Asthma 11/26/2010  . Migraine 11/26/2010    Past Surgical History:  Procedure Laterality Date  . CHOLECYSTECTOMY  03/20/2012   Procedure: LAPAROSCOPIC CHOLECYSTECTOMY;  Surgeon: Shelly Rubenstein, MD;  Location: [facility] ORS;  Service: General;  Laterality: N/A;  . CHOLECYSTECTOMY, LAPAROSCOPIC N/A 2013    OB History    Gravida Para Term Preterm AB Living   0 0 2   SAB TAB Ectopic Multiple Live Births   0 0 0 0 2       Home  Medications    Prior to Admission medications   Not on File    Family History Family History  Problem Relation Age of Onset  . Hypertension Mother   . Cancer Maternal Grandmother        breast  . Cancer Maternal Grandfather        colon  . Anesthesia problems Neg Hx     Social History Social History  Substance Use Topics  . Smoking status: Never Smoker  . Smokeless tobacco: Never Used  . Alcohol use Yes     Comment: occassionally      Allergies   Penicillins; Iodine; Latex; Shellfish allergy; and Tomato   Review of Systems Review of Systems  All other systems reviewed and are negative.    Physical Exam Updated Vital Signs BP (!) 143/99 (BP Location: Left Arm)   Pulse 85   Temp 98.3 F (36.8 C) (Oral)   Resp 16   Ht  (1.651 m)   Wt 134 kg (295 lb 6.7 oz)   LMP 01/21/2017   BMI 49.16 kg/m   Physical Exam  Constitutional: She is oriented to person, place, and time. She appears well-developed and well-nourished.  HENT:  Head: Normocephalic.  Eyes: EOM are normal.  Neck: Normal range of motion.  Pulmonary/Chest: Effort normal.  Abdominal: She exhibits no distension.  Musculoskeletal: Normal range of motion.  Neurological: She is alert and oriented  to person, place, and time.  Psychiatric: She has a normal mood and affect.  Nursing note and vitals reviewed.    ED Treatments / Results  Labs (all labs ordered are listed, but only abnormal results are displayed) Labs Reviewed  URINALYSIS, ROUTINE W REFLEX MICROSCOPIC  PREGNANCY, URINE    EKG  EKG Interpretation None       Radiology No results found.  Procedures Procedures (including critical care time)  Medications Ordered in ED Medications - No data to display   Initial Impression / Assessment and Plan / ED Course  I have reviewed the triage vital signs and the nursing notes.  Pertinent labs & imaging results that were available during my care of the patient were reviewed by me  and considered in my medical decision making (see chart for details).     Overall well appearing. Prevacid test is negative. Labs are without significant abnormality. Outpatient GI and PCP follow-up. Patient understands return to ER for new or worsening symptoms. Patient be placed empirically on Prilosec. Zofran for nausea.  Final Clinical Impressions(s) / ED Diagnoses   Final diagnoses:  Nausea    New Prescriptions New Prescriptions   No medications on file     Azalia Bilis, MD 02/21/17 2202

## 2017-02-21 NOTE — ED Triage Notes (Signed)
C/o nausea x 2 weeks-NAD-steady gait

## 2017-02-22 ENCOUNTER — Emergency Department (HOSPITAL_COMMUNITY)
Admission: EM | Admit: 2017-02-22 | Discharge: 2017-02-22 | Discharge status: Against Medical Advice | Payer: Self-pay | Attending: Emergency Medicine | Admitting: Emergency Medicine

## 2017-02-22 ENCOUNTER — Encounter (HOSPITAL_COMMUNITY): Payer: Self-pay | Admitting: *Deleted

## 2017-02-22 DIAGNOSIS — Z5320 Procedure and treatment not carried out because of patient's decision for unspecified reasons: Secondary | ICD-10-CM | POA: Insufficient documentation

## 2017-02-22 DIAGNOSIS — K921 Melena: Secondary | ICD-10-CM

## 2017-02-22 DIAGNOSIS — K625 Hemorrhage of anus and rectum: Secondary | ICD-10-CM | POA: Insufficient documentation

## 2017-02-22 DIAGNOSIS — J45909 Unspecified asthma, uncomplicated: Secondary | ICD-10-CM | POA: Insufficient documentation

## 2017-02-22 DIAGNOSIS — Z91040 Latex allergy status: Secondary | ICD-10-CM | POA: Insufficient documentation

## 2017-02-22 NOTE — Discharge Instructions (Signed)
Please followup with GI and primary doctor at first availability. Return to ED for worsening symptoms

## 2017-02-22 NOTE — ED Triage Notes (Signed)
Pt reports having nausea x 3 weeks and having blood in stools and dark stools. Pt was [facility] for same and dc home with zofran.

## 2017-02-22 NOTE — ED Provider Notes (Signed)
[facility]   CSN: [id number] Arrival date & time: 02/22/17  1534     History   Chief Complaint Chief Complaint  Patient presents with  . Emesis  . Blood In Stools    HPI Karen Simon is a 26 y.o. female.  26 year old female history of morbid obesity, regular periods, asthma who presents with rectal bleeding.  Was evaluated for similar symptoms yesterday.  States her symptoms have been ongoing for at least one month.  Endorses melenotic stools, denies new foods or ingesting red substances. She states she has previously undergone colonoscopy 1 year ago given family history of colon cancer; no abnormalities were found.  On further clarification, she states her rectal bleeding has been ongoing for over a year. Has noted bright red blood primarily with wiping. She endorses feeling lightheaded. Decreased PO intake. Endorses 12 lb unintentional weight loss over past month. States she is unable to wait for GI appt in 2 weeks.   The history is provided by the patient and medical records. No language interpreter was used.    Past Medical History:  Diagnosis Date  . Anemia   . Asthma   . Enlarged heart   . H/O candidiasis   . H/O varicella   . Irregular periods/menstrual cycles 07/01/09  . Migraines   . Morbid obesity (HCC) 09/17/2015    Patient Active Problem List   Diagnosis Date Noted  . Morbid obesity (HCC) 09/17/2015  . Normal labor 02/23/2014  . Lactating mother 11/27/2011  . Teen pregnancy 11/27/2011  . Pregnant state, incidental 11/25/2011  . Vaginal delivery 11/25/2011  . Perineal laceration complicating delivery 11/25/2011  . Candida albicans infection 11/15/2011  . Excess weight gain in pregnancy 11/07/2011  . Anemia in pregnancy 09/14/2011  . History of high blood pressure - never took meds 04/12/2011  . 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 History of anemia 11/26/2010  . Asthma  11/26/2010  . Migraine 11/26/2010    Past Surgical History:  Procedure Laterality Date  . CHOLECYSTECTOMY  03/20/2012   Procedure: LAPAROSCOPIC CHOLECYSTECTOMY;  Surgeon: Shelly Rubensteinouglas A Blackman, MD;  Location: [facility] ORS;  Service: General;  Laterality: N/A;  . CHOLECYSTECTOMY, LAPAROSCOPIC N/A 2013    OB History    Gravida Para Term Preterm AB Living   2 2 2  0 0 2   SAB TAB Ectopic Multiple Live Births   0 0 0 0 2       Home Medications    Prior to Admission medications   Medication Sig Start Date End Date Taking? Authorizing Provider  omeprazole (PRILOSEC) 20 MG capsule Take 1 capsule (20 mg total) by mouth daily. 02/21/17   Azalia Bilisampos, Kevin, MD  ondansetron (ZOFRAN ODT) 8 MG disintegrating tablet Take 1 tablet (8 mg total) by mouth every 8 (eight) hours as needed for nausea or vomiting. 02/21/17   Azalia Bilisampos, Kevin, MD    Family History Family History  Problem Relation Age of Onset  . Hypertension Mother   . Cancer Maternal Grandmother        breast  . Cancer Maternal Grandfather        colon  . Anesthesia problems Neg Hx     Social History Social History  Substance Use Topics  . Smoking status: Never Smoker  . Smokeless tobacco: Never Used  . Alcohol use Yes     Comment: occassionally      Allergies   Penicillins; Iodine; Latex; Shellfish allergy;  and Tomato   Review of Systems Review of Systems  Constitutional: Positive for appetite change, fatigue and unexpected weight change. Negative for chills and fever.  HENT: Negative for ear pain and sore throat.   Eyes: Negative for pain and visual disturbance.  Respiratory: Negative for cough and shortness of breath.   Cardiovascular: Negative for chest pain and palpitations.  Gastrointestinal: Positive for blood in stool. Negative for abdominal pain and vomiting.  Genitourinary: Negative for dysuria and hematuria.  Musculoskeletal: Negative for arthralgias and back pain.  Skin: Negative for color change and rash.    Neurological: Negative for seizures and syncope.  All other systems reviewed and are negative.    Physical Exam Updated Vital Signs BP 132/78 (BP Location: Left Arm)   Pulse 71   Temp 98.4 F (36.9 C) (Oral)   Resp 18   LMP 01/21/2017   SpO2 100%   Physical Exam  Constitutional: She appears well-developed and well-nourished. No distress.  Obese female, appears frustrated  HENT:  Head: Normocephalic and atraumatic.  Eyes: Conjunctivae are normal.  Neck: Neck supple.  Cardiovascular: Normal rate and regular rhythm.   No murmur heard. Pulmonary/Chest: Effort normal and breath sounds normal. No respiratory distress.  Abdominal: Soft. There is no tenderness.  Genitourinary:  Genitourinary Comments: Pt declining rectal exam  Musculoskeletal: She exhibits no edema.  Neurological: She is alert. No cranial nerve deficit. Coordination normal.  Moves all extremities  Skin: Skin is warm and dry.  Nursing note and vitals reviewed.    ED Treatments / Results  Labs (all labs ordered are listed, but only abnormal results are displayed) Labs Reviewed - No data to display  EKG  EKG Interpretation None       Radiology No results found.  Procedures Procedures (including critical care time)  Medications Ordered in ED Medications - No data to display   Initial Impression / Assessment and Plan / ED Course  I have reviewed the triage vital signs and the nursing notes.  Pertinent labs & imaging results that were available during my care of the patient were reviewed by me and considered in my medical decision making (see chart for details).     68 yoF h/o obesity who p/w continued nausea and dark stools with occasional BRBPR. Appears well hydrated in no distress. AF, VSS. She is frustrated that there is no clear diagnosis and is unable to await upcoming GI appt. Reviewed workup from yesterday showing no Hgb change over 6 months. Abdomen completely benign  Discussed  repeating labwork and performing rectal exam with potential anoscopy. Patient declines further evaluation due to extensive time in waiting room. She declines additional testing and requests to leave AMA before evaluation by attending.  Pt care d/w Dr. Rush Landmark  Final Clinical Impressions(s) / ED Diagnoses   Final diagnoses:  Blood in stool    New Prescriptions Discharge Medication List as of 02/22/2017  9:21 PM        Hebert Soho, MD 02/23/17 1216    Tegeler, Canary Brim, MD 02/25/17 0005

## 2017-03-11 ENCOUNTER — Encounter (HOSPITAL_BASED_OUTPATIENT_CLINIC_OR_DEPARTMENT_OTHER): Payer: Self-pay | Admitting: *Deleted

## 2017-03-11 ENCOUNTER — Emergency Department (HOSPITAL_BASED_OUTPATIENT_CLINIC_OR_DEPARTMENT_OTHER)
Admission: EM | Admit: 2017-03-11 | Discharge: 2017-03-11 | Disposition: A | Discharge status: Home / Self Care | Payer: Medicaid Other | Attending: Physician Assistant | Admitting: Physician Assistant

## 2017-03-11 DIAGNOSIS — Z79899 Other long term (current) drug therapy: Secondary | ICD-10-CM | POA: Insufficient documentation

## 2017-03-11 DIAGNOSIS — J069 Acute upper respiratory infection, unspecified: Secondary | ICD-10-CM

## 2017-03-11 DIAGNOSIS — J45909 Unspecified asthma, uncomplicated: Secondary | ICD-10-CM | POA: Insufficient documentation

## 2017-03-11 DIAGNOSIS — D649 Anemia, unspecified: Secondary | ICD-10-CM | POA: Insufficient documentation

## 2017-03-11 DIAGNOSIS — Z91040 Latex allergy status: Secondary | ICD-10-CM | POA: Insufficient documentation

## 2017-03-11 MED ORDER — BENZONATATE 100 MG PO CAPS: 100.0000 mg | ORAL_CAPSULE | Freq: Four times a day (QID) | ORAL | 0 refills | Status: DC | PRN

## 2017-03-11 MED ORDER — DEXAMETHASONE SODIUM PHOSPHATE 10 MG/ML IJ SOLN
10.0000 mg | Freq: Once | INTRAMUSCULAR | Status: AC
  Administered 2017-03-11: 10 mg via INTRAMUSCULAR
  Filled 2017-03-11: qty 1

## 2017-03-11 NOTE — Discharge Instructions (Signed)
You were seen in the ER for cough and sore throat which is likely related to a viral infection.  You received a Dexamethasone injection to help with some of the inflammation in your throat and I have prescribed you Tessalon perles for your cough. I anticipate you will turn the corner soon. As we discussed, it is important that you continue to drink plenty of fluids to stay well-hydrated.  Follow up with your PCP if your symptoms worsen or fail to improve.

## 2017-03-11 NOTE — ED Triage Notes (Addendum)
Pt reports cough and cold symptoms x4days. Reports fever of 99.9 yesterday. Presents with laryngitis, sore throat. Denies n/v/d. Reports home treatment of Afrin twice daily x4days.

## 2017-03-11 NOTE — ED Provider Notes (Signed)
[facility] EMERGENCY DEPARTMENT Provider Note   CSN: [id number] Arrival date & time: 03/11/17  0701   History   Chief Complaint Sore throat   HPI Karen Simon is a 26 y.o. female.   Complaining of sore throat which began yesterday morning and progressively worsened throughout the day causing her to lose her voice.   Had a mild temperature yesterday to 99.9, no chills reported.  She endorses congestion x the past 4 days, myalgias, runny nose, and cough which is productive and yellow-ish brown in color with no blood noted.  Hasn't had much of an appetite but has been trying to drink plenty of fluids including water and soda.  Lives at home with her husband and 2 children.  Her daughter is also sick with fever and runny nose.  Has not had her flu shot this year. Denies nausea, vomiting, diarrhea, rash, CP, SOB, palpitations, abdominal pain. No difficulty with swallowing.   Past Medical History:  Diagnosis Date  . Anemia   . Asthma   . Enlarged heart   . H/O candidiasis   . H/O varicella   . Irregular periods/menstrual cycles 07/01/09  . Migraines   . Morbid obesity (HCC) 09/17/2015   Patient Active Problem List   Diagnosis Date Noted  . Morbid obesity (HCC) 09/17/2015  . Normal labor 02/23/2014  . Lactating mother 11/27/2011  . Teen pregnancy 11/27/2011  . Pregnant state, incidental 11/25/2011  . Vaginal delivery 11/25/2011  . Perineal laceration complicating delivery 11/25/2011  . Candida albicans infection 11/15/2011  . Excess weight gain in pregnancy 11/07/2011  . Anemia in pregnancy 09/14/2011  . History of high blood pressure - never took meds 04/12/2011  . 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 History of anemia 11/26/2010  . Asthma 11/26/2010  . Migraine 11/26/2010    Past Surgical History:  Procedure Laterality Date  . CHOLECYSTECTOMY  03/20/2012   Procedure: LAPAROSCOPIC CHOLECYSTECTOMY;  Surgeon: Shelly Rubenstein, MD;   Location: [facility];  Service: General;  Laterality: N/A;  . CHOLECYSTECTOMY, LAPAROSCOPIC N/A 2013    OB History    Gravida Para Term Preterm AB Living   2 2 2  0 0 2   SAB TAB Ectopic Multiple Live Births   0 0 0 0 2      Home Medications    Prior to Admission medications   Medication Sig Start Date End Date Taking? Authorizing Provider  benzonatate (TESSALON PERLES) 100 MG capsule Take 1 capsule (100 mg total) by mouth every 6 (six) hours as needed for cough. 03/11/17 03/11/18  Freddrick March, MD  omeprazole (PRILOSEC) 20 MG capsule Take 1 capsule (20 mg total) by mouth daily. 02/21/17   Azalia Bilis, MD  ondansetron (ZOFRAN ODT) 8 MG disintegrating tablet Take 1 tablet (8 mg total) by mouth every 8 (eight) hours as needed for nausea or vomiting. 02/21/17   Azalia Bilis, MD    Family History Family History  Problem Relation Age of Onset  . Hypertension Mother   . Cancer Maternal Grandmother        breast  . Cancer Maternal Grandfather        colon  . Anesthesia problems Neg Hx     Social History Social History  Substance Use Topics  . Smoking status: Never Smoker  . Smokeless tobacco: Never Used  . Alcohol use Yes     Comment: occassionally      Allergies   Penicillins; Iodine; Latex; Shellfish allergy;  and Tomato   Review of Systems Review of Systems  Constitutional: Negative for chills and fever.  HENT: Positive for rhinorrhea and sore throat. Negative for ear pain and facial swelling.   Respiratory: Positive for cough. Negative for shortness of breath and wheezing.   Cardiovascular: Negative for chest pain, palpitations and leg swelling.  Gastrointestinal: Negative for abdominal pain, constipation, diarrhea, nausea and vomiting.  Genitourinary: Negative for dysuria.   Physical Exam Updated Vital Signs BP 130/71 (BP Location: Left Arm)   Pulse 89   Temp 98.3 F (36.8 C) (Oral)   Resp 16   Ht 5\' 5"  (1.651 m)   Wt 131.5 kg (290 lb)   LMP 02/24/2017 (Exact  Date)   SpO2 99%   BMI 48.26 kg/m   Physical Exam Gen89- 26 yo female, NAD  Skin - warm, dry, no rash  HEENT - NCAT, EOMI, PERRL, MMM, O/P clear, no tonsillar exudate or erythema  Neck - supple, no significant adenopathy Chest - CTAB, no wheeze  Heart - RRR no MRG  Abdomen - soft, NTND, +bs  Musculoskeletal - no edema  Neuro -no focal deficits   ED Treatments / Results  Labs (all labs ordered are listed, but only abnormal results are displayed) Labs Reviewed - No data to display  EKG  EKG Interpretation None      Radiology No results found.  Procedures Procedures (including critical care time)  Medications Ordered in ED Medications  dexamethasone (DECADRON) injection 10 mg (not administered)   Initial Impression / Assessment and Plan / ED Course  I have reviewed the triage vital signs and the nursing notes.  Pertinent labs & imaging results that were available during my care of the patient were reviewed by me and considered in my medical decision making (see chart for details).  26 y/o female with cough and sore throat likely consistent with viral URI .  Otherwise well appearing and physical exam without red flags.  Low likelihood of strep pharyngitis based on Cent[facility] criteria.  Possible she may have influenza however symptoms have been present x4 days and would likely not benefit from Tamiflu past 48 hours.   -Plan for IM dexamethasone injection x 1 in ED  -Recommend Ibuprofen/Tylenol for fever -Rx provided : Tessalon perles for cough  -Discussed frequent handwashing and encouraged continue po fluid intake  -Return precautions discussed   Final Clinical Impressions(s) / ED Diagnoses   Final diagnoses:  Viral upper respiratory tract infection    New Prescriptions New Prescriptions   BENZONATATE (TESSALON PERLES) 100 MG CAPSULE    Take 1 capsule (100 mg total) by mouth every 6 (six) hours as needed for cough.   Freddrick MarchYashika Tacia Hindley, MD [facility], PGY-2     Freddrick MarchAmin,  Y[address], MD 03/11/17 [id number]    Abelino DerrickMackuen, Courteney Lyn, MD 03/11/17 [phone]

## 2017-03-21 ENCOUNTER — Encounter (HOSPITAL_BASED_OUTPATIENT_CLINIC_OR_DEPARTMENT_OTHER): Payer: Self-pay

## 2017-03-21 ENCOUNTER — Emergency Department (HOSPITAL_BASED_OUTPATIENT_CLINIC_OR_DEPARTMENT_OTHER): Payer: Self-pay

## 2017-03-21 ENCOUNTER — Other Ambulatory Visit: Payer: Self-pay

## 2017-03-21 ENCOUNTER — Emergency Department (HOSPITAL_BASED_OUTPATIENT_CLINIC_OR_DEPARTMENT_OTHER)
Admission: EM | Admit: 2017-03-21 | Discharge: 2017-03-21 | Disposition: A | Discharge status: Home / Self Care | Payer: Self-pay | Attending: Physician Assistant | Admitting: Physician Assistant

## 2017-03-21 DIAGNOSIS — B9789 Other viral agents as the cause of diseases classified elsewhere: Secondary | ICD-10-CM | POA: Insufficient documentation

## 2017-03-21 DIAGNOSIS — Z91040 Latex allergy status: Secondary | ICD-10-CM | POA: Insufficient documentation

## 2017-03-21 DIAGNOSIS — J069 Acute upper respiratory infection, unspecified: Secondary | ICD-10-CM | POA: Insufficient documentation

## 2017-03-21 DIAGNOSIS — J45909 Unspecified asthma, uncomplicated: Secondary | ICD-10-CM | POA: Insufficient documentation

## 2017-03-21 IMAGING — CR DG CHEST 2V
2 series · 2 of 2 positions shown · non-contrast
Comparison: Chest x-ray of [DATE]

CLINICAL DATA: Cough and body aches for the past 2 weeks. History
of asthma, never smoked, morbid obesity.

EXAM:
CHEST  2 VIEW

[w chest pa]
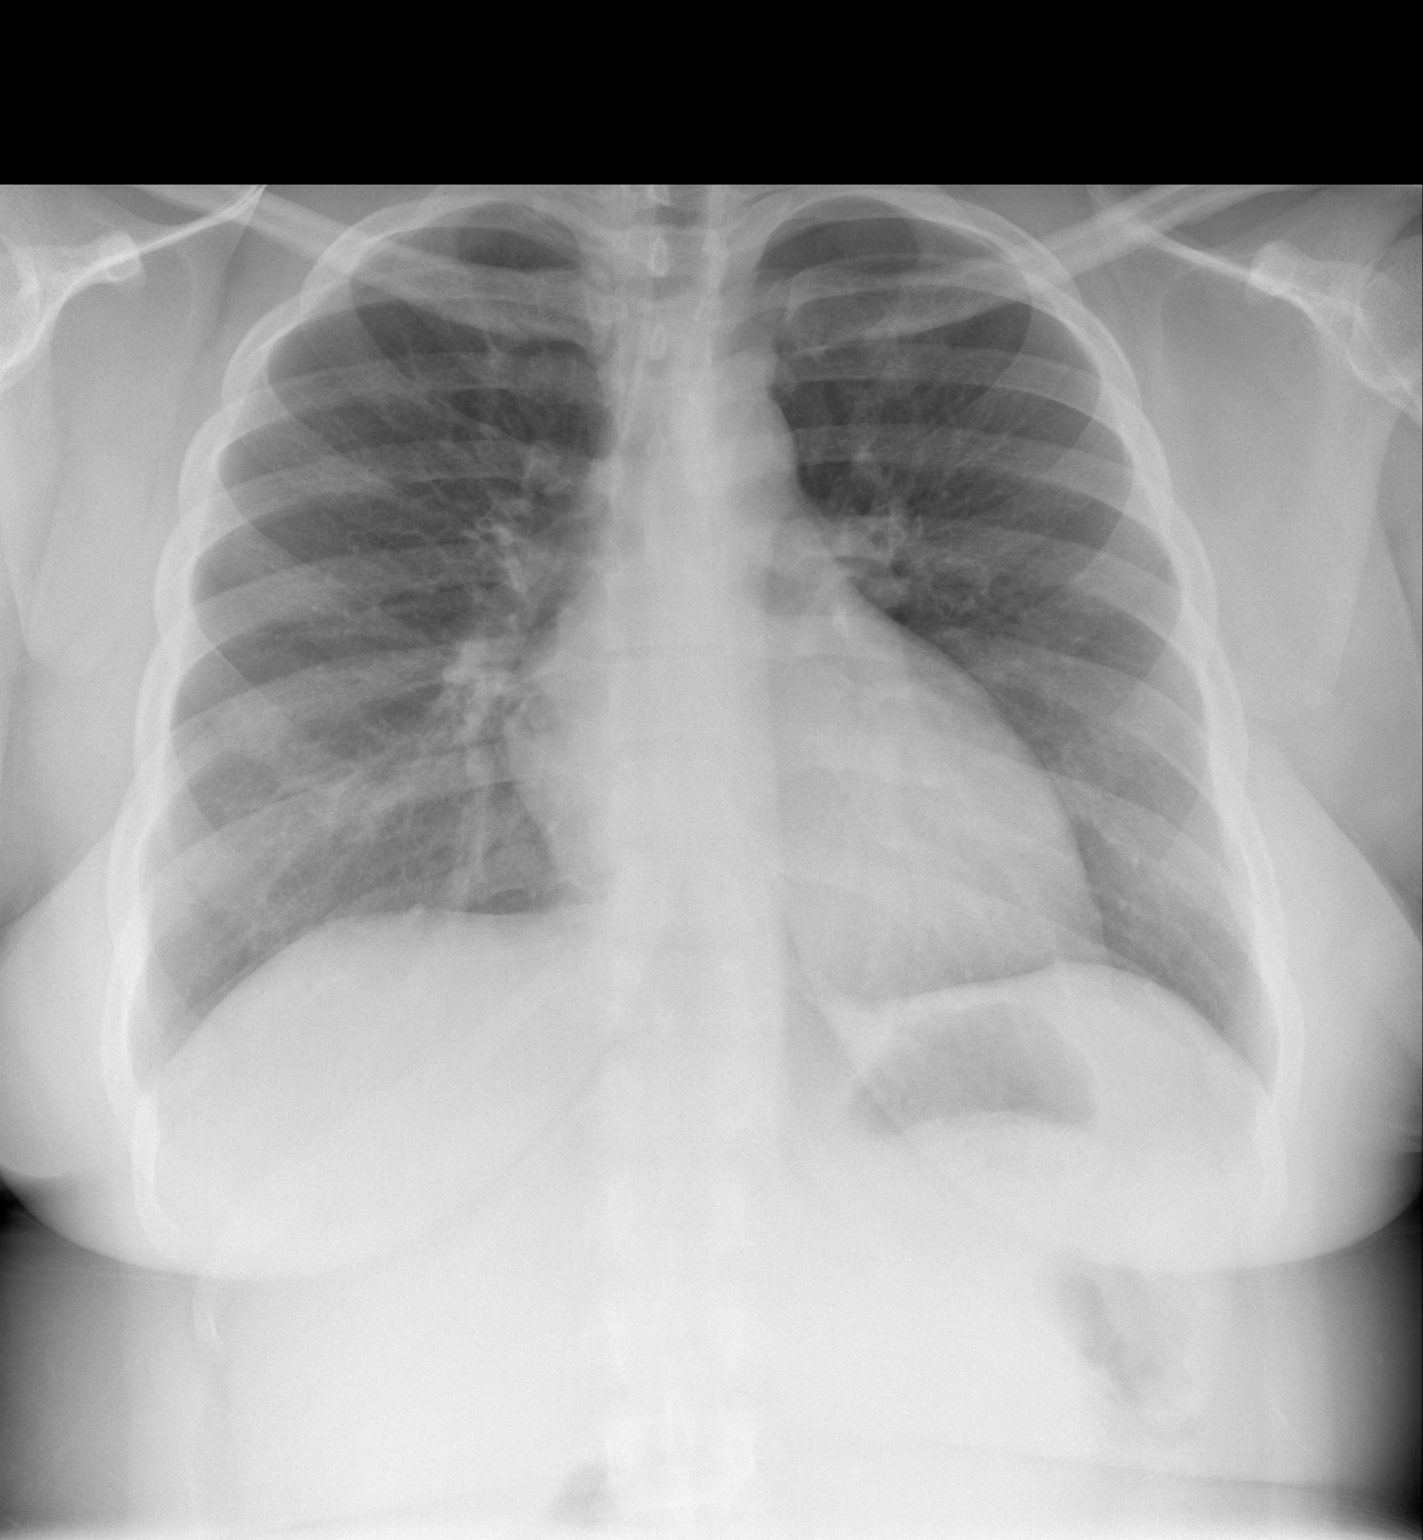

[w chest lat]
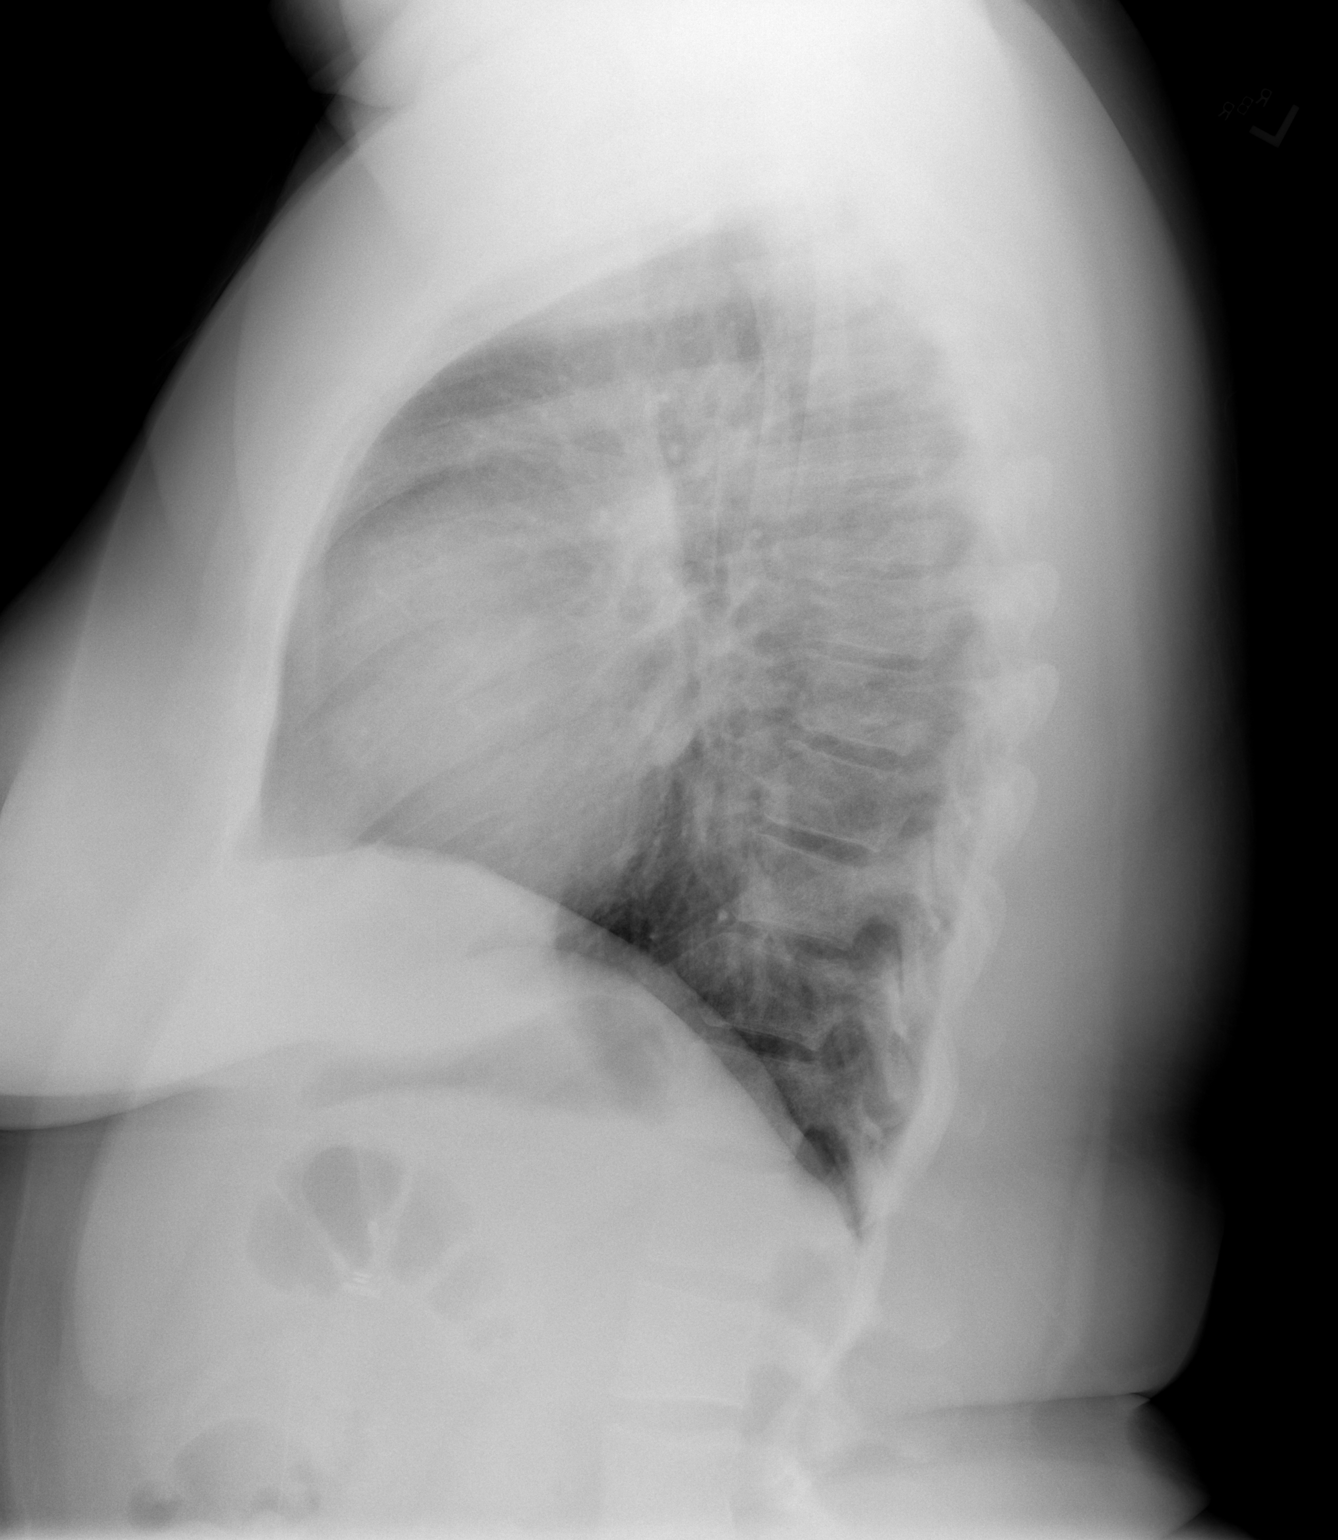

[2 of 2 positions shown; findings below may reference images not displayed]

FINDINGS: The lungs are adequately inflated. There is no focal infiltrate.
There is no pleural effusion. The heart and pulmonary vascularity
are normal. The mediastinum is normal in width. The bony thorax is
unremarkable.
IMPRESSION: There is no acute cardiopulmonary abnormality.

## 2017-03-21 MED ORDER — BENZONATATE 100 MG PO CAPS: 100.0000 mg | ORAL_CAPSULE | Freq: Three times a day (TID) | ORAL | 0 refills | Status: DC | PRN

## 2017-03-21 MED ORDER — GUAIFENESIN-CODEINE 100-10 MG/5ML PO SOLN: 5.0000 mL | Freq: Four times a day (QID) | ORAL | 0 refills | Status: DC | PRN

## 2017-03-21 MED FILL — BENZONATATE 100 MG CAPSULE: 100 | 7 days supply | Qty: 20 | Fill #0

## 2017-03-21 MED FILL — VIRTUSSIN AC LIQUID: 100-10 | 6 days supply | Qty: 120 | Fill #0

## 2017-03-21 NOTE — ED Provider Notes (Signed)
[facility] EMERGENCY DEPARTMENT Provider Note   CSN: [id number] Arrival date & time: 03/21/17  1125     History   Chief Complaint Chief Complaint  Patient presents with  . Cough    HPI Karen Simon is a 26 y.o. female.  HPI   Present 26 year old female presenting with cough and congestion.  Patient reports that she had a similar symptoms, diagnosed with laryngitis last week.  She felt much better and then all of a sudden last couple days she has had increasing cough.  Patient has no sore throat at this time.  Patient is here with her daughter with similar URI symptoms.  Past Medical History:  Diagnosis Date  . Anemia   . Asthma   . Enlarged heart   . H/O candidiasis   . H/O varicella   . Irregular periods/menstrual cycles 07/01/09  . Migraines   . Morbid obesity (HCC) 09/17/2015    Patient Active Problem List   Diagnosis Date Noted  . Morbid obesity (HCC) 09/17/2015  . Normal labor 02/23/2014  . Lactating mother 11/27/2011  . Teen pregnancy 11/27/2011  . Pregnant state, incidental 11/25/2011  . Vaginal delivery 11/25/2011  . Perineal laceration complicating delivery 11/25/2011  . Candida albicans infection 11/15/2011  . Excess weight gain in pregnancy 11/07/2011  . Anemia in pregnancy 09/14/2011  . History of high blood pressure - never took meds 04/12/2011  . 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 History of anemia 11/26/2010  . Asthma 11/26/2010  . Migraine 11/26/2010    Past Surgical History:  Procedure Laterality Date  . CHOLECYSTECTOMY, LAPAROSCOPIC N/A 2013    OB History    Gravida Para Term Preterm AB Living   2 2 2  0 0 2   SAB TAB Ectopic Multiple Live Births   0 0 0 0 2       Home Medications    Prior to Admission medications   Not on File    Family History Family History  Problem Relation Age of Onset  . Hypertension Mother   . Cancer Maternal Grandmother        breast  . Cancer Maternal  Grandfather        colon  . Anesthesia problems Neg Hx     Social History Social History   Tobacco Use  . Smoking status: Never Smoker  . Smokeless tobacco: Never Used  Substance Use Topics  . Alcohol use: Yes    Comment: occassionally   . Drug use: No     Allergies   Penicillins; Iodine; Latex; Shellfish allergy; and Tomato   Review of Systems Review of Systems  Constitutional: Positive for fatigue. Negative for activity change and fever.  HENT: Positive for congestion.   Respiratory: Positive for cough. Negative for shortness of breath.   Cardiovascular: Negative for chest pain.  Gastrointestinal: Negative for abdominal pain.     Physical Exam Updated Vital Signs BP 126/75 (BP Location: Left Arm)   Pulse 87   Temp 98.3 F (36.8 C) (Oral)   Resp 20   Ht 5\' 5"  (1.651 m)   Wt (!) 139 kg (306 lb 7 oz)   LMP 02/24/2017 (Exact Date)   SpO2 100%   BMI 50.99 kg/m   Physical Exam  Constitutional: She is oriented to person, place, and time. She appears well-developed and well-nourished.  HENT:  Head: Normocephalic and atraumatic.  No erythema to posterior pharynx, no cervical adenopathy.  Mild erythema to bilateral turbinates.  Eyes: EOM are normal. Pupils are equal, round, and reactive to light. Right eye exhibits no discharge. Left eye exhibits no discharge.  Cardiovascular: Normal rate and regular rhythm.  Pulmonary/Chest: Effort normal. No stridor. She has no wheezes. She has no rales.  Neurological: She is oriented to person, place, and time.  Skin: Skin is warm and dry. She is not diaphoretic.  Psychiatric: She has a normal mood and affect.  Nursing note and vitals reviewed.    ED Treatments / Results  Labs (all labs ordered are listed, but only abnormal results are displayed) Labs Reviewed - No data to display  EKG  EKG Interpretation None       Radiology No results found.  Procedures Procedures (including critical care time)  Medications  Ordered in ED Medications - No data to display   Initial Impression / Assessment and Plan / ED Course  I have reviewed the triage vital signs and the nursing notes.  Pertinent labs & imaging results that were available during my care of the patient were reviewed by me and considered in my medical decision making (see chart for details).      Present 26 year old female presenting with cough and congestion.  Patient reports that she had a similar symptoms, diagnosed with laryngitis last week.  She felt much better and then all of a sudden last couple days she has had increasing cough.  Patient has no sore throat at this time.  Patient is here with her daughter with similar URI symptoms.  We will get xray otherwise we will treat symptomatically cleared.  Final Clinical Impressions(s) / ED Diagnoses   Final diagnoses:  None    ED Discharge Orders    None       Abelino DerrickMackuen, Courteney Lyn, MD 03/21/17 1541

## 2017-03-21 NOTE — ED Triage Notes (Signed)
C/o flu like sx x 3 weeks-NAD-steady gait 

## 2017-05-07 ENCOUNTER — Emergency Department (HOSPITAL_BASED_OUTPATIENT_CLINIC_OR_DEPARTMENT_OTHER)
Admission: EM | Admit: 2017-05-07 | Discharge: 2017-05-07 | Disposition: A | Discharge status: Home / Self Care | Payer: Medicaid Other | Attending: Emergency Medicine | Admitting: Emergency Medicine

## 2017-05-07 ENCOUNTER — Encounter (HOSPITAL_BASED_OUTPATIENT_CLINIC_OR_DEPARTMENT_OTHER): Payer: Self-pay | Admitting: *Deleted

## 2017-05-07 ENCOUNTER — Other Ambulatory Visit: Payer: Self-pay

## 2017-05-07 DIAGNOSIS — R112 Nausea with vomiting, unspecified: Secondary | ICD-10-CM | POA: Insufficient documentation

## 2017-05-07 DIAGNOSIS — Z91040 Latex allergy status: Secondary | ICD-10-CM | POA: Insufficient documentation

## 2017-05-07 DIAGNOSIS — J45909 Unspecified asthma, uncomplicated: Secondary | ICD-10-CM | POA: Insufficient documentation

## 2017-05-07 LAB — URINALYSIS, ROUTINE W REFLEX MICROSCOPIC
Bilirubin Urine: NEGATIVE
GLUCOSE, UA: NEGATIVE mg/dL
HGB URINE DIPSTICK: NEGATIVE
Ketones, ur: NEGATIVE mg/dL
Leukocytes, UA: NEGATIVE
Nitrite: NEGATIVE
Protein, ur: NEGATIVE mg/dL
Specific Gravity, Urine: 1.025 (ref 1.005–1.030)
pH: 6 (ref 5.0–8.0)

## 2017-05-07 LAB — PREGNANCY, URINE: Preg Test, Ur: NEGATIVE

## 2017-05-07 MED ORDER — FAMOTIDINE 20 MG PO TABS: 20.0000 mg | ORAL_TABLET | Freq: Two times a day (BID) | ORAL | 0 refills | Status: DC

## 2017-05-07 MED ORDER — ONDANSETRON 8 MG PO TBDP
8.0000 mg | ORAL_TABLET | Freq: Once | ORAL | Status: AC
  Administered 2017-05-07: 8 mg via ORAL
  Filled 2017-05-07: qty 1

## 2017-05-07 MED ORDER — ONDANSETRON 8 MG PO TBDP: ORAL_TABLET | ORAL | 0 refills | Status: DC

## 2017-05-07 MED ORDER — GI COCKTAIL ~~LOC~~
30.0000 mL | Freq: Once | ORAL | Status: AC
  Administered 2017-05-07: 30 mL via ORAL
  Filled 2017-05-07: qty 30

## 2017-05-07 NOTE — ED Notes (Signed)
Alert, NAD, calm, interactive, resps e/u, speaking in clear complete sentences, no dyspnea noted, skin W&D, c/o NV, some R mid back pain x1 week, "thought back pain was related to new mattress", and transient abd pain, (denies: fever, sob, cough congestion, cold sx, dizziness, numbness, tingling, weakness, loss of control of bowel or bladder, saddle paresthesia, bleeding or visual changes).

## 2017-05-07 NOTE — ED Triage Notes (Signed)
Pt c/o vomiting after MN times one.  C/o nausea. Denies any diarrhea. Denies fevers. C/o abd pain only with the one episode of vomiting. C/o urinary frequency. Denies burning.

## 2017-05-07 NOTE — ED Notes (Signed)
EDP at BS 

## 2017-05-07 NOTE — ED Provider Notes (Signed)
[facility] EMERGENCY DEPARTMENT Provider Note   CSN: [id number] Arrival date & time: 05/07/17  0106     History   Chief Complaint Chief Complaint  Patient presents with  . Emesis    HPI Karen Simon is a 26 y.o. female.  The history is provided by the patient.  Emesis   This is a new problem. The current episode started less than 1 hour ago. The problem occurs 2 to 4 times per day. The problem has not changed since onset.The emesis has an appearance of stomach contents. There has been no fever. Pertinent negatives include no abdominal pain, no arthralgias, no chills, no cough, no fever, no headaches, no myalgias, no sweats and no URI. Risk factors include ill contacts.    Past Medical History:  Diagnosis Date  . Anemia   . Asthma   . Enlarged heart   . H/O candidiasis   . H/O varicella   . Irregular periods/menstrual cycles 07/01/09  . Migraines   . Morbid obesity (HCC) 09/17/2015    Patient Active Problem List   Diagnosis Date Noted  . Morbid obesity (HCC) 09/17/2015  . Normal labor 02/23/2014  . Lactating mother 11/27/2011  . Teen pregnancy 11/27/2011  . Pregnant state, incidental 11/25/2011  . Vaginal delivery 11/25/2011  . Perineal laceration complicating delivery 11/25/2011  . Candida albicans infection 11/15/2011  . Excess weight gain in pregnancy 11/07/2011  . Anemia in pregnancy 09/14/2011  . History of high blood pressure - never took meds 04/12/2011  . 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 History of anemia 11/26/2010  . Asthma 11/26/2010  . Migraine 11/26/2010    Past Surgical History:  Procedure Laterality Date  . CHOLECYSTECTOMY  03/20/2012   Procedure: LAPAROSCOPIC CHOLECYSTECTOMY;  Surgeon: Shelly Rubensteinouglas A Blackman, MD;  Location: [facility];  Service: General;  Laterality: N/A;  . CHOLECYSTECTOMY, LAPAROSCOPIC N/A 2013    OB History    Gravida Para Term Preterm AB Living   2 2 2  0 0 2   SAB TAB Ectopic  Multiple Live Births   0 0 0 0 2       Home Medications    Prior to Admission medications   Medication Sig Start Date End Date Taking? Authorizing Provider  benzonatate (TESSALON PERLES) 100 MG capsule Take 1 capsule (100 mg total) 3 (three) times daily as needed by mouth for cough. 03/21/17   Mackuen, Courteney Lyn, MD  guaiFENesin-codeine 100-10 MG/5ML syrup Take 5 mLs every 6 (six) hours as needed by mouth for cough. 03/21/17   Mackuen, Cindee Saltourteney Lyn, MD    Family History Family History  Problem Relation Age of Onset  . Hypertension Mother   . Cancer Maternal Grandmother        breast  . Cancer Maternal Grandfather        colon  . Anesthesia problems Neg Hx     Social History Social History   Tobacco Use  . Smoking status: Never Smoker  . Smokeless tobacco: Never Used  Substance Use Topics  . Alcohol use: Yes    Comment: occassionally   . Drug use: No     Allergies   Penicillins; Iodine; Latex; Shellfish allergy; and Tomato   Review of Systems Review of Systems  Constitutional: Negative for chills and fever.  Respiratory: Negative for cough.   Cardiovascular: Negative for chest pain and leg swelling.  Gastrointestinal: Positive for vomiting. Negative for abdominal pain.  Musculoskeletal: Negative for arthralgias and myalgias.  Neurological: Negative for headaches.  All other systems reviewed and are negative.    Physical Exam Updated Vital Signs BP (!) 144/83 (BP Location: Left Arm)   Pulse 78   Temp 98.3 F (36.8 C) (Oral)   Resp 18   LMP 04/20/2017 (Approximate)   SpO2 100%   Physical Exam  Constitutional: She is oriented to person, place, and time. She appears well-developed and well-nourished. No distress.  HENT:  Head: Normocephalic and atraumatic.  Mouth/Throat: No oropharyngeal exudate.  Eyes: Conjunctivae are normal. Pupils are equal, round, and reactive to light.  Neck: Normal range of motion. Neck supple.  Cardiovascular: Normal  rate, regular rhythm, normal heart sounds and intact distal pulses.  Pulmonary/Chest: Effort normal and breath sounds normal. No stridor. She has no wheezes. She has no rales.  Abdominal: Soft. Bowel sounds are normal. She exhibits no mass. There is no tenderness. There is no rebound and no guarding.  Musculoskeletal: Normal range of motion.  Neurological: She is alert and oriented to person, place, and time.  Skin: Skin is warm and dry. Capillary refill takes less than 2 seconds.  Psychiatric: She has a normal mood and affect.     ED Treatments / Results  Labs (all labs ordered are listed, but only abnormal results are displayed) Results for orders placed or performed during the hospital encounter of 05/07/17  Pregnancy, urine  Result Value Ref Range   Preg Test, Ur NEGATIVE NEGATIVE  Urinalysis, Routine w reflex microscopic  Result Value Ref Range   Color, Urine YELLOW YELLOW   APPearance CLEAR CLEAR   Specific Gravity, Urine 1.025 1.005 - 1.030   pH 6.0 5.0 - 8.0   Glucose, UA NEGATIVE NEGATIVE mg/dL   Hgb urine dipstick NEGATIVE NEGATIVE   Bilirubin Urine NEGATIVE NEGATIVE   Ketones, ur NEGATIVE NEGATIVE mg/dL   Protein, ur NEGATIVE NEGATIVE mg/dL   Nitrite NEGATIVE NEGATIVE   Leukocytes, UA NEGATIVE NEGATIVE   No results found.  Procedures Procedures (including critical care time)  Medications Ordered in ED Medications  gi cocktail (Maalox,Lidocaine,Donnatal) (not administered)  ondansetron (ZOFRAN-ODT) disintegrating tablet 8 mg (8 mg Oral Given 05/07/17 0207)     Final Clinical Impressions(s) / ED Diagnoses    Return for fevers > 100.4 unrelieved by medication, weakness or numbness, stiff neck, intractable vomiting, or diarrhea, abdominal pain, Inability to tolerate liquids or food, cough, altered mental status or any concerns. No signs of systemic illness or infection. The patient is nontoxic-appearing on exam and vital signs are within normal limits.    I  have reviewed the triage vital signs and the nursing notes. Pertinent labs &imaging results that were available during my care of the patient were reviewed by me and considered in my medical decision making (see chart for details).  After history, exam, and medical workup I feel the patient has been appropriately medically screened and is safe for discharge home. Pertinent diagnoses were discussed with the patient. Patient was given return precautions      Moody Robben, MD 05/07/17 [phone]

## 2017-05-11 ENCOUNTER — Emergency Department (HOSPITAL_BASED_OUTPATIENT_CLINIC_OR_DEPARTMENT_OTHER)
Admission: EM | Admit: 2017-05-11 | Discharge: 2017-05-11 | Disposition: A | Discharge status: Home / Self Care | Payer: Medicaid Other | Attending: Emergency Medicine | Admitting: Emergency Medicine

## 2017-05-11 ENCOUNTER — Emergency Department (HOSPITAL_BASED_OUTPATIENT_CLINIC_OR_DEPARTMENT_OTHER): Payer: Medicaid Other

## 2017-05-11 ENCOUNTER — Other Ambulatory Visit: Payer: Self-pay

## 2017-05-11 ENCOUNTER — Encounter (HOSPITAL_BASED_OUTPATIENT_CLINIC_OR_DEPARTMENT_OTHER): Payer: Self-pay | Admitting: Emergency Medicine

## 2017-05-11 DIAGNOSIS — J45909 Unspecified asthma, uncomplicated: Secondary | ICD-10-CM | POA: Insufficient documentation

## 2017-05-11 DIAGNOSIS — R110 Nausea: Secondary | ICD-10-CM | POA: Insufficient documentation

## 2017-05-11 DIAGNOSIS — Z91040 Latex allergy status: Secondary | ICD-10-CM | POA: Insufficient documentation

## 2017-05-11 DIAGNOSIS — R079 Chest pain, unspecified: Secondary | ICD-10-CM

## 2017-05-11 DIAGNOSIS — R0789 Other chest pain: Secondary | ICD-10-CM | POA: Insufficient documentation

## 2017-05-11 LAB — CBC WITH DIFFERENTIAL/PLATELET
BASOS ABS: 0 10*3/uL (ref 0.0–0.1)
Basophils Relative: 0 %
EOS ABS: 0.2 10*3/uL (ref 0.0–0.7)
EOS PCT: 2 %
HCT: 33 % — ABNORMAL LOW (ref 36.0–46.0)
Hemoglobin: 10.5 g/dL — ABNORMAL LOW (ref 12.0–15.0)
LYMPHS PCT: 33 %
Lymphs Abs: 2.8 10*3/uL (ref 0.7–4.0)
MCH: 25.5 pg — ABNORMAL LOW (ref 26.0–34.0)
MCHC: 31.8 g/dL (ref 30.0–36.0)
MCV: 80.3 fL (ref 78.0–100.0)
Monocytes Absolute: 0.4 10*3/uL (ref 0.1–1.0)
Monocytes Relative: 5 %
Neutro Abs: 5 10*3/uL (ref 1.7–7.7)
Neutrophils Relative %: 60 %
PLATELETS: 274 10*3/uL (ref 150–400)
RBC: 4.11 MIL/uL (ref 3.87–5.11)
RDW: 14.4 % (ref 11.5–15.5)
WBC: 8.4 10*3/uL (ref 4.0–10.5)

## 2017-05-11 LAB — TROPONIN I

## 2017-05-11 LAB — COMPREHENSIVE METABOLIC PANEL
ALT: 14 U/L (ref 14–54)
AST: 15 U/L (ref 15–41)
Albumin: 3.3 g/dL — ABNORMAL LOW (ref 3.5–5.0)
Alkaline Phosphatase: 68 U/L (ref 38–126)
Anion gap: 7 (ref 5–15)
BUN: 12 mg/dL (ref 6–20)
CHLORIDE: 103 mmol/L (ref 101–111)
CO2: 25 mmol/L (ref 22–32)
Calcium: 8.7 mg/dL — ABNORMAL LOW (ref 8.9–10.3)
Creatinine, Ser: 0.56 mg/dL (ref 0.44–1.00)
Glucose, Bld: 111 mg/dL — ABNORMAL HIGH (ref 65–99)
POTASSIUM: 3.7 mmol/L (ref 3.5–5.1)
SODIUM: 135 mmol/L (ref 135–145)
Total Bilirubin: 0.4 mg/dL (ref 0.3–1.2)
Total Protein: 7 g/dL (ref 6.5–8.1)

## 2017-05-11 LAB — LIPASE, BLOOD: LIPASE: 27 U/L (ref 11–51)

## 2017-05-11 IMAGING — DX DG CHEST 2V
2 series · 2 of 2 positions shown · non-contrast
Comparison: [DATE]

CLINICAL DATA: Chest pain.

EXAM:
CHEST  2 VIEW

[chest pa]
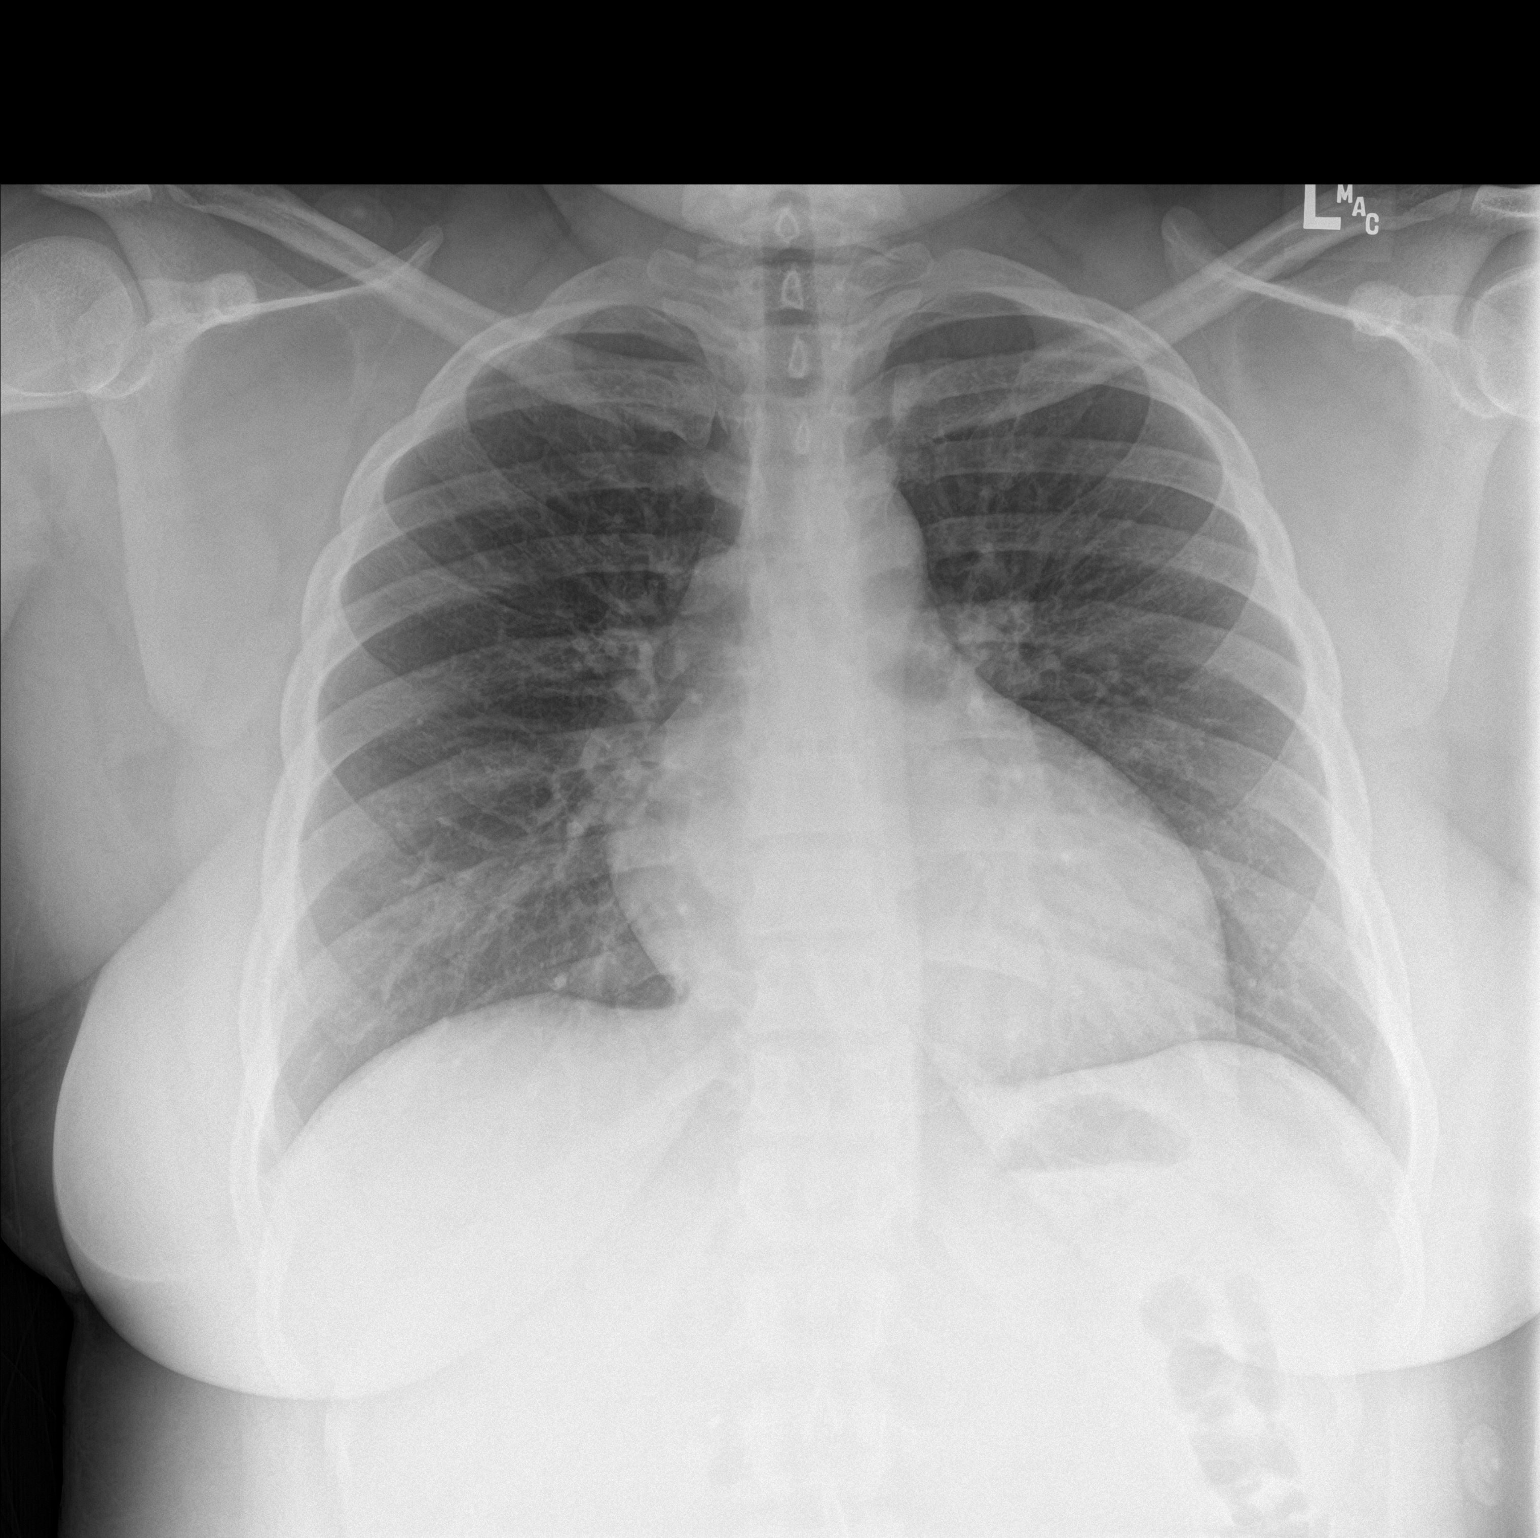

[chest lat]
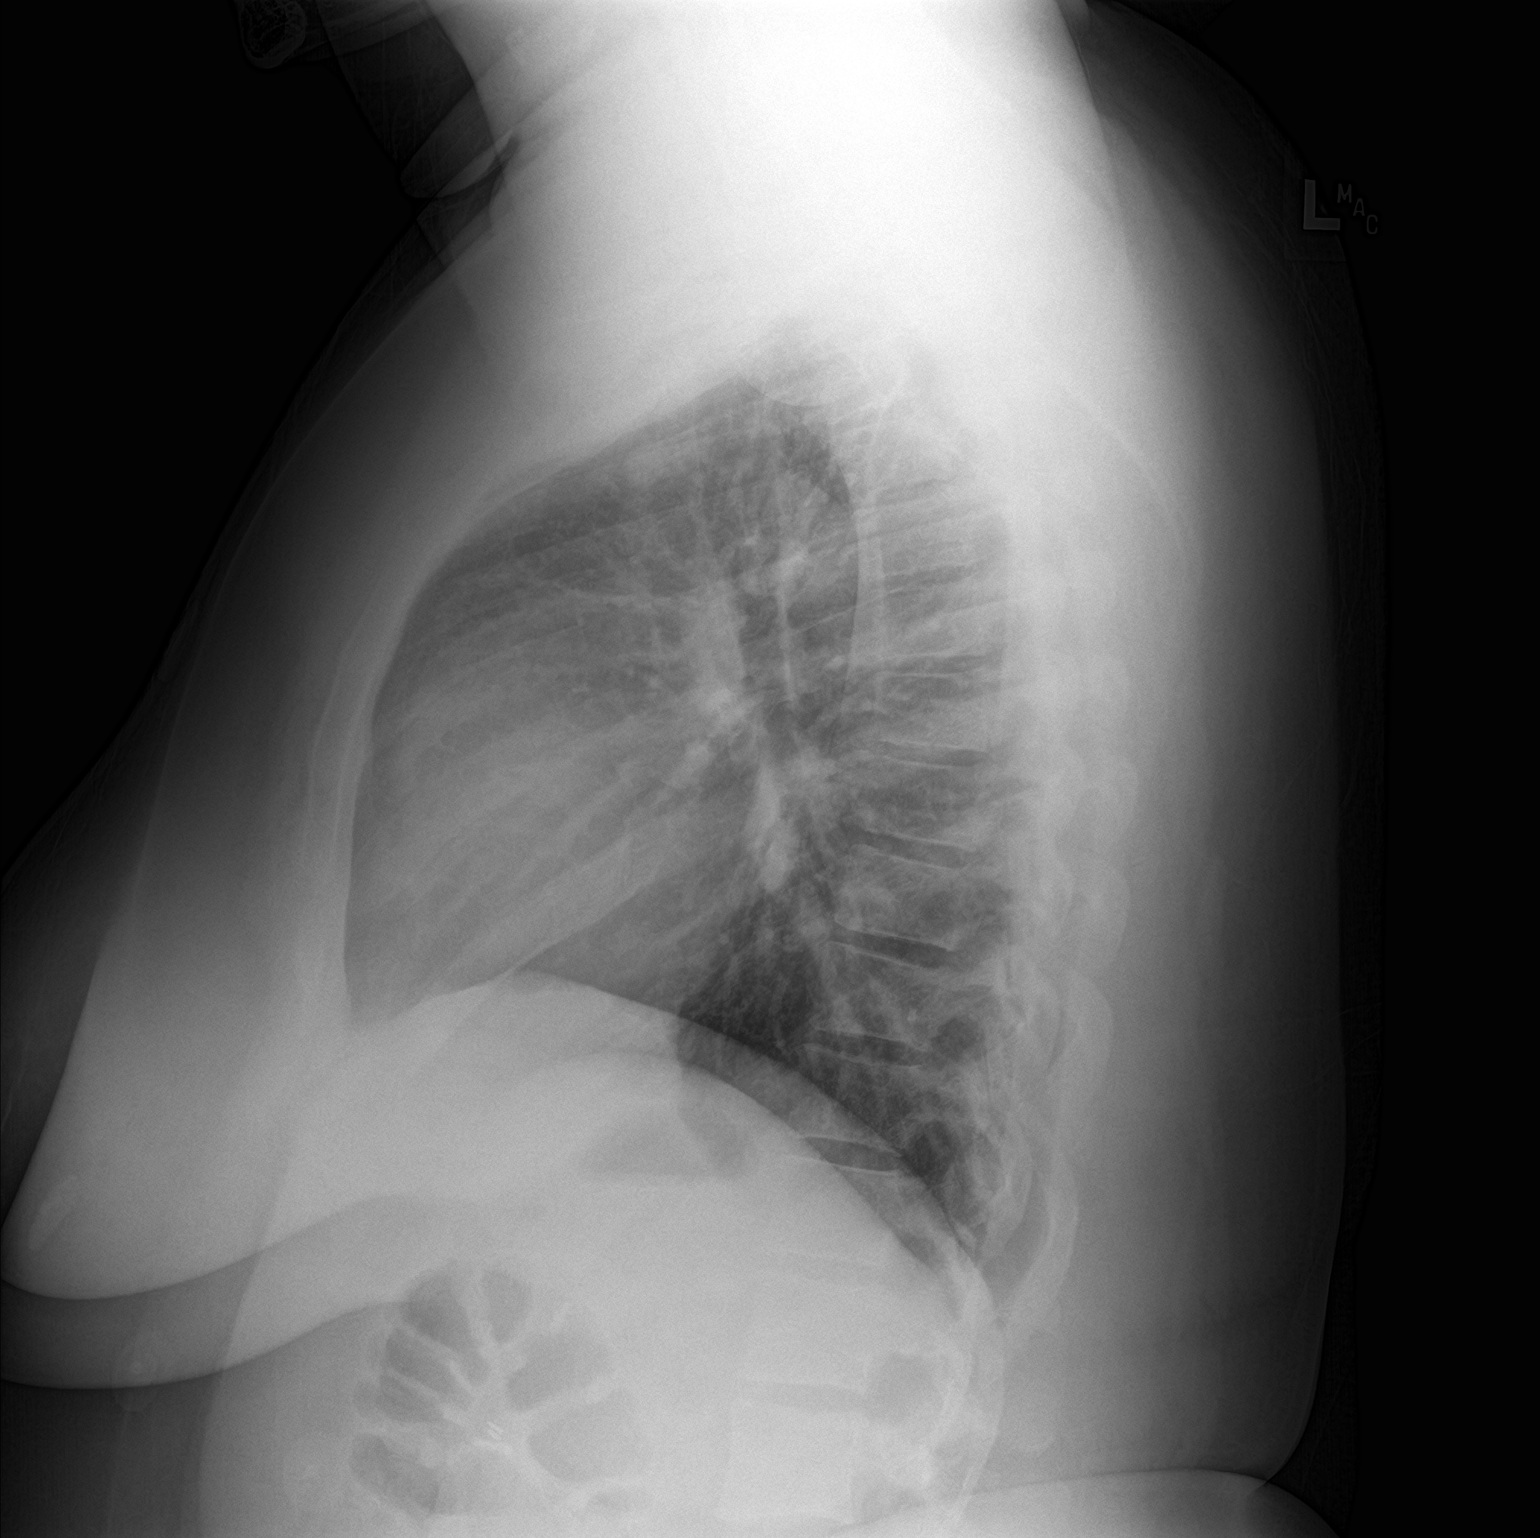

[2 of 2 positions shown; findings below may reference images not displayed]

FINDINGS: The cardiomediastinal contours are normal. Borderline cardiomegaly,
stable. Mild central bronchial thickening. Pulmonary vasculature is
normal. No consolidation, pleural effusion, or pneumothorax. No
acute osseous abnormalities are seen.
IMPRESSION: Mild central bronchial thickening can be seen with bronchitis or
asthma.

## 2017-05-11 MED ORDER — SUCRALFATE 1 GM/10ML PO SUSP
1.0000 g | Freq: Once | ORAL | Status: AC
  Administered 2017-05-11: 1 g via ORAL
  Filled 2017-05-11: qty 10

## 2017-05-11 MED ORDER — ONDANSETRON 8 MG PO TBDP
8.0000 mg | ORAL_TABLET | Freq: Once | ORAL | Status: AC
  Administered 2017-05-11: 8 mg via ORAL
  Filled 2017-05-11: qty 1

## 2017-05-11 NOTE — ED Notes (Signed)
Pt verbalizes understanding of d/c instructions and denies any further needs at this time. 

## 2017-05-11 NOTE — ED Notes (Signed)
Pt c/o central, epigastric pain that started at midnight that was unrelieved with two TUMS.  Pt did not get her pepcid filled from her visit three days ago, for unknown reasons.  It also seems that pt did not follow up with GI like she said she was going to a few months ago.  [facility] had [facility].  Pt was advised that if she has been having abdominal pain for a year, she may want to see a GI doctor elsewhere.  Pain is not reproducible, does not cause SOB, no radiation, and does not seem to cause patient distress as she appears to be sitting on the bed comfortably.

## 2017-05-11 NOTE — ED Notes (Signed)
Patient transported to X-ray 

## 2017-05-11 NOTE — ED Triage Notes (Signed)
Chest pressure started at midnight. No radiation. Nontender. Also abdominal pain that she has had "all of 2018".

## 2017-05-11 NOTE — ED Provider Notes (Addendum)
[facility] Provider Note: Lowella Dell, MD, FACEP  CSN: [id number] MRN: [id number] ARRIVAL: 05/11/17 at 0140 ROOM: MH10/[facility]10   CHIEF COMPLAINT  Chest Pain   HISTORY OF PRESENT ILLNESS  05/11/17 3:12 AM Karen Simon is a 27 y.o. female who has had a long-standing problem with nausea and epigastric discomfort.  She describes this as a "queasy" feeling in her stomach.  She has been seen for this in the past and was prescribed Zofran and Pepcid which she has not gotten filled.  She is here this morning because she developed chest pain about midnight.  She describes the pain as a pressure in the precordium.  She rates it as a 9 out of 10 initially and a 6 out of 10 now.  It does not radiate.  Nothing makes the pain better or worse including Tums.  There is no associated shortness of breath, wheezing, cough or diaphoresis.  She is still having the queasy feeling in her stomach.   Past Medical History:  Diagnosis Date  . Anemia   . Asthma   . Enlarged heart   . H/O candidiasis   . H/O varicella   . Irregular periods/menstrual cycles 07/01/09  . Migraines   . Morbid obesity (HCC) 09/17/2015    Past Surgical History:  Procedure Laterality Date  . CHOLECYSTECTOMY  03/20/2012   Procedure: LAPAROSCOPIC CHOLECYSTECTOMY;  Surgeon: Shelly Rubenstein, MD;  Location: [facility];  Service: General;  Laterality: N/A;  . CHOLECYSTECTOMY, LAPAROSCOPIC N/A 2013    Family History  Problem Relation Age of Onset  . Hypertension Mother   . Cancer Maternal Grandmother        breast  . Cancer Maternal Grandfather        colon  . Anesthesia problems Neg Hx     Social History   Tobacco Use  . Smoking status: Never Smoker  . Smokeless tobacco: Never Used  Substance Use Topics  . Alcohol use: Yes    Comment: occassionally   . Drug use: No    Prior to Admission medications   Medication Sig Start Date End Date Taking? Authorizing Provider  benzonatate (TESSALON PERLES) 100  MG capsule Take 1 capsule (100 mg total) 3 (three) times daily as needed by mouth for cough. 03/21/17   Mackuen, Courteney Lyn, MD  famotidine (PEPCID) 20 MG tablet Take 1 tablet (20 mg total) by mouth 2 (two) times daily. 05/07/17   Palumbo, April, MD  guaiFENesin-codeine 100-10 MG/5ML syrup Take 5 mLs every 6 (six) hours as needed by mouth for cough. 03/21/17   Mackuen, Courteney Lyn, MD  ondansetron (ZOFRAN ODT) 8 MG disintegrating tablet 8mg  ODT q8 hours prn nausea 05/07/17   Palumbo, April, MD    Allergies Penicillins; Iodine; Latex; Shellfish allergy; and Tomato   REVIEW OF SYSTEMS  Negative except as noted here or in the History of Present Illness.   PHYSICAL EXAMINATION  Initial Vital Signs Blood pressure 134/76, pulse 74, temperature 98.5 F (36.9 C), temperature source Oral, resp. rate 16, height 5\' 5"  (1.651 m), weight 131.5 kg (290 lb), last menstrual period 04/20/2017, SpO2 100 %.  Examination General: Well-developed, well-nourished female in no acute distress; appearance consistent with age of record HENT: normocephalic; atraumatic Eyes: pupils equal, round and reactive to light; extraocular muscles intact Neck: supple Heart: regular rate and rhythm; no murmur Lungs: clear to auscultation bilaterally Chest: Nontender Abdomen: soft; nondistended; nontender; bowel sounds present Extremities: No deformity; full range of motion; pulses normal  Neurologic: Awake, alert and oriented; motor function intact in all extremities and symmetric; no facial droop Skin: Warm and dry Psychiatric: Normal mood and affect   RESULTS  Summary of this visit's results, reviewed by myself:   EKG Interpretation  Date/Time:  Thursday May 11 2017 02:00:33 EST Ventricular Rate:  71 PR Interval:    QRS Duration: 91 QT Interval:  393 QTC Calculation: 428 R Axis:   76 Text Interpretation:  Sinus rhythm Baseline wander in lead(s) V1 No significant change was found Confirmed by Paula Libra ([id number]) on 05/11/2017 2:10:03 AM      Laboratory Studies: Results for orders placed or performed during the hospital encounter of 05/11/17 (from the past 24 hour(s))  CBC with Differential     Status: Abnormal   Collection Time: 05/11/17  2:29 AM  Result Value Ref Range   WBC 8.4 4.0 - 10.5 K/uL   RBC 4.11 3.87 - 5.11 MIL/uL   Hemoglobin 10.5 (L) 12.0 - 15.0 g/dL   HCT 60.4 (L) 54.0 - 98.1 %   MCV 80.3 78.0 - 100.0 fL   MCH 25.5 (L) 26.0 - 34.0 pg   MCHC 31.8 30.0 - 36.0 g/dL   RDW 19.1 47.8 - 29.5 %   Platelets 274 150 - 400 K/uL   Neutrophils Relative % 60 %   Neutro Abs 5.0 1.7 - 7.7 K/uL   Lymphocytes Relative 33 %   Lymphs Abs 2.8 0.7 - 4.0 K/uL   Monocytes Relative 5 %   Monocytes Absolute 0.4 0.1 - 1.0 K/uL   Eosinophils Relative 2 %   Eosinophils Absolute 0.2 0.0 - 0.7 K/uL   Basophils Relative 0 %   Basophils Absolute 0.0 0.0 - 0.1 K/uL  Comprehensive metabolic panel     Status: Abnormal   Collection Time: 05/11/17  2:29 AM  Result Value Ref Range   Sodium 135 135 - 145 mmol/L   Potassium 3.7 3.5 - 5.1 mmol/L   Chloride 103 101 - 111 mmol/L   CO2 25 22 - 32 mmol/L   Glucose, Bld 111 (H) 65 - 99 mg/dL   BUN 12 6 - 20 mg/dL   Creatinine, Ser 6.21 0.44 - 1.00 mg/dL   Calcium 8.7 (L) 8.9 - 10.3 mg/dL   Total Protein 7.0 6.5 - 8.1 g/dL   Albumin 3.3 (L) 3.5 - 5.0 g/dL   AST 15 15 - 41 U/L   ALT 14 14 - 54 U/L   Alkaline Phosphatase 68 38 - 126 U/L   Total Bilirubin 0.4 0.3 - 1.2 mg/dL   GFR calc non Af Amer >60 >60 mL/min   GFR calc Af Amer >60 >60 mL/min   Anion gap 7 5 - 15  Lipase, blood     Status: None   Collection Time: 05/11/17  2:29 AM  Result Value Ref Range   Lipase 27 11 - 51 U/L  Troponin I     Status: None   Collection Time: 05/11/17  2:29 AM  Result Value Ref Range   Troponin I <0.03 <0.03 ng/mL   Imaging Studies: Dg Chest 2 View  Result Date: 05/11/2017 CLINICAL DATA:  Chest pain. EXAM: CHEST  2 VIEW COMPARISON:  03/21/2017 FINDINGS:  The cardiomediastinal contours are normal. Borderline cardiomegaly, stable. Mild central bronchial thickening. Pulmonary vasculature is normal. No consolidation, pleural effusion, or pneumothorax. No acute osseous abnormalities are seen. IMPRESSION: Mild central bronchial thickening can be seen with bronchitis or asthma. Electronically Signed   By: Shawna Orleans  Ehinger M.D.   On: 05/11/2017 02:24    ED COURSE  Nursing notes and initial vitals signs, including pulse oximetry, reviewed.  Vitals:   05/11/17 0146 05/11/17 0147  BP: 134/76   Pulse: 74   Resp: 16   Temp: 98.5 F (36.9 C)   TempSrc: Oral   SpO2: 100%   Weight:  131.5 kg (290 lb)  Height:  5\' 5"  (1.651 m)   3:35 AM Patient advised of reassuring diagnostic studies.  She was encouraged to have her prescriptions filled.  PROCEDURES    ED DIAGNOSES     ICD-10-CM   1. Nonspecific chest pain R07.9   2. Nausea R11.0        Smera Guyette, Jonny RuizJohn, MD 05/11/17 [id number]    Paula LibraMolpus, Manny Vitolo, MD 05/11/17 [phone]

## 2017-05-11 NOTE — ED Notes (Signed)
Pt returned from xray

## 2017-09-10 ENCOUNTER — Emergency Department (HOSPITAL_BASED_OUTPATIENT_CLINIC_OR_DEPARTMENT_OTHER)
Admission: EM | Admit: 2017-09-10 | Discharge: 2017-09-10 | Disposition: A | Discharge status: Home / Self Care | Payer: Self-pay | Attending: Emergency Medicine | Admitting: Emergency Medicine

## 2017-09-10 ENCOUNTER — Encounter (HOSPITAL_BASED_OUTPATIENT_CLINIC_OR_DEPARTMENT_OTHER): Payer: Self-pay | Admitting: Emergency Medicine

## 2017-09-10 ENCOUNTER — Other Ambulatory Visit: Payer: Self-pay

## 2017-09-10 DIAGNOSIS — R350 Frequency of micturition: Secondary | ICD-10-CM | POA: Insufficient documentation

## 2017-09-10 DIAGNOSIS — M545 Low back pain: Secondary | ICD-10-CM | POA: Insufficient documentation

## 2017-09-10 DIAGNOSIS — Z5321 Procedure and treatment not carried out due to patient leaving prior to being seen by health care provider: Secondary | ICD-10-CM | POA: Insufficient documentation

## 2017-09-10 DIAGNOSIS — R1084 Generalized abdominal pain: Secondary | ICD-10-CM | POA: Insufficient documentation

## 2017-09-10 NOTE — ED Notes (Signed)
Pt states  "i'll come back in the morning. My back is hurting too bad to wait". Encouraged pt to stay for eval. Declined. Advised she can return at any time. Pt cao x 4 and ambulated from ED with steady gait.

## 2017-09-10 NOTE — ED Triage Notes (Signed)
Patient states that she has had pain to her left flank x 1 week.  The patient denies any N/V  - patient states that she has had increase in urinary frequency

## 2017-09-11 ENCOUNTER — Encounter (HOSPITAL_BASED_OUTPATIENT_CLINIC_OR_DEPARTMENT_OTHER): Payer: Self-pay | Admitting: Emergency Medicine

## 2017-09-11 ENCOUNTER — Other Ambulatory Visit: Payer: Self-pay

## 2017-09-11 ENCOUNTER — Emergency Department (HOSPITAL_BASED_OUTPATIENT_CLINIC_OR_DEPARTMENT_OTHER)
Admission: EM | Admit: 2017-09-11 | Discharge: 2017-09-11 | Disposition: A | Discharge status: Home / Self Care | Payer: Self-pay | Attending: Emergency Medicine | Admitting: Emergency Medicine

## 2017-09-11 DIAGNOSIS — Z3202 Encounter for pregnancy test, result negative: Secondary | ICD-10-CM | POA: Insufficient documentation

## 2017-09-11 DIAGNOSIS — J45909 Unspecified asthma, uncomplicated: Secondary | ICD-10-CM | POA: Insufficient documentation

## 2017-09-11 DIAGNOSIS — M5489 Other dorsalgia: Secondary | ICD-10-CM | POA: Insufficient documentation

## 2017-09-11 DIAGNOSIS — Z79899 Other long term (current) drug therapy: Secondary | ICD-10-CM | POA: Insufficient documentation

## 2017-09-11 DIAGNOSIS — Z91040 Latex allergy status: Secondary | ICD-10-CM | POA: Insufficient documentation

## 2017-09-11 LAB — URINALYSIS, ROUTINE W REFLEX MICROSCOPIC
BILIRUBIN URINE: NEGATIVE
GLUCOSE, UA: NEGATIVE mg/dL
HGB URINE DIPSTICK: NEGATIVE
KETONES UR: NEGATIVE mg/dL
Leukocytes, UA: NEGATIVE
NITRITE: NEGATIVE
Protein, ur: NEGATIVE mg/dL
Specific Gravity, Urine: >1.03 — ABNORMAL HIGH (ref 1.005–1.030)
pH: 5.5 (ref 5.0–8.0)

## 2017-09-11 LAB — PREGNANCY, URINE: Preg Test, Ur: NEGATIVE

## 2017-09-11 MED ORDER — NAPROXEN 500 MG PO TABS: 500.0000 mg | ORAL_TABLET | Freq: Two times a day (BID) | ORAL | 0 refills | Status: DC

## 2017-09-11 MED ORDER — METHOCARBAMOL 500 MG PO TABS: 500.0000 mg | ORAL_TABLET | Freq: Every evening | ORAL | 0 refills | Status: DC | PRN

## 2017-09-11 NOTE — ED Triage Notes (Signed)
Patient states that she has had pain to her left flank x 1 week.  The patient denies any N/V  - patient states that she has had increase in urinary frequency  - was here last night and had to leave without being seen

## 2017-09-11 NOTE — ED Provider Notes (Signed)
[facility] EMERGENCY DEPARTMENT Provider Note   CSN: [id number] Arrival date & time: 09/11/17  0935     History   Chief Complaint Chief Complaint  Patient presents with  . Flank Pain    HPI Karen Simon is a 27 y.o. female with no past medical history presenting with 1 week of left mid back pain.  She does not recall any injury.  Pain is aggravated with movement and palpation.  She denies any urinary symptoms, loss of bowel bladder function, fever, chills, numbness, weakness, history of IV drug use.  No abdominal pain, nausea, vomiting. Patient has not tried anything for her symptoms.  HPI  Past Medical History:  Diagnosis Date  . Anemia   . Asthma   . Enlarged heart   . H/O candidiasis   . H/O varicella   . Irregular periods/menstrual cycles 07/01/09  . Migraines   . Morbid obesity (HCC) 09/17/2015    Patient Active Problem List   Diagnosis Date Noted  . Morbid obesity (HCC) 09/17/2015  . Normal labor 02/23/2014  . Lactating mother 11/27/2011  . Teen pregnancy 11/27/2011  . Pregnant state, incidental 11/25/2011  . Vaginal delivery 11/25/2011  . Perineal laceration complicating delivery 11/25/2011  . Candida albicans infection 11/15/2011  . Excess weight gain in pregnancy 11/07/2011  . Anemia in pregnancy 09/14/2011  . History of high blood pressure - never took meds 04/12/2011  . 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 History of anemia 11/26/2010  . Asthma 11/26/2010  . Migraine 11/26/2010    Past Surgical History:  Procedure Laterality Date  . CHOLECYSTECTOMY  03/20/2012   Procedure: LAPAROSCOPIC CHOLECYSTECTOMY;  Surgeon: Shelly Rubenstein, MD;  Location: [facility];  Service: General;  Laterality: N/A;  . CHOLECYSTECTOMY, LAPAROSCOPIC N/A 2013     OB History    Gravida  2   Para  2   Term  2   Preterm  0   AB  0   Living  2     SAB  0   TAB  0   Ectopic  0   Multiple  0   Live Births  2              Home Medications    Prior to Admission medications   Medication Sig Start Date End Date Taking? Authorizing Provider  benzonatate (TESSALON PERLES) 100 MG capsule Take 1 capsule (100 mg total) 3 (three) times daily as needed by mouth for cough. 03/21/17   Mackuen, Courteney Lyn, MD  famotidine (PEPCID) 20 MG tablet Take 1 tablet (20 mg total) by mouth 2 (two) times daily. 05/07/17   Palumbo, April, MD  guaiFENesin-codeine 100-10 MG/5ML syrup Take 5 mLs every 6 (six) hours as needed by mouth for cough. 03/21/17   Mackuen, Courteney Lyn, MD  methocarbamol (ROBAXIN) 500 MG tablet Take 1 tablet (500 mg total) by mouth at bedtime as needed. 09/11/17   Mathews Robinsons B, PA-C  naproxen (NAPROSYN) 500 MG tablet Take 1 tablet (500 mg total) by mouth 2 (two) times daily. 09/11/17   Mathews Robinsons B, PA-C  ondansetron (ZOFRAN ODT) 8 MG disintegrating tablet  ODT q8 hours prn nausea 05/07/17   Palumbo, April, MD    Family History Family History  Problem Relation Age of Onset  . Hypertension Mother   . Cancer Maternal Grandmother        breast  . Cancer Maternal Grandfather        colon  .  Anesthesia problems Neg Hx     Social History Social History   Tobacco Use  . Smoking status: Never Smoker  . Smokeless tobacco: Never Used  Substance Use Topics  . Alcohol use: Yes    Comment: occassionally   . Drug use: No     Allergies   Penicillins; Iodine; Latex; Shellfish allergy; and Tomato   Review of Systems Review of Systems  Constitutional: Negative for chills, diaphoresis, fatigue, fever and unexpected weight change.  Respiratory: Negative for cough, chest tightness, shortness of breath, wheezing and stridor.   Cardiovascular: Negative for chest pain and leg swelling.  Gastrointestinal: Negative for abdominal pain, nausea and vomiting.  Genitourinary: Negative for decreased urine volume, difficulty urinating, dysuria, flank pain, frequency, hematuria and pelvic pain.   Musculoskeletal: Positive for back pain and myalgias. Negative for arthralgias, gait problem, joint swelling, neck pain and neck stiffness.  Skin: Negative for color change, pallor and rash.  Neurological: Negative for dizziness, weakness, light-headedness, numbness and headaches.     Physical Exam Updated Vital Signs BP 128/76 (BP Location: Left Arm)   Pulse 73   Temp 97.8 F (36.6 C) (Oral)   Resp 18   Ht  (1.651 m)   Wt 131.5 kg (290 lb)   LMP 09/11/2017   SpO2 100%   BMI 48.26 kg/m   Physical Exam  Constitutional: She appears well-developed and well-nourished. No distress.  Afebrile, nontoxic-appearing, sitting comfortably in bed no acute distress.  HENT:  Head: Normocephalic.  Eyes: Right eye exhibits no discharge. Left eye exhibits no discharge.  Neck: Normal range of motion. Neck supple.  Cardiovascular: Normal rate, regular rhythm, normal heart sounds and intact distal pulses.  Pulmonary/Chest: Effort normal and breath sounds normal. No stridor. No respiratory distress. She has no wheezes.  Abdominal: Soft. She exhibits no distension.  Musculoskeletal: Normal range of motion. She exhibits tenderness. She exhibits no edema.  No midline tenderness palpation of the spine.  Tender to palpation of the left paraspinal musculature  Neurological: She is alert. No sensory deficit. She exhibits normal muscle tone.  5/5 strength in lower extremities bilaterally, sensation intact, neurovascularly intact.  Normal stance and gait normal balance.  Skin: Skin is warm and dry. No rash noted. She is not diaphoretic. No erythema. No pallor.  Psychiatric: She has a normal mood and affect. Her behavior is normal.  Nursing note and vitals reviewed.    ED Treatments / Results  Labs (all labs ordered are listed, but only abnormal results are displayed) Labs Reviewed  URINALYSIS, ROUTINE W REFLEX MICROSCOPIC - Abnormal; Notable for the following components:      Result Value    Specific Gravity, Urine >1.030 (*)    All other components within normal limits  PREGNANCY, URINE    EKG None  Radiology No results found.  Procedures Procedures (including critical care time)  Medications Ordered in ED Medications - No data to display   Initial Impression / Assessment and Plan / ED Course  I have reviewed the triage vital signs and the nursing notes.  Pertinent labs & imaging results that were available during my care of the patient were reviewed by me and considered in my medical decision making (see chart for details).    Patient presents with lower back pain.  No gross neurological deficits and normal neuro exam.  Patient has no gait abnormality or concern for cauda equina.  No loss of bowel or bladder control, fever, night sweats, weight loss, h/o malignancy, or  IVDU.   Patient is well-appearing, nontoxic afebrile.  Urine negative for UTI, no concern for pregnancy.  RICE protocol and pain medications indicated and discussed with patient.   Discharge home with symptomatic relief and close follow-up with PCP.  Discussed return precautions and patient understood and agreed with plan.  Final Clinical Impressions(s) / ED Diagnoses   Final diagnoses:  Left paraspinal back pain    ED Discharge Orders        Ordered    naproxen (NAPROSYN) 500 MG tablet  2 times daily     09/11/17 1214    methocarbamol (ROBAXIN) 500 MG tablet  At bedtime PRN     09/11/17 1214       Gregary Cromer 09/11/17 1621    Mesner, Barbara Cower, MD 09/11/17 [phone]

## 2017-09-11 NOTE — Discharge Instructions (Signed)
As discussed, take naproxen twice a day with food and Tylenol every 6 hours as needed in between.  You may take up to 1000 mg Tylenol per dose but do not exceed 4000 mg in a 24-hour period.  The medication prescribed can help with muscle spasm but cannot be taken if driving, operating machinery, with alcohol.  Take at nighttime as needed.  Continue with massage, warm compresses and icy hot muscle creams.  Follow-up with your primary care provider in a week if symptoms persist. Return if symptoms worsen, fever, chills, pain on urination, or other new concerning symptoms in the meantime.

## 2017-11-28 ENCOUNTER — Emergency Department (HOSPITAL_BASED_OUTPATIENT_CLINIC_OR_DEPARTMENT_OTHER): Payer: Self-pay

## 2017-11-28 ENCOUNTER — Encounter (HOSPITAL_BASED_OUTPATIENT_CLINIC_OR_DEPARTMENT_OTHER): Payer: Self-pay | Admitting: Emergency Medicine

## 2017-11-28 ENCOUNTER — Emergency Department (HOSPITAL_BASED_OUTPATIENT_CLINIC_OR_DEPARTMENT_OTHER)
Admission: EM | Admit: 2017-11-28 | Discharge: 2017-11-28 | Disposition: A | Discharge status: Home / Self Care | Payer: Self-pay | Attending: Emergency Medicine | Admitting: Emergency Medicine

## 2017-11-28 ENCOUNTER — Other Ambulatory Visit: Payer: Self-pay

## 2017-11-28 DIAGNOSIS — Z91040 Latex allergy status: Secondary | ICD-10-CM | POA: Insufficient documentation

## 2017-11-28 DIAGNOSIS — J45909 Unspecified asthma, uncomplicated: Secondary | ICD-10-CM | POA: Insufficient documentation

## 2017-11-28 DIAGNOSIS — Z79899 Other long term (current) drug therapy: Secondary | ICD-10-CM | POA: Insufficient documentation

## 2017-11-28 DIAGNOSIS — Z9049 Acquired absence of other specified parts of digestive tract: Secondary | ICD-10-CM | POA: Insufficient documentation

## 2017-11-28 DIAGNOSIS — R109 Unspecified abdominal pain: Secondary | ICD-10-CM

## 2017-11-28 DIAGNOSIS — R110 Nausea: Secondary | ICD-10-CM | POA: Insufficient documentation

## 2017-11-28 DIAGNOSIS — R1012 Left upper quadrant pain: Secondary | ICD-10-CM | POA: Insufficient documentation

## 2017-11-28 LAB — CBC WITH DIFFERENTIAL/PLATELET
Basophils Absolute: 0 10*3/uL (ref 0.0–0.1)
Basophils Relative: 0 %
Eosinophils Absolute: 0.2 10*3/uL (ref 0.0–0.7)
Eosinophils Relative: 3 %
HCT: 37.2 % (ref 36.0–46.0)
Hemoglobin: 12.1 g/dL (ref 12.0–15.0)
Lymphocytes Relative: 30 %
Lymphs Abs: 2.3 10*3/uL (ref 0.7–4.0)
MCH: 26.1 pg (ref 26.0–34.0)
MCHC: 32.5 g/dL (ref 30.0–36.0)
MCV: 80.2 fL (ref 78.0–100.0)
Monocytes Absolute: 0.3 10*3/uL (ref 0.1–1.0)
Monocytes Relative: 5 %
Neutro Abs: 4.8 10*3/uL (ref 1.7–7.7)
Neutrophils Relative %: 62 %
Platelets: 294 10*3/uL (ref 150–400)
RBC: 4.64 MIL/uL (ref 3.87–5.11)
RDW: 14.4 % (ref 11.5–15.5)
WBC: 7.6 10*3/uL (ref 4.0–10.5)

## 2017-11-28 LAB — URINALYSIS, ROUTINE W REFLEX MICROSCOPIC
BILIRUBIN URINE: NEGATIVE
GLUCOSE, UA: NEGATIVE mg/dL
Hgb urine dipstick: NEGATIVE
Ketones, ur: NEGATIVE mg/dL
Leukocytes, UA: NEGATIVE
NITRITE: NEGATIVE
PH: 6 (ref 5.0–8.0)
Protein, ur: NEGATIVE mg/dL
SPECIFIC GRAVITY, URINE: 1.01 (ref 1.005–1.030)

## 2017-11-28 LAB — LIPASE, BLOOD: Lipase: 29 U/L (ref 11–51)

## 2017-11-28 LAB — WET PREP, GENITAL
Clue Cells Wet Prep HPF POC: NONE SEEN
Sperm: NONE SEEN
Trich, Wet Prep: NONE SEEN
Yeast Wet Prep HPF POC: NONE SEEN

## 2017-11-28 LAB — COMPREHENSIVE METABOLIC PANEL
ALT: 39 U/L (ref 0–44)
AST: 25 U/L (ref 15–41)
Albumin: 3.7 g/dL (ref 3.5–5.0)
Alkaline Phosphatase: 77 U/L (ref 38–126)
Anion gap: 7 (ref 5–15)
BUN: 14 mg/dL (ref 6–20)
CO2: 28 mmol/L (ref 22–32)
Calcium: 9 mg/dL (ref 8.9–10.3)
Chloride: 103 mmol/L (ref 98–111)
Creatinine, Ser: 0.58 mg/dL (ref 0.44–1.00)
GFR calc Af Amer: >60 mL/min (ref >60)
GFR calc non Af Amer: >60 mL/min (ref >60)
Glucose, Bld: 104 mg/dL — ABNORMAL HIGH (ref 70–99)
Potassium: 4 mmol/L (ref 3.5–5.1)
Sodium: 138 mmol/L (ref 135–145)
Total Bilirubin: 0.4 mg/dL (ref 0.3–1.2)
Total Protein: 7.7 g/dL (ref 6.5–8.1)

## 2017-11-28 LAB — PREGNANCY, URINE: Preg Test, Ur: NEGATIVE

## 2017-11-28 MED ORDER — SUCRALFATE 1 GM/10ML PO SUSP: 1.0000 g | Freq: Three times a day (TID) | ORAL | 0 refills | Status: DC

## 2017-11-28 MED ORDER — ONDANSETRON HCL 4 MG PO TABS: 4.0000 mg | ORAL_TABLET | Freq: Four times a day (QID) | ORAL | 0 refills | Status: DC

## 2017-11-28 MED ORDER — FAMOTIDINE IN NACL 20-0.9 MG/50ML-% IV SOLN
20.0000 mg | Freq: Once | INTRAVENOUS | Status: AC
  Administered 2017-11-28: 20 mg via INTRAVENOUS

## 2017-11-28 MED ORDER — FAMOTIDINE 20 MG PO TABS: 20.0000 mg | ORAL_TABLET | Freq: Two times a day (BID) | ORAL | 0 refills | Status: DC

## 2017-11-28 NOTE — Discharge Instructions (Signed)
Medications: Pepcid, Carafate, Zofran  Treatment: Take Pepcid as prescribed to help with your symptoms.  Take Carafate before eating and before bed.  Take Zofran every 6 hours as needed for nausea or vomiting.  Limit or avoid completely NSAID medication such as ibuprofen and Aleve.  Follow-up: Please follow-up and establish care with a primary care provider and/or see the gastroenterologist if your symptoms are not improving.  Please return the emergency department if you develop any new or worsening symptoms including severe worsening abdominal pain that is localized, fevers, intractable vomiting, bloody stool, or any other concerning symptoms.

## 2017-11-28 NOTE — ED Provider Notes (Signed)
[facility] EMERGENCY DEPARTMENT Provider Note   CSN: [id number] Arrival date & time: 11/28/17  1020     History   Chief Complaint Chief Complaint  Patient presents with  . Abdominal Pain    HPI Karen Simon is a 27 y.o. female with history of migraines, asthma, obesity who presents with a one-week history of abdominal pain.  Patient reports her pain started off as left upper quadrant pain it has moved over the past 2 days to her left lower quadrant.  She continues to have left upper quadrant pain as well.  She describes that pain as a cramp, but the lower abdominal pain as bloating.  She has had associated nausea, but no vomiting or diarrhea.  She denies any bloody stools, urinary symptoms, abnormal vaginal bleeding or discharge.  She is not taking any medications at home for symptoms.  She reports taking NSAIDs about every other day for her headaches.  She denies any alcohol use, recent travel or new foods.  She does report that she has changed her diet to a plant and fish-based diet over the past month.  HPI  Past Medical History:  Diagnosis Date  . Anemia   . Asthma   . Enlarged heart   . H/O candidiasis   . H/O varicella   . Irregular periods/menstrual cycles 07/01/09  . Migraines   . Morbid obesity (HCC) 09/17/2015    Patient Active Problem List   Diagnosis Date Noted  . Morbid obesity (HCC) 09/17/2015  . Normal labor 02/23/2014  . Lactating mother 11/27/2011  . Teen pregnancy 11/27/2011  . Pregnant state, incidental 11/25/2011  . Vaginal delivery 11/25/2011  . Perineal laceration complicating delivery 11/25/2011  . Candida albicans infection 11/15/2011  . Excess weight gain in pregnancy 11/07/2011  . Anemia in pregnancy 09/14/2011  . History of high blood pressure - never took meds 04/12/2011  . 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 History of anemia 11/26/2010  . Asthma 11/26/2010  . Migraine 11/26/2010    Past Surgical  History:  Procedure Laterality Date  . CHOLECYSTECTOMY  03/20/2012   Procedure: LAPAROSCOPIC CHOLECYSTECTOMY;  Surgeon: Shelly Rubensteinouglas A Blackman, MD;  Location: [facility];  Service: General;  Laterality: N/A;  . CHOLECYSTECTOMY, LAPAROSCOPIC N/A 2013     OB History    Gravida  2   Para  2   Term  2   Preterm  0   AB  0   Living  2     SAB  0   TAB  0   Ectopic  0   Multiple  0   Live Births  2            Home Medications    Prior to Admission medications   Medication Sig Start Date End Date Taking? Authorizing Provider  benzonatate (TESSALON PERLES) 100 MG capsule Take 1 capsule (100 mg total) 3 (three) times daily as needed by mouth for cough. 03/21/17   Mackuen, Courteney Lyn, MD  famotidine (PEPCID) 20 MG tablet Take 1 tablet (20 mg total) by mouth 2 (two) times daily. 11/28/17   Vernal Rutan, Waylan BogaAlexandra M, PA-C  guaiFENesin-codeine 100-10 MG/5ML syrup Take 5 mLs every 6 (six) hours as needed by mouth for cough. 03/21/17   Mackuen, Courteney Lyn, MD  methocarbamol (ROBAXIN) 500 MG tablet Take 1 tablet (500 mg total) by mouth at bedtime as needed. 09/11/17   Mathews RobinsonsMitchell, Jessica B, PA-C  naproxen (NAPROSYN) 500 MG tablet Take 1  tablet (500 mg total) by mouth 2 (two) times daily. 09/11/17   Mathews Robinsons B, PA-C  ondansetron (ZOFRAN) 4 MG tablet Take 1 tablet (4 mg total) by mouth every 6 (six) hours. 11/28/17   Naesha Buckalew, Waylan Boga, PA-C  sucralfate (CARAFATE) 1 GM/10ML suspension Take 10 mLs (1 g total) by mouth 4 (four) times daily -  with meals and at bedtime. 11/28/17   Emi Holes, PA-C    Family History Family History  Problem Relation Age of Onset  . Hypertension Mother   . Cancer Maternal Grandmother        breast  . Cancer Maternal Grandfather        colon  . Anesthesia problems Neg Hx     Social History Social History   Tobacco Use  . Smoking status: Never Smoker  . Smokeless tobacco: Never Used  Substance Use Topics  . Alcohol use: Yes    Comment:  occassionally   . Drug use: No     Allergies   Penicillins; Iodine; Latex; Shellfish allergy; and Tomato   Review of Systems Review of Systems  Constitutional: Negative for chills and fever.  HENT: Negative for facial swelling and sore throat.   Respiratory: Negative for shortness of breath.   Cardiovascular: Negative for chest pain.  Gastrointestinal: Positive for abdominal pain and nausea. Negative for blood in stool, diarrhea and vomiting.  Genitourinary: Negative for dysuria, vaginal bleeding and vaginal discharge.  Musculoskeletal: Negative for back pain.  Skin: Negative for rash and wound.  Neurological: Negative for headaches.  Psychiatric/Behavioral: The patient is not nervous/anxious.      Physical Exam Updated Vital Signs BP 113/70 (BP Location: Left Arm)   Pulse 77   Temp 98.4 F (36.9 C) (Oral)   Resp 18   Ht 5\' 5"  (1.651 m)   Wt 130.2 kg (287 lb)   LMP 11/20/2017 (Approximate)   SpO2 100%   BMI 47.76 kg/m   Physical Exam  Constitutional: She appears well-developed and well-nourished. No distress.  HENT:  Head: Normocephalic and atraumatic.  Mouth/Throat: Oropharynx is clear and moist. No oropharyngeal exudate.  Eyes: Pupils are equal, round, and reactive to light. Conjunctivae are normal. Right eye exhibits no discharge. Left eye exhibits no discharge. No scleral icterus.  Neck: Normal range of motion. Neck supple. No thyromegaly present.  Cardiovascular: Normal rate, regular rhythm, normal heart sounds and intact distal pulses. Exam reveals no gallop and no friction rub.  No murmur heard. Pulmonary/Chest: Effort normal and breath sounds normal. No stridor. No respiratory distress. She has no wheezes. She has no rales.  Abdominal: Soft. Bowel sounds are normal. She exhibits no distension. There is tenderness in the left upper quadrant. There is no rebound, no guarding and no CVA tenderness.  Genitourinary: Uterus normal. Cervix exhibits no motion  tenderness. Right adnexum displays no tenderness. Left adnexum displays tenderness (mild, pressure only). Vaginal discharge (mostly clear, probably physiologic) found.  Musculoskeletal: She exhibits no edema.  Lymphadenopathy:    She has no cervical adenopathy.  Neurological: She is alert. Coordination normal.  Skin: Skin is warm and dry. No rash noted. She is not diaphoretic. No pallor.  Psychiatric: She has a normal mood and affect.  Nursing note and vitals reviewed.    ED Treatments / Results  Labs (all labs ordered are listed, but only abnormal results are displayed) Labs Reviewed  WET PREP, GENITAL - Abnormal; Notable for the following components:      Result Value   WBC,  Wet Prep HPF POC MANY (*)    All other components within normal limits  COMPREHENSIVE METABOLIC PANEL - Abnormal; Notable for the following components:   Glucose, Bld 104 (*)    All other components within normal limits  URINALYSIS, ROUTINE W REFLEX MICROSCOPIC  PREGNANCY, URINE  CBC WITH DIFFERENTIAL/PLATELET  LIPASE, BLOOD  GC/CHLAMYDIA PROBE AMP ([place]) NOT AT [facility]    EKG None  Radiology US Transvaginal Non-ob  Result Date: 11/28/2017 CLINICAL DATA:  Left lower quadrant pain for 1 week. No known injury. EXAM: TRANSABDOMINAL AND TRANSVAGINAL ULTRASOUND OF PELVIS DOPPLER ULTRASOUND OF OVARIES TECHNIQUE: Both transabdominal and transvaginal ultrasound examinations of the pelvis were performed. Transabdominal technique was performed for global imaging of the pelvis including uterus, ovaries, adnexal regions, and pelvic cul-de-sac. It was necessary to proceed with endovaginal exam following the transabdominal exam to visualize the adnexa. Color and duplex Doppler ultrasound was utilized to evaluate blood flow to the ovaries. COMPARISON:  None. FINDINGS: Uterus Measurements: 8.8 x 4.6 x 5.2 cm. No fibroids or other mass visualized. Endometrium Thickness: 10 mm.  No focal abnormality visualized. Right ovary  Measurements: 3.4 x 2.4 x 2.1 cm. Simple cyst versus dominant follicle measuring 1.8 cm in diameter incidentally noted. Left ovary Measurements: 3.4 x 1.9 x 2.1 cm. Normal appearance/no adnexal mass. Pulsed Doppler evaluation of both ovaries demonstrates normal low-resistance arterial and venous waveforms. Other findings No abnormal free fluid. IMPRESSION: Negative exam.  No finding to explain the patient's symptoms. Electronically Signed   By: Drusilla Kanner M.D.   On: 11/28/2017 13:17   US Pelvis Complete  Result Date: 11/28/2017 CLINICAL DATA:  Left lower quadrant pain for 1 week. No known injury. EXAM: TRANSABDOMINAL AND TRANSVAGINAL ULTRASOUND OF PELVIS DOPPLER ULTRASOUND OF OVARIES TECHNIQUE: Both transabdominal and transvaginal ultrasound examinations of the pelvis were performed. Transabdominal technique was performed for global imaging of the pelvis including uterus, ovaries, adnexal regions, and pelvic cul-de-sac. It was necessary to proceed with endovaginal exam following the transabdominal exam to visualize the adnexa. Color and duplex Doppler ultrasound was utilized to evaluate blood flow to the ovaries. COMPARISON:  None. FINDINGS: Uterus Measurements: 8.8 x 4.6 x 5.2 cm. No fibroids or other mass visualized. Endometrium Thickness: 10 mm.  No focal abnormality visualized. Right ovary Measurements: 3.4 x 2.4 x 2.1 cm. Simple cyst versus dominant follicle measuring 1.8 cm in diameter incidentally noted. Left ovary Measurements: 3.4 x 1.9 x 2.1 cm. Normal appearance/no adnexal mass. Pulsed Doppler evaluation of both ovaries demonstrates normal low-resistance arterial and venous waveforms. Other findings No abnormal free fluid. IMPRESSION: Negative exam.  No finding to explain the patient's symptoms. Electronically Signed   By: Drusilla Kanner M.D.   On: 11/28/2017 13:17   [place] Art/ven Flow Abd Pelv Doppler  Result Date: 11/28/2017 CLINICAL DATA:  Left lower quadrant pain for 1 week. No known  injury. EXAM: TRANSABDOMINAL AND TRANSVAGINAL ULTRASOUND OF PELVIS DOPPLER ULTRASOUND OF OVARIES TECHNIQUE: Both transabdominal and transvaginal ultrasound examinations of the pelvis were performed. Transabdominal technique was performed for global imaging of the pelvis including uterus, ovaries, adnexal regions, and pelvic cul-de-sac. It was necessary to proceed with endovaginal exam following the transabdominal exam to visualize the adnexa. Color and duplex Doppler ultrasound was utilized to evaluate blood flow to the ovaries. COMPARISON:  None. FINDINGS: Uterus Measurements: 8.8 x 4.6 x 5.2 cm. No fibroids or other mass visualized. Endometrium Thickness: 10 mm.  No focal abnormality visualized. Right ovary Measurements: 3.4 x 2.4 x 2.1  cm. Simple cyst versus dominant follicle measuring 1.8 cm in diameter incidentally noted. Left ovary Measurements: 3.4 x 1.9 x 2.1 cm. Normal appearance/no adnexal mass. Pulsed Doppler evaluation of both ovaries demonstrates normal low-resistance arterial and venous waveforms. Other findings No abnormal free fluid. IMPRESSION: Negative exam.  No finding to explain the patient's symptoms. Electronically Signed   By: Drusilla Kanner M.D.   On: 11/28/2017 13:17    Procedures Procedures (including critical care time)  Medications Ordered in ED Medications  famotidine (PEPCID) IVPB 20 mg premix (0 mg Intravenous Stopped 11/28/17 1135)     Initial Impression / Assessment and Plan / ED Course  I have reviewed the triage vital signs and the nursing notes.  Pertinent labs & imaging results that were available during my care of the patient were reviewed by me and considered in my medical decision making (see chart for details).     Patient with 1 week history of abdominal pain.  Suspect gastritis related to NSAID use.  Labs are unremarkable.  Pelvic ultrasound is negative.  We will treat with Pepcid, Carafate, Zofran and limit NSAID use.  Will refer to PCP versus GI.  Wet  prep is negative.  GC/chlamydia sent and pending, although patient has very low suspicion of STD exposure.  Return precautions discussed.  Patient understands and agrees with plan.  Patient vitals stable throughout ED course and discharged in satisfactory condition.  Patient also evaluated by Dr. Madilyn Hook who guided the patient's management and agrees with plan.  Final Clinical Impressions(s) / ED Diagnoses   Final diagnoses:  Abdominal pain, unspecified abdominal location  Nausea    ED Discharge Orders        Ordered    famotidine (PEPCID) 20 MG tablet  2 times daily     11/28/17 1410    sucralfate (CARAFATE) 1 GM/10ML suspension  3 times daily with meals & bedtime     11/28/17 1410    ondansetron (ZOFRAN) 4 MG tablet  Every 6 hours     11/28/17 1410       Kari Montero, [place], PA-C 11/28/17 1541    Tilden Fossa, MD 11/30/17 [phone]

## 2017-11-28 NOTE — ED Triage Notes (Signed)
Patient reports left sided abdominal pain x 1 week.  Endorses nausea with vomiting or diarrhea.  States that the pain started as upper abdominal pain on the left but has now moved to the LLQ.

## 2017-11-29 LAB — GC/CHLAMYDIA PROBE AMP (~~LOC~~) NOT AT ARMC
Chlamydia: NEGATIVE
Neisseria Gonorrhea: NEGATIVE

## 2018-01-25 ENCOUNTER — Encounter (HOSPITAL_BASED_OUTPATIENT_CLINIC_OR_DEPARTMENT_OTHER): Payer: Self-pay

## 2018-01-25 ENCOUNTER — Emergency Department (HOSPITAL_BASED_OUTPATIENT_CLINIC_OR_DEPARTMENT_OTHER)
Admission: EM | Admit: 2018-01-25 | Discharge: 2018-01-25 | Disposition: A | Discharge status: Home / Self Care | Payer: Medicaid Other | Attending: Emergency Medicine | Admitting: Emergency Medicine

## 2018-01-25 ENCOUNTER — Other Ambulatory Visit: Payer: Self-pay

## 2018-01-25 DIAGNOSIS — J45909 Unspecified asthma, uncomplicated: Secondary | ICD-10-CM | POA: Insufficient documentation

## 2018-01-25 DIAGNOSIS — Z91040 Latex allergy status: Secondary | ICD-10-CM | POA: Insufficient documentation

## 2018-01-25 DIAGNOSIS — I10 Essential (primary) hypertension: Secondary | ICD-10-CM | POA: Insufficient documentation

## 2018-01-25 DIAGNOSIS — R51 Headache: Secondary | ICD-10-CM | POA: Insufficient documentation

## 2018-01-25 DIAGNOSIS — R519 Headache, unspecified: Secondary | ICD-10-CM

## 2018-01-25 DIAGNOSIS — Z79899 Other long term (current) drug therapy: Secondary | ICD-10-CM | POA: Insufficient documentation

## 2018-01-25 MED ORDER — DIPHENHYDRAMINE HCL 50 MG/ML IJ SOLN
25.0000 mg | Freq: Once | INTRAMUSCULAR | Status: AC
  Administered 2018-01-25: 25 mg via INTRAMUSCULAR
  Filled 2018-01-25: qty 1

## 2018-01-25 MED ORDER — PROCHLORPERAZINE EDISYLATE 10 MG/2ML IJ SOLN
10.0000 mg | Freq: Once | INTRAMUSCULAR | Status: AC
  Administered 2018-01-25: 10 mg via INTRAMUSCULAR
  Filled 2018-01-25: qty 2

## 2018-01-25 MED ORDER — DEXAMETHASONE 6 MG PO TABS
10.0000 mg | ORAL_TABLET | Freq: Once | ORAL | Status: AC
  Administered 2018-01-25: 10 mg via ORAL
  Filled 2018-01-25: qty 1

## 2018-01-25 NOTE — ED Triage Notes (Signed)
Pt c/o headache for the last three days, went to [employer] and checked her BP and it was 151/83, so patient is concerned. She took aleve two days ago for her headache and it helped, but states it came back. Denies chest pain, denies SOB, denies numbness or weakness

## 2018-01-25 NOTE — Discharge Instructions (Signed)
Follow up with your family doc and a neurologist. Return for sudden worsening pain, fevers, neck pain.

## 2018-01-25 NOTE — ED Provider Notes (Signed)
[facility] EMERGENCY DEPARTMENT Provider Note   CSN: [id number] Arrival date & time: 01/25/18  2112     History   Chief Complaint Chief Complaint  Patient presents with  . Hypertension    HPI Karen Simon is a 27 y.o. female.  27 yo F with a chief complaint of a headache.  This is in the left frontal region been going on for the past couple days.  Nothing seems to make it better or worse.  Feels just like her prior migraines though less intense.  Patient had had a acupuncture procedure and since then has not had a significant headache.  Is been ongoing a few years.  She denies fevers or chills.  Denies neck pain.  Denies trauma.  The history is provided by the patient and a relative.  Hypertension  This is a new problem. The current episode started 2 days ago. The problem occurs constantly. The problem has not changed since onset.Associated symptoms include headaches. Pertinent negatives include no chest pain and no shortness of breath. Nothing aggravates the symptoms. Nothing relieves the symptoms. She has tried nothing for the symptoms. The treatment provided no relief.    Past Medical History:  Diagnosis Date  . Anemia   . Asthma   . Enlarged heart   . H/O candidiasis   . H/O varicella   . Irregular periods/menstrual cycles 07/01/09  . Migraines   . Morbid obesity (HCC) 09/17/2015    Patient Active Problem List   Diagnosis Date Noted  . Morbid obesity (HCC) 09/17/2015  . Normal labor 02/23/2014  . Lactating mother 11/27/2011  . Teen pregnancy 11/27/2011  . Pregnant state, incidental 11/25/2011  . Vaginal delivery 11/25/2011  . Perineal laceration complicating delivery 11/25/2011  . Candida albicans infection 11/15/2011  . Excess weight gain in pregnancy 11/07/2011  . Anemia in pregnancy 09/14/2011  . History of high blood pressure - never took meds 04/12/2011  . 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 History of anemia  11/26/2010  . Asthma 11/26/2010  . Migraine 11/26/2010    Past Surgical History:  Procedure Laterality Date  . CHOLECYSTECTOMY  03/20/2012   Procedure: LAPAROSCOPIC CHOLECYSTECTOMY;  Surgeon: Shelly Rubenstein, MD;  Location: [facility];  Service: General;  Laterality: N/A;  . CHOLECYSTECTOMY, LAPAROSCOPIC N/A 2013     OB History    Gravida  2   Para  2   Term  2   Preterm  0   AB  0   Living  2     SAB  0   TAB  0   Ectopic  0   Multiple  0   Live Births  2            Home Medications    Prior to Admission medications   Medication Sig Start Date End Date Taking? Authorizing Provider  benzonatate (TESSALON PERLES) 100 MG capsule Take 1 capsule (100 mg total) 3 (three) times daily as needed by mouth for cough. 03/21/17   Mackuen, Courteney Lyn, MD  famotidine (PEPCID) 20 MG tablet Take 1 tablet (20 mg total) by mouth 2 (two) times daily. 11/28/17   Law, Waylan Boga, PA-C  guaiFENesin-codeine 100-10 MG/5ML syrup Take 5 mLs every 6 (six) hours as needed by mouth for cough. 03/21/17   Mackuen, Courteney Lyn, MD  methocarbamol (ROBAXIN) 500 MG tablet Take 1 tablet (500 mg total) by mouth at bedtime as needed. 09/11/17   Georgiana Shore, PA-C  naproxen (NAPROSYN) 500 MG tablet Take 1 tablet (500 mg total) by mouth 2 (two) times daily. 09/11/17   Mathews RobinsonsMitchell, Jessica B, PA-C  ondansetron (ZOFRAN) 4 MG tablet Take 1 tablet (4 mg total) by mouth every 6 (six) hours. 11/28/17   Law, Waylan BogaAlexandra M, PA-C  sucralfate (CARAFATE) 1 GM/10ML suspension Take 10 mLs (1 g total) by mouth 4 (four) times daily -  with meals and at bedtime. 11/28/17   Emi HolesLaw, Alexandra M, PA-C    Family History Family History  Problem Relation Age of Onset  . Hypertension Mother   . Cancer Maternal Grandmother        breast  . Cancer Maternal Grandfather        colon  . Anesthesia problems Neg Hx     Social History Social History   Tobacco Use  . Smoking status: Never Smoker  . Smokeless tobacco:  Never Used  Substance Use Topics  . Alcohol use: Yes    Comment: occassionally   . Drug use: No     Allergies   Penicillins; Iodine; Latex; Shellfish allergy; and Tomato   Review of Systems Review of Systems  Constitutional: Negative for chills and fever.  HENT: Negative for congestion and rhinorrhea.   Eyes: Negative for redness and visual disturbance.  Respiratory: Negative for shortness of breath and wheezing.   Cardiovascular: Negative for chest pain and palpitations.  Gastrointestinal: Negative for nausea and vomiting.  Genitourinary: Negative for dysuria and urgency.  Musculoskeletal: Negative for arthralgias and myalgias.  Skin: Negative for pallor and wound.  Neurological: Positive for headaches. Negative for dizziness.     Physical Exam Updated Vital Signs BP 129/81 (BP Location: Left Arm)   Pulse 75   Temp 98.4 F (36.9 C) (Oral)   Resp 19   Ht 5\' 5"  (1.651 m)   Wt 130.2 kg   LMP 01/14/2018   SpO2 100%   BMI 47.76 kg/m   Physical Exam  Constitutional: She is oriented to person, place, and time. She appears well-developed and well-nourished. No distress.  HENT:  Head: Normocephalic and atraumatic.  TM normal bilaterally, no noted sinus tenderness.   Eyes: Pupils are equal, round, and reactive to light. EOM are normal.  Neck: Normal range of motion. Neck supple.  Cardiovascular: Normal rate and regular rhythm. Exam reveals no gallop and no friction rub.  No murmur heard. Pulmonary/Chest: Effort normal. She has no wheezes. She has no rales.  Abdominal: Soft. She exhibits no distension. There is no tenderness.  Musculoskeletal: She exhibits no edema or tenderness.  Neurological: She is alert and oriented to person, place, and time. She has normal strength. No cranial nerve deficit or sensory deficit. She displays a negative Romberg sign. Coordination and gait normal. GCS eye subscore is 4. GCS verbal subscore is 5. GCS motor subscore is 6.  Benign neuro  exam  Skin: Skin is warm and dry. She is not diaphoretic.  Psychiatric: She has a normal mood and affect. Her behavior is normal.  Nursing note and vitals reviewed.    ED Treatments / Results  Labs (all labs ordered are listed, but only abnormal results are displayed) Labs Reviewed - No data to display  EKG None  Radiology No results found.  Procedures Procedures (including critical care time)  Medications Ordered in ED Medications  prochlorperazine (COMPAZINE) injection 10 mg (has no administration in time range)  diphenhydrAMINE (BENADRYL) injection 25 mg (has no administration in time range)  dexamethasone (DECADRON) tablet 10 mg (has  no administration in time range)     Initial Impression / Assessment and Plan / ED Course  I have reviewed the triage vital signs and the nursing notes.  Pertinent labs & imaging results that were available during my care of the patient were reviewed by me and considered in my medical decision making (see chart for details).     27 yo F with a chief complaint of a headache.  This is typical of her headaches that she is to have in the past the left severe.  She has no neurologic deficit.  She is really here because her blood pressure was mildly elevated at a [employer].  Here the blood pressure is normal.  We will give a headache cocktail.  Her back to neurology.  PCP follow-up for blood pressure.   9:33 PM:  I have discussed the diagnosis/risks/treatment options with the patient and family and believe the pt to be eligible for discharge home to follow-up with PCP. We also discussed returning to the ED immediately if new or worsening sx occur. We discussed the sx which are most concerning (e.g., sudden worsening pain, fever, inability to tolerate by mouth) that necessitate immediate return. Medications administered to the patient during their visit and any new prescriptions provided to the patient are listed below.  Medications given during this  visit Medications  prochlorperazine (COMPAZINE) injection 10 mg (has no administration in time range)  diphenhydrAMINE (BENADRYL) injection 25 mg (has no administration in time range)  dexamethasone (DECADRON) tablet 10 mg (has no administration in time range)     The patient appears reasonably screen and/or stabilized for discharge and I doubt any other medical condition or other [facility] requiring further screening, evaluation, or treatment in the ED at this time prior to discharge.    Final Clinical Impressions(s) / ED Diagnoses   Final diagnoses:  Frontal headache    ED Discharge Orders         Ordered    Ambulatory referral to Neurology    Comments:  Headache syndrome   01/25/18 2130           Melene Plan, DO 01/25/18 2133

## 2018-01-25 NOTE — ED Notes (Signed)
NAD at this time. Pt is stable and going home.  

## 2018-01-26 ENCOUNTER — Encounter: Payer: Self-pay | Admitting: Neurology

## 2018-02-08 ENCOUNTER — Ambulatory Visit (HOSPITAL_COMMUNITY)
Admission: EM | Admit: 2018-02-08 | Discharge: 2018-02-08 | Disposition: A | Discharge status: Home / Self Care | Payer: Medicaid Other | Attending: Family Medicine | Admitting: Family Medicine

## 2018-02-08 ENCOUNTER — Encounter (HOSPITAL_COMMUNITY): Payer: Self-pay | Admitting: Emergency Medicine

## 2018-02-08 DIAGNOSIS — M25541 Pain in joints of right hand: Secondary | ICD-10-CM

## 2018-02-08 MED ORDER — DICLOFENAC SODIUM 75 MG PO TBEC: 75.0000 mg | DELAYED_RELEASE_TABLET | Freq: Two times a day (BID) | ORAL | 0 refills | Status: DC

## 2018-02-08 NOTE — ED Triage Notes (Signed)
PT reports left second finger pain for 2 weeks.

## 2018-02-14 NOTE — ED Provider Notes (Signed)
[facility]   [id number] 02/08/18 Arrival Time: 1644  ASSESSMENT & PLAN:  1. Joint pain in fingers of right hand   Will treat as inflammatory.  Meds ordered this encounter  Medications  . diclofenac (VOLTAREN) 75 MG EC tablet    Sig: Take 1 tablet (75 mg total) by mouth 2 (two) times daily.    Dispense:  20 tablet    Refill:  0    Follow-up Information    [facility].   Specialty:  Urgent Care Why:  As needed. Contact information: [address] [place] [address] [phone]         Encourage ROM of finger as tolerated.  Reviewed expectations re: course of current medical issues. Questions answered. Outlined signs and symptoms indicating need for more acute intervention. Patient verbalized understanding. After Visit Summary given.  SUBJECTIVE: History from: patient. Karen Simon is a 27 y.o. female who reports R 2nd MCP joint pain; gradual onset x 2 weeks; no injury/repetitive use; no home/OTC tx. No specific worsening since this started. No trauma reported. Relieved by: rest. Worsened by: movement. Associated symptoms: none reported. Extremity sensation changes or weakness: none. Self treatment: tried OTCs without relief of pain. History of similar: no No h/o gout.  ROS: As per HPI.   OBJECTIVE:  Vitals:   02/08/18 1721  BP: 118/67  Pulse: 88  Resp: 16  Temp: 98.4 F (36.9 C)  SpO2: 100%    General appearance: alert; no distress Extremities: warm and well perfused; symmetrical with no gross deformities; localized tenderness over her left 2nd MCP joint with no swelling and no bruising; ROM around area or areas of discomfort: normal CV: brisk extremity capillary refill of left 2nd finger Skin: warm and dry Neurologic: normal gait; normal symmetric reflexes in all extremities; normal sensation in all extremities Psychological: alert and cooperative; normal mood and  affect  Allergies  Allergen Reactions  . Penicillins Anaphylaxis, Itching and Swelling  . Iodine Hives  . Latex Hives  . Shellfish Allergy Hives  . Tomato Hives and Swelling    Past Medical History:  Diagnosis Date  . Anemia   . Asthma   . Enlarged heart   . H/O candidiasis   . H/O varicella   . Irregular periods/menstrual cycles 07/01/09  . Migraines   . Morbid obesity (HCC) 09/17/2015   Social History   Socioeconomic History  . Marital status: Married    Spouse name: Not on file  . Number of children: Not on file  . Years of education: Not on file  . Highest education level: Not on file  Occupational History  . Not on file  Social Needs  . Financial resource strain: Not on file  . Food insecurity:    Worry: Not on file    Inability: Not on file  . Transportation needs:    Medical: Not on file    Non-medical: Not on file  Tobacco Use  . Smoking status: Never Smoker  . Smokeless tobacco: Never Used  Substance and Sexual Activity  . Alcohol use: Yes    Comment: occassionally   . Drug use: No  . Sexual activity: Yes    Birth control/protection: None  Lifestyle  . Physical activity:    Days per week: Not on file    Minutes per session: Not on file  . Stress: Not on file  Relationships  . Social connections:    Talks on phone: Not on  file    Gets together: Not on file    Attends religious service: Not on file    Active member of club or organization: Not on file    Attends meetings of clubs or organizations: Not on file    Relationship status: Not on file  Other Topics Concern  . Not on file  Social History Narrative  . Not on file   Family History  Problem Relation Age of Onset  . Hypertension Mother   . Cancer Maternal Grandmother        breast  . Cancer Maternal Grandfather        colon  . Anesthesia problems Neg Hx    Past Surgical History:  Procedure Laterality Date  . CHOLECYSTECTOMY  03/20/2012   Procedure: LAPAROSCOPIC CHOLECYSTECTOMY;   Surgeon: Shelly Rubenstein, MD;  Location: [facility];  Service: General;  Laterality: N/A;  . CHOLECYSTECTOMY, LAPAROSCOPIC N/A 2013      Mardella Layman, MD 02/14/18 [phone]

## 2018-02-27 ENCOUNTER — Encounter (HOSPITAL_COMMUNITY): Payer: Self-pay | Admitting: Emergency Medicine

## 2018-02-27 ENCOUNTER — Ambulatory Visit (HOSPITAL_COMMUNITY)
Admission: EM | Admit: 2018-02-27 | Discharge: 2018-02-27 | Disposition: A | Discharge status: Home / Self Care | Payer: Self-pay | Attending: Family Medicine | Admitting: Family Medicine

## 2018-02-27 DIAGNOSIS — R21 Rash and other nonspecific skin eruption: Secondary | ICD-10-CM

## 2018-02-27 MED ORDER — PREDNISONE 50 MG PO TABS: 50.0000 mg | ORAL_TABLET | Freq: Every day | ORAL | 0 refills | Status: DC

## 2018-02-27 MED ORDER — HYDROXYZINE HCL 25 MG PO TABS: 25.0000 mg | ORAL_TABLET | Freq: Four times a day (QID) | ORAL | 0 refills | Status: DC

## 2018-02-27 NOTE — ED Triage Notes (Signed)
Pt c/o rash all over her chest legs and arms x2 weeks.

## 2018-02-27 NOTE — Discharge Instructions (Signed)
Start prednisone as directed. Hydroxyzine for itching. Continue to monitor for any new exposures that could be causing symptoms.

## 2018-02-27 NOTE — ED Provider Notes (Signed)
[facility]    CSN: [id number] Arrival date & time: 02/27/18  1503     History   Chief Complaint Chief Complaint  Patient presents with  . Rash    HPI Karen Simon is a 27 y.o. female.   27 year old female comes in for 2 week history of rash. States started on her chest and is itching in nature. Now on her abdomen, neck and arms. States she used a new detergent, but has switched back since symptom onset. Denies recent URI symptoms. Denies fever, chills, night sweats. Benadryl with benadryl cream without relief.      Past Medical History:  Diagnosis Date  . Anemia   . Asthma   . Enlarged heart   . H/O candidiasis   . H/O varicella   . Irregular periods/menstrual cycles 07/01/09  . Migraines   . Morbid obesity (HCC) 09/17/2015    Patient Active Problem List   Diagnosis Date Noted  . Morbid obesity (HCC) 09/17/2015  . Normal labor 02/23/2014  . Lactating mother 11/27/2011  . Teen pregnancy 11/27/2011  . Pregnant state, incidental 11/25/2011  . Vaginal delivery 11/25/2011  . Perineal laceration complicating delivery 11/25/2011  . Candida albicans infection 11/15/2011  . Excess weight gain in pregnancy 11/07/2011  . Anemia in pregnancy 09/14/2011  . History of high blood pressure - never took meds 04/12/2011  . 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 History of anemia 11/26/2010  . Asthma 11/26/2010  . Migraine 11/26/2010    Past Surgical History:  Procedure Laterality Date  . CHOLECYSTECTOMY  03/20/2012   Procedure: LAPAROSCOPIC CHOLECYSTECTOMY;  Surgeon: Shelly Rubenstein, MD;  Location: [facility];  Service: General;  Laterality: N/A;  . CHOLECYSTECTOMY, LAPAROSCOPIC N/A 2013    OB History    Gravida  2   Para  2   Term  2   Preterm  0   AB  0   Living  2     SAB  0   TAB  0   Ectopic  0   Multiple  0   Live Births  2            Home Medications    Prior to Admission medications     Medication Sig Start Date End Date Taking? Authorizing Provider  hydrOXYzine (ATARAX/VISTARIL) 25 MG tablet Take 1 tablet (25 mg total) by mouth every 6 (six) hours. 02/27/18   Cathie Hoops, Adeana Grilliot V, PA-C  predniSONE (DELTASONE) 50 MG tablet Take 1 tablet (50 mg total) by mouth daily. 02/27/18   Belinda Fisher, PA-C    Family History Family History  Problem Relation Age of Onset  . Hypertension Mother   . Cancer Maternal Grandmother        breast  . Cancer Maternal Grandfather        colon  . Anesthesia problems Neg Hx     Social History Social History   Tobacco Use  . Smoking status: Never Smoker  . Smokeless tobacco: Never Used  Substance Use Topics  . Alcohol use: Yes    Comment: occassionally   . Drug use: No     Allergies   Penicillins; Iodine; Latex; Shellfish allergy; and Tomato   Review of Systems Review of Systems  Reason unable to perform ROS: See HPI as above.     Physical Exam Triage Vital Signs ED Triage Vitals  Enc Vitals Group     BP 02/27/18 1511 (!) 128/58  Pulse Rate 02/27/18 1511 63     Resp 02/27/18 1511 18     Temp 02/27/18 1511 98.4 F (36.9 C)     Temp src --      SpO2 02/27/18 1511 100 %     Weight --      Height --      Head Circumference --      Peak Flow --      Pain Score 02/27/18 1510 0     Pain Loc --      Pain Edu? --      Excl. in GC? --    No data found.  Updated Vital Signs BP (!) 128/58   Pulse 63   Temp 98.4 F (36.9 C)   Resp 18   LMP 02/05/2018   SpO2 100%   Physical Exam  Constitutional: She is oriented to person, place, and time. She appears well-developed and well-nourished. No distress.  HENT:  Head: Normocephalic and atraumatic.  Eyes: Pupils are equal, round, and reactive to light. Conjunctivae are normal.  Neurological: She is alert and oriented to person, place, and time.  Skin: Skin is warm and dry. She is not diaphoretic.  Maculopapular rash to the chest without surrounding erythema. Small oval/round  lesion to the abdomen and few to the back. No obvious "christmas tree" distribution.    UC Treatments / Results  Labs (all labs ordered are listed, but only abnormal results are displayed) Labs Reviewed - No data to display  EKG None  Radiology No results found.  Procedures Procedures (including critical care time)  Medications Ordered in UC Medications - No data to display  Initial Impression / Assessment and Plan / UC Course  I have reviewed the triage vital signs and the nursing notes.  Pertinent labs & imaging results that were available during my care of the patient were reviewed by me and considered in my medical decision making (see chart for details).    Discussed possible pityriasis rosea vs irritant dermatitis. Will provide prednisone as directed. Hydroxyzine for itching. Return precautions given. Patient expresses understanding and agrees to plan.  Final Clinical Impressions(s) / UC Diagnoses   Final diagnoses:  Rash    ED Prescriptions    Medication Sig Dispense Auth. Provider   predniSONE (DELTASONE) 50 MG tablet Take 1 tablet (50 mg total) by mouth daily. 5 tablet Twan Harkin V, PA-C   hydrOXYzine (ATARAX/VISTARIL) 25 MG tablet Take 1 tablet (25 mg total) by mouth every 6 (six) hours. 12 tablet Threasa Alpha, [place] 02/27/18 1541

## 2018-03-21 NOTE — Progress Notes (Deleted)
NEUROLOGY CONSULTATION NOTE  Karen Simon MRN: [id number] DOB: Mar 06, 1991  Referring provider: Melene Planan Floyd (ED referral) Primary care provider: ***  Reason for consult: Headaches  HISTORY OF PRESENT ILLNESS: Karen Simon is a 27 year old ***-handed female who presents for headaches.  History supplemented by referring providers note.  Onset:  *** Location:  *** Quality:  *** Intensity:  ***.  *** denies new headache, thunderclap headache or severe headache that wakes *** from sleep. Aura:  *** Prodrome:  *** Postdrome:  *** Associated symptoms:  ***.  *** denies associated unilateral numbness or weakness. Duration:  *** Frequency:  *** Frequency of abortive medication: *** Triggers:  *** Exacerbating factors:  *** Relieving factors:  *** Activity:  *** On occasion, her headaches are intractable and need to be treated with a headache cocktail at the ED.  Current NSAIDS: *** Current analgesics:  *** Current triptans:  *** Current ergotamine:  *** Current anti-emetic: *** Current muscle relaxants: *** Current anti-anxiolytic:  *** Current sleep aide:  *** Current Antihypertensive medications:  *** Current Antidepressant medications:  *** Current Anticonvulsant medications:  *** Current anti-CGRP:  *** Current Vitamins/Herbal/Supplements:  *** Current Antihistamines/Decongestants:  *** Other therapy:  *** Other medication:  ***  Past NSAIDS:  *** Past analgesics:  *** Past abortive triptans:  *** Past abortive ergotamine:  *** Past muscle relaxants:  *** Past anti-emetic:  *** Past antihypertensive medications:  *** Past antidepressant medications:  *** Past anticonvulsant medications:  *** Past anti-CGRP:  *** Past vitamins/Herbal/Supplements:  *** Past antihistamines/decongestants:  *** Other past therapies:  ***  Caffeine:  *** Alcohol:  *** Smoker:  *** Diet:  *** Exercise:  *** Depression:  ***; Anxiety:  *** Other pain:  *** Sleep  hygiene:  *** Family history of headache:  ***  CBC and CMP from 11/28/2017 were unremarkable.  PAST MEDICAL HISTORY: Past Medical History:  Diagnosis Date  . Anemia   . Asthma   . Enlarged heart   . H/O candidiasis   . H/O varicella   . Irregular periods/menstrual cycles 07/01/09  . Migraines   . Morbid obesity (HCC) 09/17/2015    PAST SURGICAL HISTORY: Past Surgical History:  Procedure Laterality Date  . CHOLECYSTECTOMY  03/20/2012   Procedure: LAPAROSCOPIC CHOLECYSTECTOMY;  Surgeon: Shelly Rubensteinouglas A Blackman, MD;  Location: [facility];  Service: General;  Laterality: N/A;  . CHOLECYSTECTOMY, LAPAROSCOPIC N/A 2013    MEDICATIONS: Current Outpatient Medications on File Prior to Visit  Medication Sig Dispense Refill  . hydrOXYzine (ATARAX/VISTARIL) 25 MG tablet Take 1 tablet (25 mg total) by mouth every 6 (six) hours. 12 tablet 0  . predniSONE (DELTASONE) 50 MG tablet Take 1 tablet (50 mg total) by mouth daily. 5 tablet 0   No current facility-administered medications on file prior to visit.     ALLERGIES: Allergies  Allergen Reactions  . Penicillins Anaphylaxis, Itching and Swelling  . Iodine Hives  . Latex Hives  . Shellfish Allergy Hives  . Tomato Hives and Swelling    FAMILY HISTORY: Family History  Problem Relation Age of Onset  . Hypertension Mother   . Cancer Maternal Grandmother        breast  . Cancer Maternal Grandfather        colon  . Anesthesia problems Neg Hx    ***.  SOCIAL HISTORY: Social History   Socioeconomic History  . Marital status: Married    Spouse name: Not on file  . Number of children: Not on file  . Years  of education: Not on file  . Highest education level: Not on file  Occupational History  . Not on file  Social Needs  . Financial resource strain: Not on file  . Food insecurity:    Worry: Not on file    Inability: Not on file  . Transportation needs:    Medical: Not on file    Non-medical: Not on file  Tobacco Use  . Smoking  status: Never Smoker  . Smokeless tobacco: Never Used  Substance and Sexual Activity  . Alcohol use: Yes    Comment: occassionally   . Drug use: No  . Sexual activity: Yes    Birth control/protection: None  Lifestyle  . Physical activity:    Days per week: Not on file    Minutes per session: Not on file  . Stress: Not on file  Relationships  . Social connections:    Talks on phone: Not on file    Gets together: Not on file    Attends religious service: Not on file    Active member of club or organization: Not on file    Attends meetings of clubs or organizations: Not on file    Relationship status: Not on file  . Intimate partner violence:    Fear of current or ex partner: Not on file    Emotionally abused: Not on file    Physically abused: Not on file    Forced sexual activity: Not on file  Other Topics Concern  . Not on file  Social History Narrative  . Not on file    REVIEW OF SYSTEMS: Constitutional: No fevers, chills, or sweats, no generalized fatigue, change in appetite Eyes: No visual changes, double vision, eye pain Ear, nose and throat: No hearing loss, ear pain, nasal congestion, sore throat Cardiovascular: No chest pain, palpitations Respiratory:  No shortness of breath at rest or with exertion, wheezes GastrointestinaI: No nausea, vomiting, diarrhea, abdominal pain, fecal incontinence Genitourinary:  No dysuria, urinary retention or frequency Musculoskeletal:  No neck pain, back pain Integumentary: No rash, pruritus, skin lesions Neurological: as above Psychiatric: No depression, insomnia, anxiety Endocrine: No palpitations, fatigue, diaphoresis, mood swings, change in appetite, change in weight, increased thirst Hematologic/Lymphatic:  No purpura, petechiae. Allergic/Immunologic: no itchy/runny eyes, nasal congestion, recent allergic reactions, rashes  PHYSICAL EXAM: *** General: No acute distress.  Patient appears ***-groomed.  *** Head:   Normocephalic/atraumatic Eyes:  fundi examined but not visualized Neck: supple, no paraspinal tenderness, full range of motion Back: No paraspinal tenderness Heart: regular rate and rhythm Lungs: Clear to auscultation bilaterally. Vascular: No carotid bruits. Neurological Exam: Mental status: alert and oriented to person, place, and time, recent and remote memory intact, fund of knowledge intact, attention and concentration intact, speech fluent and not dysarthric, language intact. Cranial nerves: CN I: not tested CN II: pupils equal, round and reactive to light, visual fields intact CN III, IV, VI:  full range of motion, no nystagmus, no ptosis CN V: facial sensation intact CN VII: upper and lower face symmetric CN VIII: hearing intact CN IX, X: gag intact, uvula midline CN XI: sternocleidomastoid and trapezius muscles intact CN XII: tongue midline Bulk & Tone: normal, no fasciculations. Motor:  5/5 throughout *** Sensation:  Pinprick *** temperature *** and vibration sensation intact.  ***. Deep Tendon Reflexes:  2+ throughout, *** toes downgoing.  *** Finger to nose testing:  Without dysmetria.  *** Heel to shin:  Without dysmetria.  *** Gait:  Normal station and stride.  Able to turn and tandem walk. Romberg ***.  IMPRESSION: ***  PLAN: ***  Thank you for allowing me to take part in the care of this patient.  Shon Millet, DO  CC: ***

## 2018-03-23 ENCOUNTER — Ambulatory Visit: Payer: Medicaid Other | Admitting: Neurology

## 2018-05-05 ENCOUNTER — Emergency Department (HOSPITAL_BASED_OUTPATIENT_CLINIC_OR_DEPARTMENT_OTHER): Payer: Medicaid Other

## 2018-05-05 ENCOUNTER — Other Ambulatory Visit: Payer: Self-pay

## 2018-05-05 ENCOUNTER — Encounter (HOSPITAL_BASED_OUTPATIENT_CLINIC_OR_DEPARTMENT_OTHER): Payer: Self-pay | Admitting: *Deleted

## 2018-05-05 ENCOUNTER — Emergency Department (HOSPITAL_BASED_OUTPATIENT_CLINIC_OR_DEPARTMENT_OTHER)
Admission: EM | Admit: 2018-05-05 | Discharge: 2018-05-05 | Discharge status: Against Medical Advice | Payer: Medicaid Other | Attending: Emergency Medicine | Admitting: Emergency Medicine

## 2018-05-05 DIAGNOSIS — Z5321 Procedure and treatment not carried out due to patient leaving prior to being seen by health care provider: Secondary | ICD-10-CM | POA: Insufficient documentation

## 2018-05-05 DIAGNOSIS — R509 Fever, unspecified: Secondary | ICD-10-CM | POA: Insufficient documentation

## 2018-05-05 DIAGNOSIS — R0600 Dyspnea, unspecified: Secondary | ICD-10-CM | POA: Insufficient documentation

## 2018-05-05 DIAGNOSIS — M791 Myalgia, unspecified site: Secondary | ICD-10-CM | POA: Insufficient documentation

## 2018-05-05 DIAGNOSIS — R05 Cough: Secondary | ICD-10-CM | POA: Insufficient documentation

## 2018-05-05 DIAGNOSIS — J45909 Unspecified asthma, uncomplicated: Secondary | ICD-10-CM | POA: Insufficient documentation

## 2018-05-05 IMAGING — CR DG CHEST 2V
2 series · 2 of 2 positions shown · non-contrast
Comparison: [DATE]

CLINICAL DATA: Cough for 1.5 weeks. History of asthma.

EXAM:
CHEST - 2 VIEW

[w chest pa]
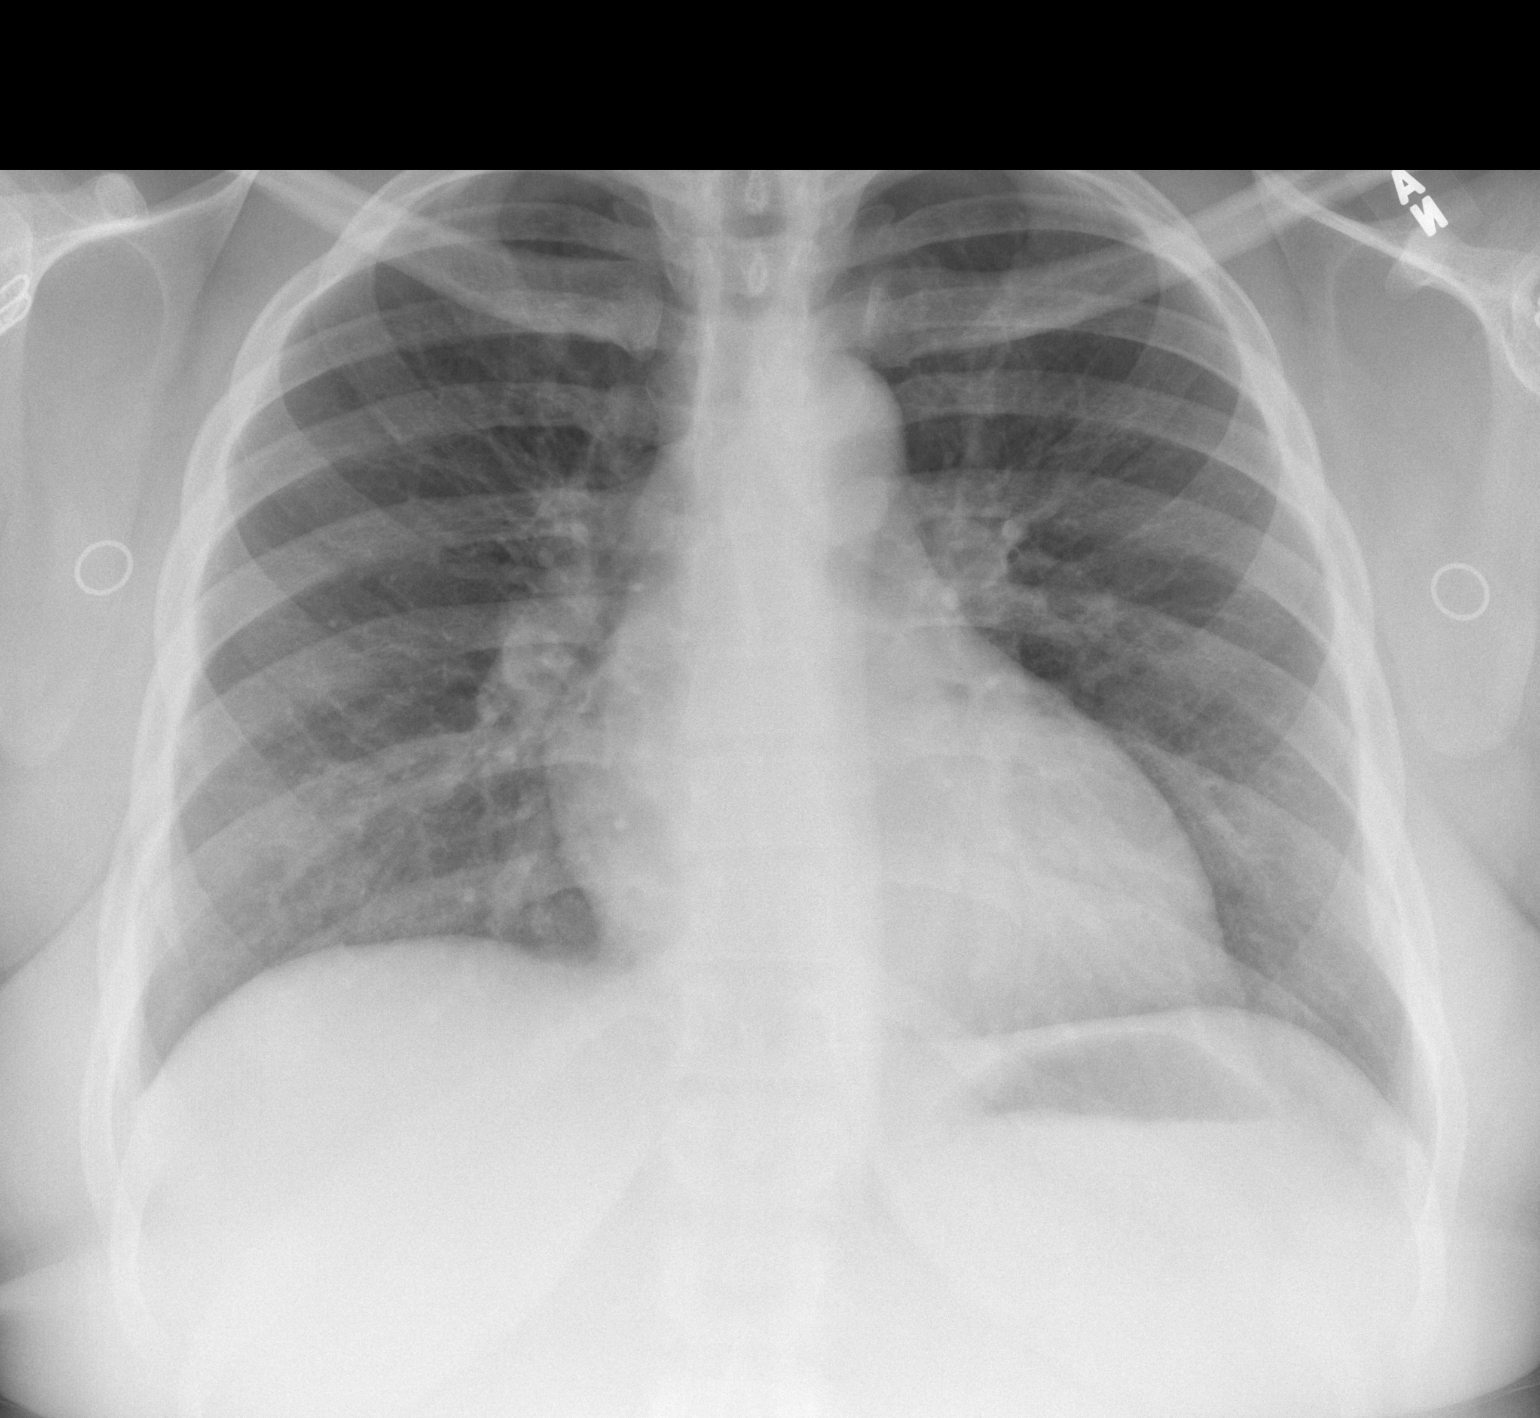

[w chest lat]
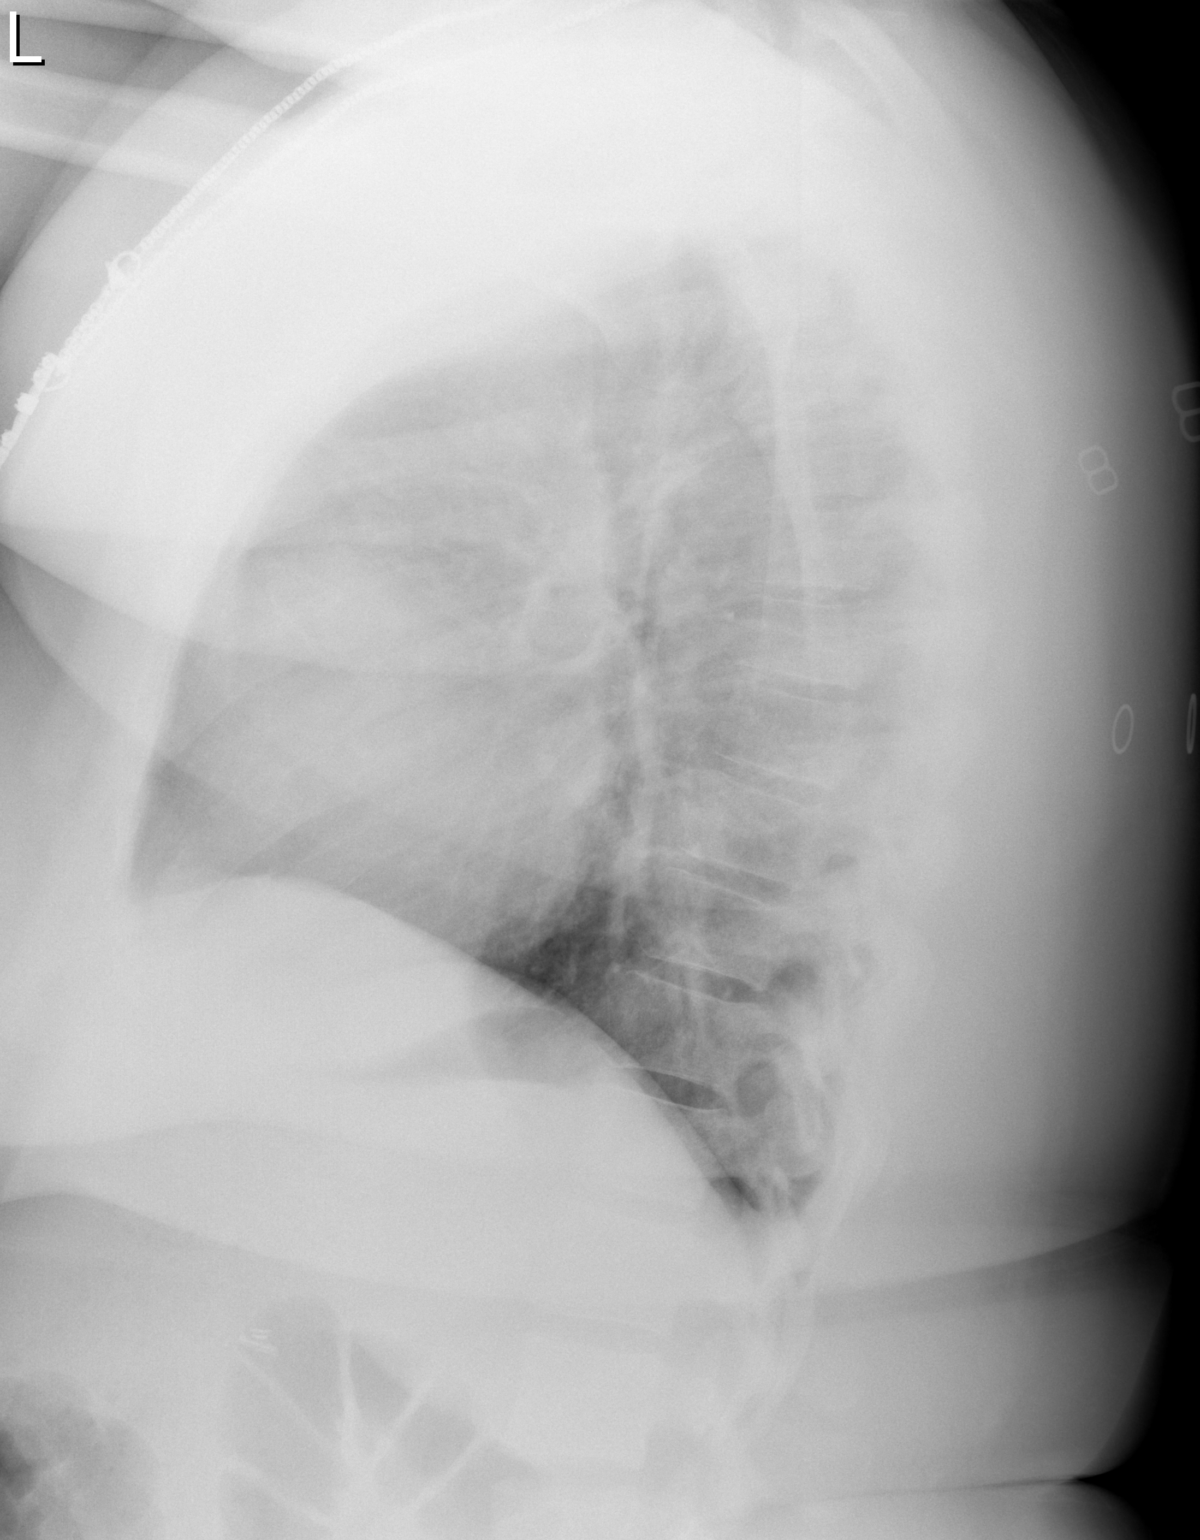

[2 of 2 positions shown; findings below may reference images not displayed]

FINDINGS: Heart size is normal. The lungs are free of focal consolidations and
pleural effusions. No pulmonary edema.
IMPRESSION: No active cardiopulmonary disease.

## 2018-05-05 MED ORDER — ALBUTEROL SULFATE (2.5 MG/3ML) 0.083% IN NEBU
5.0000 mg | INHALATION_SOLUTION | Freq: Once | RESPIRATORY_TRACT | Status: AC
  Administered 2018-05-05: 5 mg via RESPIRATORY_TRACT
  Filled 2018-05-05: qty 6

## 2018-05-05 NOTE — ED Triage Notes (Signed)
Pt reports cough x 1.5 weeks. States she has a hx of asthma (not currently on any meds) and feels "like i'm breathing through a straw". RT in triage to assess

## 2018-05-05 NOTE — ED Notes (Signed)
Pt sts she is leaving. Ambulatory and NAD noted.

## 2018-05-05 NOTE — Progress Notes (Signed)
Patient states that she is breathing better after nebulizer treatment 

## 2018-05-06 ENCOUNTER — Ambulatory Visit (HOSPITAL_COMMUNITY)
Admission: EM | Admit: 2018-05-06 | Discharge: 2018-05-06 | Discharge status: Against Medical Advice | Payer: Medicaid Other

## 2018-05-06 NOTE — ED Notes (Signed)
Per registration, [facility]

## 2018-05-07 ENCOUNTER — Encounter (HOSPITAL_COMMUNITY): Payer: Self-pay | Admitting: Emergency Medicine

## 2018-05-07 ENCOUNTER — Ambulatory Visit (HOSPITAL_COMMUNITY)
Admission: EM | Admit: 2018-05-07 | Discharge: 2018-05-07 | Disposition: A | Discharge status: Home / Self Care | Payer: Medicaid Other

## 2018-05-07 ENCOUNTER — Other Ambulatory Visit: Payer: Self-pay

## 2018-05-07 DIAGNOSIS — J069 Acute upper respiratory infection, unspecified: Secondary | ICD-10-CM | POA: Insufficient documentation

## 2018-05-07 DIAGNOSIS — R05 Cough: Secondary | ICD-10-CM

## 2018-05-07 DIAGNOSIS — R350 Frequency of micturition: Secondary | ICD-10-CM

## 2018-05-07 DIAGNOSIS — B9789 Other viral agents as the cause of diseases classified elsewhere: Secondary | ICD-10-CM | POA: Insufficient documentation

## 2018-05-07 LAB — POCT URINALYSIS DIP (DEVICE)
Bilirubin Urine: NEGATIVE
Glucose, UA: NEGATIVE mg/dL
HGB URINE DIPSTICK: NEGATIVE
Ketones, ur: NEGATIVE mg/dL
Leukocytes, UA: NEGATIVE
NITRITE: NEGATIVE
PH: 7.5 (ref 5.0–8.0)
Protein, ur: NEGATIVE mg/dL
Specific Gravity, Urine: 1.02 (ref 1.005–1.030)
Urobilinogen, UA: 1 mg/dL (ref 0.0–1.0)

## 2018-05-07 MED ORDER — IPRATROPIUM BROMIDE 0.06 % NA SOLN: 2.0000 | Freq: Four times a day (QID) | NASAL | 0 refills | Status: DC

## 2018-05-07 MED ORDER — ACETAMINOPHEN 325 MG PO TABS
ORAL_TABLET | ORAL | Status: AC
  Filled 2018-05-07: qty 2

## 2018-05-07 MED ORDER — BENZONATATE 100 MG PO CAPS: 100.0000 mg | ORAL_CAPSULE | Freq: Three times a day (TID) | ORAL | 0 refills | Status: DC

## 2018-05-07 MED ORDER — ACETAMINOPHEN 325 MG PO TABS
650.0000 mg | ORAL_TABLET | Freq: Once | ORAL | Status: AC
  Administered 2018-05-07: 650 mg via ORAL

## 2018-05-07 NOTE — Discharge Instructions (Signed)
Urine negative for infection. Tessalon for cough. Start atrovent nasal spray for nasal congestion/drainage. You can use over the counter nasal saline rinse such as neti pot for nasal congestion. Keep hydrated, your urine should be clear to pale yellow in color. Tylenol/motrin for fever and pain. Monitor for any worsening of symptoms, chest pain, shortness of breath, wheezing, swelling of the throat, follow up for reevaluation.   For sore throat/cough try using a honey-based tea. Use 3 teaspoons of honey with juice squeezed from half lemon. Place shaved pieces of ginger into 1/2-1 cup of water and warm over stove top. Then mix the ingredients and repeat every 4 hours as needed.

## 2018-05-07 NOTE — ED Triage Notes (Signed)
Cough for 2-3, head stuffy, chest congestion, and phlegm is yellow.

## 2018-05-07 NOTE — ED Provider Notes (Signed)
[facility]    CSN: [id number] Arrival date & time: 05/07/18  1003     History   Chief Complaint Chief Complaint  Patient presents with  . Cough  . Appointment    10:15    HPI Karen Simon is a 27 y.o. female.   27 year old female comes in for 3 day history of URI symptoms. Has had rhinorrhea, nasal congestion cough. She has also noticed urinary frequency, but no obvious dysuria, hematuria. Denies abdominal pain, nausea, vomiting. Has noticed some back pain. Subjective fever with chills. Sudafed with some relief. Sick contact. Never smoker. No flu shot this year.      Past Medical History:  Diagnosis Date  . Anemia   . Asthma   . Enlarged heart   . H/O candidiasis   . H/O varicella   . Irregular periods/menstrual cycles 07/01/09  . Migraines   . Morbid obesity (HCC) 09/17/2015    Patient Active Problem List   Diagnosis Date Noted  . Morbid obesity (HCC) 09/17/2015  . Normal labor 02/23/2014  . Lactating mother 11/27/2011  . Teen pregnancy 11/27/2011  . Pregnant state, incidental 11/25/2011  . Vaginal delivery 11/25/2011  . Perineal laceration complicating delivery 11/25/2011  . Candida albicans infection 11/15/2011  . Excess weight gain in pregnancy 11/07/2011  . Anemia in pregnancy 09/14/2011  . History of high blood pressure - never took meds 04/12/2011  . 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 History of anemia 11/26/2010  . Asthma 11/26/2010  . Migraine 11/26/2010    Past Surgical History:  Procedure Laterality Date  . CHOLECYSTECTOMY  03/20/2012   Procedure: LAPAROSCOPIC CHOLECYSTECTOMY;  Surgeon: Shelly Rubensteinouglas A Blackman, MD;  Location: [facility];  Service: General;  Laterality: N/A;  . CHOLECYSTECTOMY, LAPAROSCOPIC N/A 2013    OB History    Gravida  2   Para  2   Term  2   Preterm  0   AB  0   Living  2     SAB  0   TAB  0   Ectopic  0   Multiple  0   Live Births  2            Home  Medications    Prior to Admission medications   Medication Sig Start Date End Date Taking? Authorizing Provider  pseudoephedrine (SUDAFED) 30 MG tablet Take 30 mg by mouth every 4 (four) hours as needed for congestion.   Yes [provider]  benzonatate (TESSALON) 100 MG capsule Take 1 capsule (100 mg total) by mouth every 8 (eight) hours. 05/07/18   Cathie HoopsYu, Montay Vanvoorhis V, PA-C  ipratropium (ATROVENT) 0.06 % nasal spray Place 2 sprays into both nostrils 4 (four) times daily. 05/07/18   Belinda FisherYu, Neeraj Housand V, PA-C    Family History Family History  Problem Relation Age of Onset  . Hypertension Mother   . Cancer Maternal Grandmother        breast  . Cancer Maternal Grandfather        colon  . Anesthesia problems Neg Hx     Social History Social History   Tobacco Use  . Smoking status: Never Smoker  . Smokeless tobacco: Never Used  Substance Use Topics  . Alcohol use: Yes    Comment: occassionally   . Drug use: No     Allergies   Penicillins; Iodine; Latex; Shellfish allergy; and Tomato   Review of Systems Review of Systems  Reason unable to  perform ROS: See HPI as above.     Physical Exam Triage Vital Signs ED Triage Vitals  Enc Vitals Group     BP 05/07/18 1038 (!) 113/59     Pulse Rate 05/07/18 1038 (!) 108     Resp 05/07/18 1038 18     Temp 05/07/18 1038 (!) 100.5 F (38.1 C)     Temp Source 05/07/18 1038 Oral     SpO2 05/07/18 1038 99 %     Weight --      Height --      Head Circumference --      Peak Flow --      Pain Score 05/07/18 1035 10     Pain Loc --      Pain Edu? --      Excl. in GC? --    No data found.  Updated Vital Signs BP (!) 113/59 (BP Location: Left Arm) Comment (BP Location): large cuff  Pulse (!) 108   Temp (!) 100.5 F (38.1 C) (Oral)   Resp 18   LMP 04/24/2018 (Approximate)   SpO2 99%   Physical Exam Constitutional:      General: She is not in acute distress.    Appearance: She is well-developed. She is not ill-appearing,  toxic-appearing or diaphoretic.  HENT:     Head: Normocephalic and atraumatic.     Right Ear: Tympanic membrane, ear canal and external ear normal. Tympanic membrane is not erythematous or bulging.     Left Ear: Tympanic membrane, ear canal and external ear normal. Tympanic membrane is not erythematous or bulging.     Nose: Nose normal. No congestion or rhinorrhea.     Right Sinus: No maxillary sinus tenderness or frontal sinus tenderness.     Left Sinus: No maxillary sinus tenderness or frontal sinus tenderness.     Mouth/Throat:     Pharynx: Uvula midline.  Eyes:     Conjunctiva/sclera: Conjunctivae normal.     Pupils: Pupils are equal, round, and reactive to light.  Neck:     Musculoskeletal: Normal range of motion and neck supple.  Cardiovascular:     Rate and Rhythm: Normal rate and regular rhythm.     Heart sounds: Normal heart sounds. No murmur. No friction rub. No gallop.   Pulmonary:     Effort: Pulmonary effort is normal. No respiratory distress.     Breath sounds: Normal breath sounds. No stridor. No decreased breath sounds, wheezing, rhonchi or rales.  Abdominal:     General: Bowel sounds are normal.     Palpations: Abdomen is soft.     Tenderness: There is no abdominal tenderness. There is no right CVA tenderness, left CVA tenderness, guarding or rebound.  Lymphadenopathy:     Cervical: No cervical adenopathy.  Skin:    General: Skin is warm and dry.  Neurological:     Mental Status: She is alert and oriented to person, place, and time.  Psychiatric:        Behavior: Behavior normal.        Judgment: Judgment normal.      UC Treatments / Results  Labs (all labs ordered are listed, but only abnormal results are displayed) Labs Reviewed  POCT URINALYSIS DIP (DEVICE)    EKG None  Radiology Dg Chest 2 View  Result Date: 05/05/2018 CLINICAL DATA:  Cough for 1.5 weeks. History of asthma. EXAM: CHEST - 2 VIEW COMPARISON:  05/11/2017 FINDINGS: Heart size is  normal. The lungs are free of  focal consolidations and pleural effusions. No pulmonary edema. IMPRESSION: No active cardiopulmonary disease. Electronically Signed   By: Norva Pavlov M.D.   On: 05/05/2018 19:51    Procedures Procedures (including critical care time)  Medications Ordered in UC Medications  acetaminophen (TYLENOL) tablet 650 mg (650 mg Oral Given 05/07/18 1046)    Initial Impression / Assessment and Plan / UC Course  I have reviewed the triage vital signs and the nursing notes.  Pertinent labs & imaging results that were available during my care of the patient were reviewed by me and considered in my medical decision making (see chart for details).    Chart review showed that patient was at ED 2 days ago, LWBS, but did have CXR done, which was negative for pneumonia. Lungs clear to auscultation bilaterally without adventitious lung sounds. Will obtain UA given frequency with back pain. Otherwise, treat for viral illness.   Urine negative for infection. Discussed with patient history and exam most consistent with viral URI. Symptomatic treatment as needed. Push fluids. Return precautions given.   Final Clinical Impressions(s) / UC Diagnoses   Final diagnoses:  Viral URI with cough    ED Prescriptions    Medication Sig Dispense Auth. Provider   ipratropium (ATROVENT) 0.06 % nasal spray Place 2 sprays into both nostrils 4 (four) times daily. 15 mL Daishon Chui V, PA-C   benzonatate (TESSALON) 100 MG capsule Take 1 capsule (100 mg total) by mouth every 8 (eight) hours. 21 capsule Threasa Alpha, [place] 05/07/18 1245

## 2018-07-04 ENCOUNTER — Emergency Department (HOSPITAL_BASED_OUTPATIENT_CLINIC_OR_DEPARTMENT_OTHER)
Admission: EM | Admit: 2018-07-04 | Discharge: 2018-07-04 | Disposition: A | Discharge status: Home / Self Care | Payer: Self-pay | Attending: Emergency Medicine | Admitting: Emergency Medicine

## 2018-07-04 ENCOUNTER — Emergency Department (HOSPITAL_BASED_OUTPATIENT_CLINIC_OR_DEPARTMENT_OTHER): Payer: Self-pay

## 2018-07-04 ENCOUNTER — Encounter (HOSPITAL_BASED_OUTPATIENT_CLINIC_OR_DEPARTMENT_OTHER): Payer: Self-pay | Admitting: Emergency Medicine

## 2018-07-04 ENCOUNTER — Other Ambulatory Visit: Payer: Self-pay

## 2018-07-04 DIAGNOSIS — Z91040 Latex allergy status: Secondary | ICD-10-CM | POA: Insufficient documentation

## 2018-07-04 DIAGNOSIS — J069 Acute upper respiratory infection, unspecified: Secondary | ICD-10-CM

## 2018-07-04 DIAGNOSIS — Z79899 Other long term (current) drug therapy: Secondary | ICD-10-CM | POA: Insufficient documentation

## 2018-07-04 DIAGNOSIS — J45909 Unspecified asthma, uncomplicated: Secondary | ICD-10-CM | POA: Insufficient documentation

## 2018-07-04 DIAGNOSIS — B9789 Other viral agents as the cause of diseases classified elsewhere: Secondary | ICD-10-CM | POA: Insufficient documentation

## 2018-07-04 LAB — INFLUENZA PANEL BY PCR (TYPE A & B)
INFLBPCR: NEGATIVE
Influenza A By PCR: NEGATIVE

## 2018-07-04 IMAGING — CR DG CHEST 2V
2 series · 2 of 2 positions shown · non-contrast
Comparison: [DATE]

CLINICAL DATA: Cough and congestion

EXAM:
CHEST - 2 VIEW

[w chest pa]
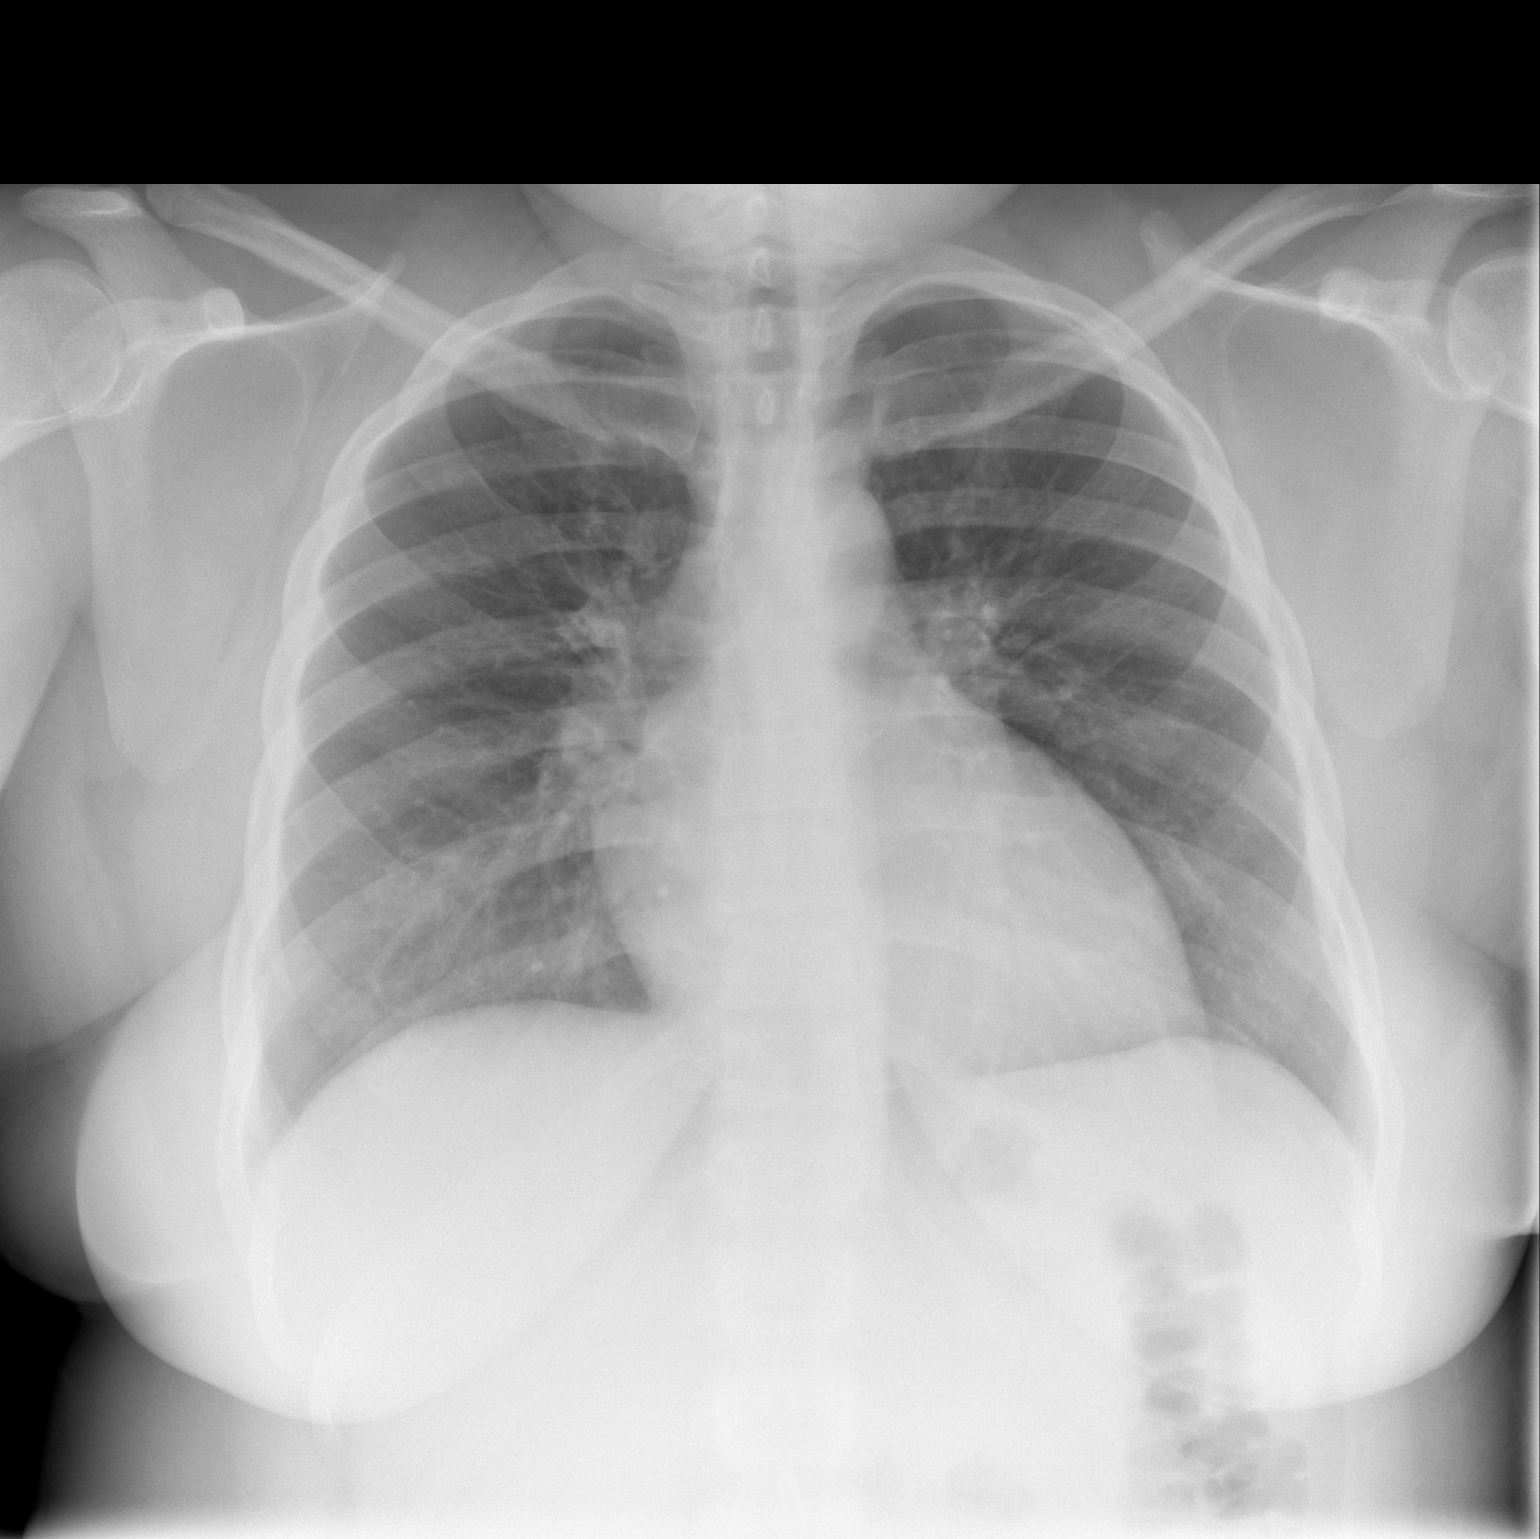

[w chest lat]
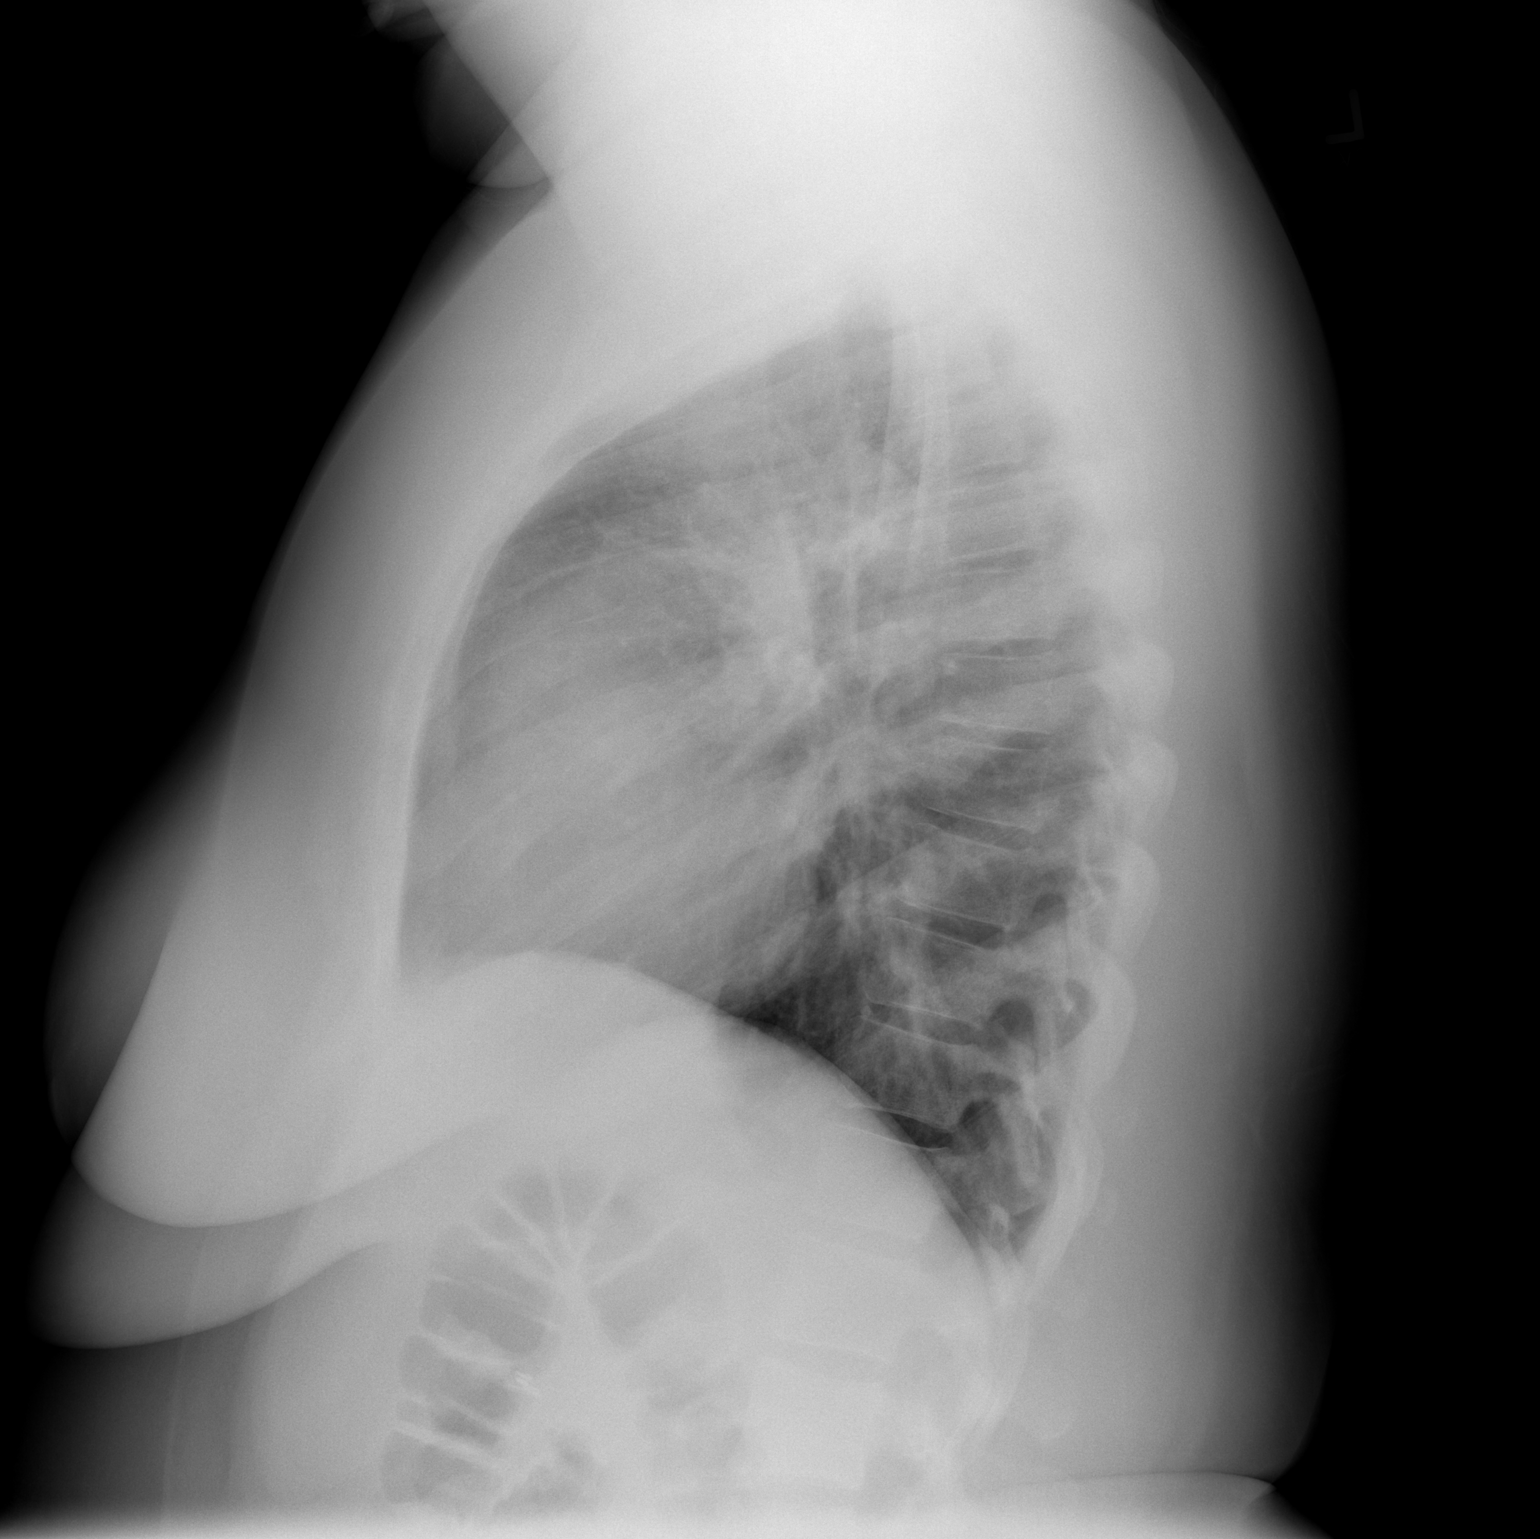

[2 of 2 positions shown; findings below may reference images not displayed]

FINDINGS: Heart size mildly enlarged. Normal vascularity. Negative for
infiltrate effusion or mass. Pulmonary arteries normal in size
IMPRESSION: Mild cardiac enlargement.  No acute abnormality.

## 2018-07-04 MED ORDER — ALBUTEROL SULFATE HFA 108 (90 BASE) MCG/ACT IN AERS: 1.0000 | INHALATION_SPRAY | Freq: Four times a day (QID) | RESPIRATORY_TRACT | 0 refills | Status: DC | PRN

## 2018-07-04 MED ORDER — ACETAMINOPHEN 325 MG PO TABS
650.0000 mg | ORAL_TABLET | Freq: Once | ORAL | Status: AC
  Administered 2018-07-04: 650 mg via ORAL
  Filled 2018-07-04: qty 2

## 2018-07-04 MED ORDER — BENZONATATE 100 MG PO CAPS: 100.0000 mg | ORAL_CAPSULE | Freq: Three times a day (TID) | ORAL | 0 refills | Status: DC

## 2018-07-04 MED ORDER — OSELTAMIVIR PHOSPHATE 75 MG PO CAPS: 75.0000 mg | ORAL_CAPSULE | Freq: Two times a day (BID) | ORAL | 0 refills | Status: DC

## 2018-07-04 MED FILL — PROVENTIL HFA 108 (90 BASE): 108 (90 BAS | 25 days supply | Qty: 7 | Fill #0

## 2018-07-04 MED FILL — BENZONATATE 100 MG CAP: 100 | 7 days supply | Qty: 21 | Fill #0

## 2018-07-04 NOTE — ED Notes (Signed)
C/o cough, congestion runny nose x 2-3 days

## 2018-07-04 NOTE — ED Triage Notes (Signed)
Pt here with cough and heaviness in chest x 2days. Denies nasal congestion, but throat is sore. No fever.

## 2018-07-04 NOTE — ED Provider Notes (Signed)
[facility] EMERGENCY DEPARTMENT Provider Note   CSN: [id number] Arrival date & time: 07/04/18  3536  History   Chief Complaint Chief Complaint  Patient presents with  . Cough  . URI    HPI Karen Simon is a 28 y.o. female with a PMH of Asthma presenting with a productive cough, chest heaviness with coughing, congestion, rhinorrhea, and sore throat onset 2 days ago. Patient states she has tried Theraflu with partial relief. Patient reports wheezing yesterday and used inhaler with relief. Patient denies fever, shortness of breath, chest pain, nausea, vomiting, diarrhea, or abdominal pain. Patient reports chills and fatigue. Patient reports sick contacts by her children at home. Patient denies getting influenza vaccine.     HPI  Past Medical History:  Diagnosis Date  . Anemia   . Asthma   . Enlarged heart   . H/O candidiasis   . H/O varicella   . Irregular periods/menstrual cycles 07/01/09  . Migraines   . Morbid obesity (HCC) 09/17/2015    Patient Active Problem List   Diagnosis Date Noted  . Morbid obesity (HCC) 09/17/2015  . Normal labor 02/23/2014  . Lactating mother 11/27/2011  . Teen pregnancy 11/27/2011  . Pregnant state, incidental 11/25/2011  . Vaginal delivery 11/25/2011  . Perineal laceration complicating delivery 11/25/2011  . Candida albicans infection 11/15/2011  . Excess weight gain in pregnancy 11/07/2011  . Anemia in pregnancy 09/14/2011  . History of high blood pressure - never took meds 04/12/2011  . 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 History of anemia 11/26/2010  . Asthma 11/26/2010  . Migraine 11/26/2010    Past Surgical History:  Procedure Laterality Date  . CHOLECYSTECTOMY  03/20/2012   Procedure: LAPAROSCOPIC CHOLECYSTECTOMY;  Surgeon: Shelly Rubenstein, MD;  Location: [facility];  Service: General;  Laterality: N/A;  . CHOLECYSTECTOMY, LAPAROSCOPIC N/A 2013     OB History    Gravida  2   Para  2    Term  2   Preterm  0   AB  0   Living  2     SAB  0   TAB  0   Ectopic  0   Multiple  0   Live Births  2            Home Medications    Prior to Admission medications   Medication Sig Start Date End Date Taking? Authorizing Provider  albuterol (PROVENTIL HFA;VENTOLIN HFA) 108 (90 Base) MCG/ACT inhaler Inhale 1-2 puffs into the lungs every 6 (six) hours as needed for wheezing or shortness of breath. 07/04/18   Ardyth Harps, Mava Suares P, PA-C  benzonatate (TESSALON) 100 MG capsule Take 1 capsule (100 mg total) by mouth every 8 (eight) hours. 07/04/18   Carlyle Basques P, PA-C  ipratropium (ATROVENT) 0.06 % nasal spray Place 2 sprays into both nostrils 4 (four) times daily. 05/07/18   Belinda Fisher, PA-C  oseltamivir (TAMIFLU) 75 MG capsule Take 1 capsule (75 mg total) by mouth every 12 (twelve) hours. 07/04/18   Carlyle Basques P, PA-C  pseudoephedrine (SUDAFED) 30 MG tablet Take 30 mg by mouth every 4 (four) hours as needed for congestion.    [provider]    Family History Family History  Problem Relation Age of Onset  . Hypertension Mother   . Cancer Maternal Grandmother        breast  . Cancer Maternal Grandfather        colon  . Anesthesia  problems Neg Hx     Social History Social History   Tobacco Use  . Smoking status: Never Smoker  . Smokeless tobacco: Never Used  Substance Use Topics  . Alcohol use: Yes    Comment: occassionally   . Drug use: No     Allergies   Penicillins; Iodine; Latex; Shellfish allergy; and Tomato   Review of Systems Review of Systems  Constitutional: Positive for chills and fatigue. Negative for activity change, appetite change and fever.  HENT: Positive for congestion, rhinorrhea and sore throat. Negative for ear pain and postnasal drip.   Eyes: Negative for pain, redness and itching.  Respiratory: Positive for cough and chest tightness. Negative for shortness of breath.   Cardiovascular: Negative for chest pain.    Gastrointestinal: Negative for abdominal pain, diarrhea, nausea and vomiting.  Genitourinary: Negative for dysuria.  Musculoskeletal: Negative for myalgias.  Skin: Negative for rash.  Allergic/Immunologic: Negative for environmental allergies.  Neurological: Negative for dizziness, weakness and headaches.   Physical Exam Updated Vital Signs BP 135/80 (BP Location: Right Arm)   Pulse 89   Temp 98.2 F (36.8 C) (Oral)   Resp 20   Ht 5\' 5"  (1.651 m)   Wt 134.3 kg   LMP 06/27/2018 (Approximate)   SpO2 98%   BMI 49.26 kg/m   Physical Exam Vitals signs and nursing note reviewed.  Constitutional:      General: She is not in acute distress.    Appearance: She is well-developed. She is not diaphoretic.  HENT:     Head: Normocephalic and atraumatic.     Right Ear: Tympanic membrane, ear canal and external ear normal. No middle ear effusion.     Left Ear: Tympanic membrane, ear canal and external ear normal.  No middle ear effusion.     Nose: Mucosal edema, congestion and rhinorrhea present.     Mouth/Throat:     Pharynx: Uvula midline. No oropharyngeal exudate or posterior oropharyngeal erythema.  Eyes:     General:        Right eye: No discharge.        Left eye: No discharge.     Conjunctiva/sclera: Conjunctivae normal.  Neck:     Musculoskeletal: Normal range of motion and neck supple.  Cardiovascular:     Rate and Rhythm: Normal rate and regular rhythm.     Heart sounds: Normal heart sounds. No murmur. No friction rub. No gallop.   Pulmonary:     Effort: Pulmonary effort is normal. No respiratory distress.     Breath sounds: Normal breath sounds. No wheezing or rales.  Abdominal:     Palpations: Abdomen is soft.     Tenderness: There is no abdominal tenderness.  Musculoskeletal: Normal range of motion.  Lymphadenopathy:     Cervical: No cervical adenopathy.  Skin:    General: Skin is warm.     Findings: No rash.  Neurological:     Mental Status: She is alert.       ED Treatments / Results  Labs (all labs ordered are listed, but only abnormal results are displayed) Labs Reviewed  INFLUENZA PANEL BY PCR (TYPE A & B)    EKG None  Radiology Dg Chest 2 View  Result Date: 07/04/2018 CLINICAL DATA:  Cough and congestion EXAM: CHEST - 2 VIEW COMPARISON:  05/05/2018 FINDINGS: Heart size mildly enlarged. Normal vascularity. Negative for infiltrate effusion or mass. Pulmonary arteries normal in size IMPRESSION: Mild cardiac enlargement.  No acute abnormality. Electronically Signed  By: Marlan Palau M.D.   On: 07/04/2018 10:24    Procedures Procedures (including critical care time)  Medications Ordered in ED Medications  acetaminophen (TYLENOL) tablet 650 mg (650 mg Oral Given 07/04/18 1044)     Initial Impression / Assessment and Plan / ED Course  I have reviewed the triage vital signs and the nursing notes.  Pertinent labs & imaging results that were available during my care of the patient were reviewed by me and considered in my medical decision making (see chart for details).  Clinical Course as of Jul 04 1105  Wed Jul 04, 2018  1031 Mild cardiac enlargement.  No acute abnormality.  DG Chest 2 View [AH]    Clinical Course User Index [AH] Leretha Dykes, PA-C       Patients symptoms are consistent with URI, likely viral etiology. Ordered flu test due to history of asthma. Flu test is pending due to availability at facility. Discussed that antibiotics are not indicated for viral infections. Pt will be discharged with symptomatic treatment and prophylactic Tamiflu. Discussed risks and benefits of Tamiflu. Verbalizes understanding and is agreeable with plan. Pt is hemodynamically stable & in NAD prior to dc.   Final Clinical Impressions(s) / ED Diagnoses   Final diagnoses:  Viral URI with cough    ED Discharge Orders         Ordered    benzonatate (TESSALON) 100 MG capsule  Every 8 hours     07/04/18 1106    oseltamivir  (TAMIFLU) 75 MG capsule  Every 12 hours     07/04/18 1106    albuterol (PROVENTIL HFA;VENTOLIN HFA) 108 (90 Base) MCG/ACT inhaler  Every 6 hours PRN     07/04/18 1106           Carlyle Basques [place], [place] 07/04/18 1107    Tilden Fossa, MD 07/05/18 0700

## 2018-07-04 NOTE — Discharge Instructions (Signed)
You have been seen today for upper respiratory symptoms. Please read and follow all provided instructions.   1. Medications: albuterol for wheezing, tessalon for cough, and Tamiflu, usual home medications 2. Treatment: rest, drink plenty of fluids 3. Follow Up: Please follow up with your primary doctor in 2 days for discussion of your diagnoses and further evaluation after today's visit; if you do not have a primary care doctor use the resource guide provided to find one; Please return to the ER for any new or worsening symptoms. Please obtain all of your results from medical records or have your doctors office obtain the results - share them with your doctor - you should be seen at your doctors office. Call today to arrange your follow up.   Take medications as prescribed. Please review all of the medicines and only take them if you do not have an allergy to them. Return to the emergency room for worsening condition or new concerning symptoms. Follow up with your regular doctor. If you don't have a regular doctor use one of the numbers below to establish a primary care doctor.  Please be aware that if you are taking birth control pills, taking other prescriptions, ESPECIALLY ANTIBIOTICS may make the birth control ineffective - if this is the case, either do not engage in sexual activity or use alternative methods of birth control such as condoms until you have finished the medicine and your family doctor says it is OK to restart them. If you are on a blood thinner such as COUMADIN, be aware that any other medicine that you take may cause the coumadin to either work too much, or not enough - you should have your coumadin level rechecked in next 7 days if this is the case.  ?  It is also a possibility that you have an allergic reaction to any of the medicines that you have been prescribed - Everybody reacts differently to medications and while MOST people have no trouble with most medicines, you may have a  reaction such as nausea, vomiting, rash, swelling, shortness of breath. If this is the case, please stop taking the medicine immediately and contact your physician.  ?  You should return to the ER if you develop severe or worsening symptoms.   Emergency Department Resource Guide 1) Find a Doctor and Pay Out of Pocket Although you won't have to find out who is covered by your insurance plan, it is a good idea to ask around and get recommendations. You will then need to call the office and see if the doctor you have chosen will accept you as a new patient and what types of options they offer for patients who are self-pay. Some doctors offer discounts or will set up payment plans for their patients who do not have insurance, but you will need to ask so you aren't surprised when you get to your appointment.  2) Contact Your Local Health Department Not all health departments have doctors that can see patients for sick visits, but many do, so it is worth a call to see if yours does. If you don't know where your local health department is, you can check in your phone book. The CDC also has a tool to help you locate your state's health department, and many state websites also have listings of all of their local health departments.  3) Find a Walk-in Clinic If your illness is not likely to be very severe or complicated, you may want to try a  walk in clinic. These are popping up all over the country in pharmacies, drugstores, and shopping centers. They're usually staffed by nurse practitioners or physician assistants that have been trained to treat common illnesses and complaints. They're usually fairly quick and inexpensive. However, if you have serious medical issues or chronic medical problems, these are probably not your best option.  No Primary Care Doctor: Call Health Connect at  [phone] - they can help you locate a primary care doctor that  accepts your insurance, provides certain services,  etc. Physician Referral [phone]  Emergency Department Resource Guide 1) Find a Doctor and Pay Out of Pocket Although you won't have to find out who is covered by your insurance plan, it is a good idea to ask around and get recommendations. You will then need to call the office and see if the doctor you have chosen will accept you as a new patient and what types of options they offer for patients who are self-pay. Some doctors offer discounts or will set up payment plans for their patients who do not have insurance, but you will need to ask so you aren't surprised when you get to your appointment.  2) Contact Your Local Health Department Not all health departments have doctors that can see patients for sick visits, but many do, so it is worth a call to see if yours does. If you don't know where your local health department is, you can check in your phone book. The CDC also has a tool to help you locate your state's health department, and many state websites also have listings of all of their local health departments.  3) Find a [facility] If your illness is not likely to be very severe or complicated, you may want to try a walk in clinic. These are popping up all over the country in pharmacies, drugstores, and shopping centers. They're usually staffed by nurse practitioners or physician assistants that have been trained to treat common illnesses and complaints. They're usually fairly quick and inexpensive. However, if you have serious medical issues or chronic medical problems, these are probably not your best option.  No Primary Care Doctor: Call Health Connect at  [phone] - they can help you locate a primary care doctor that  accepts your insurance, provides certain services, etc. Physician Referral Service- [phone]  Chronic Pain Problems: Organization         Address  Phone   Notes  [facility]  [phone] Patients need to be referred by their  primary care doctor.   Medication Assistance: Organization         Address  Phone   Notes  [facility] Medication [facility] [address] [place], [place] [address] [phone] --Must be a resident of [facility] -- Must have NO insurance coverage whatsoever (no Medicaid/ Medicare, etc.) -- The pt. MUST have a primary care doctor that directs their care regularly and follows them in the community   [facility]  [phone]   [employer]  [phone]    Agencies that provide inexpensive medical care: Organization         Address  Phone   Notes  [place]  [phone]   [facility]    [phone]   [facility] [place], [place] [address] [phone]   [place] [address] [place] [phone]   [facility]    [phone]   [facility]    [phone]   [facility] [address], [place] Phone:  [phone], Fax:  [phone] Hours of Operation:  9 am - 6 pm, M-F.  Also accepts Medicaid/Medicare and self-pay.  [facility] for [place], [address], [place] Phone: [phone], Fax: [phone]. Hours of Operation:  8:30 am - 5:30 pm, M-F.  Also accepts Medicaid and self-pay.  [facility] [place] [address], [place] Phone: [phone]   [place], [place], [place] [phone], [address]. [phone] Mondays & Thursdays: 7-9 AM.  First 15 patients are seen on a first come, first serve basis.    Halfway Providers:  Organization         Address  Phone   Notes  [facility] [address], [place] [phone] Also accepts self-pay patients.  [facility] [address], [place]  [phone]   [place], [address], [place] [phone]   [facility] [address] [place] [phone]   Lucianne Lei [address], [place]   [phone] Only accepts [place] [facility] [place] patients after they have their name applied to their card.   Self-Pay (no insurance) in [facility]:  Organization         Address  Phone   Notes  Sickle Cell Patients, [facility] [place] [phone]   [facility] [place] [phone]   [facility] [place]  [place], [place], [place] [phone]   [facility]/Dr. Osei-Bonsu  [address], [place] or [address], [place] [phone] Phone number for both [place] and [place] locations is the same.  [facility] [address] [place] [phone]   [facility] [address], [place] or [address] [phone]   [facility] [address] [place] [phone], phone; [phone], fax Sees patients 1st and 3rd Saturday of every month.  Must not qualify for public or private insurance (i.e. Medicaid, Medicare, [place] Health Choice, Veterans' Benefits)  Household income should be no more than 200% of the poverty level The clinic cannot treat you if you are pregnant or think you are pregnant  Sexually transmitted diseases are not treated at the clinic.

## 2018-07-27 ENCOUNTER — Ambulatory Visit (HOSPITAL_COMMUNITY)
Admission: EM | Admit: 2018-07-27 | Discharge: 2018-07-27 | Disposition: A | Discharge status: Home / Self Care | Payer: Self-pay | Attending: Family Medicine | Admitting: Family Medicine

## 2018-07-27 ENCOUNTER — Encounter (HOSPITAL_COMMUNITY): Payer: Self-pay | Admitting: Family Medicine

## 2018-07-27 ENCOUNTER — Other Ambulatory Visit: Payer: Self-pay

## 2018-07-27 DIAGNOSIS — H6501 Acute serous otitis media, right ear: Secondary | ICD-10-CM

## 2018-07-27 LAB — POCT RAPID STREP A: STREPTOCOCCUS, GROUP A SCREEN (DIRECT): NEGATIVE

## 2018-07-27 MED ORDER — FLUTICASONE PROPIONATE 50 MCG/ACT NA SUSP: 1.0000 | Freq: Every day | NASAL | 2 refills | Status: DC

## 2018-07-27 MED ORDER — AMOXICILLIN 500 MG PO CAPS: 1000.0000 mg | ORAL_CAPSULE | Freq: Three times a day (TID) | ORAL | 0 refills | Status: AC

## 2018-07-27 NOTE — ED Provider Notes (Signed)
[facility]    CSN: [id number] Arrival date & time: 07/27/18  1500     History   Chief Complaint Chief Complaint  Patient presents with  . Otalgia    HPI Karen Simon is a 28 y.o. female.   Pt is a 28 year old female that presents with right ear pain, fullness x 1 week. Symptoms have been constant and remained the same. She had a URI 1 week prior. Son has also been sick. She has also had some throat irration and mild pain with swallowing. PND.  She has not take anything for her symptoms.  She denies any associated fevers, chills, bodies, night sweats, shortness of breath, fatigue.  ROS per HPI    Otalgia    Past Medical History:  Diagnosis Date  . Anemia   . Asthma   . Enlarged heart   . H/O candidiasis   . H/O varicella   . Irregular periods/menstrual cycles 07/01/09  . Migraines   . Morbid obesity (HCC) 09/17/2015    Patient Active Problem List   Diagnosis Date Noted  . Morbid obesity (HCC) 09/17/2015  . Normal labor 02/23/2014  . Lactating mother 11/27/2011  . Teen pregnancy 11/27/2011  . Pregnant state, incidental 11/25/2011  . Vaginal delivery 11/25/2011  . Perineal laceration complicating delivery 11/25/2011  . Candida albicans infection 11/15/2011  . Excess weight gain in pregnancy 11/07/2011  . Anemia in pregnancy 09/14/2011  . History of high blood pressure - never took meds 04/12/2011  . 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 History of anemia 11/26/2010  . Asthma 11/26/2010  . Migraine 11/26/2010    Past Surgical History:  Procedure Laterality Date  . CHOLECYSTECTOMY  03/20/2012   Procedure: LAPAROSCOPIC CHOLECYSTECTOMY;  Surgeon: Shelly Rubenstein, MD;  Location: [facility];  Service: General;  Laterality: N/A;  . CHOLECYSTECTOMY, LAPAROSCOPIC N/A 2013    OB History    Gravida  2   Para  2   Term  2   Preterm  0   AB  0   Living  2     SAB  0   TAB  0   Ectopic  0   Multiple  0   Live Births  2            Home Medications    Prior to Admission medications   Medication Sig Start Date End Date Taking? Authorizing Provider  albuterol (PROVENTIL HFA;VENTOLIN HFA) 108 (90 Base) MCG/ACT inhaler Inhale 1-2 puffs into the lungs every 6 (six) hours as needed for wheezing or shortness of breath. 07/04/18   Ardyth Harps, Ana P, PA-C  amoxicillin (AMOXIL) 500 MG capsule Take 2 capsules (1,000 mg total) by mouth 3 (three) times daily for 7 days. 07/27/18 08/03/18  Dahlia Byes A, NP  benzonatate (TESSALON) 100 MG capsule Take 1 capsule (100 mg total) by mouth every 8 (eight) hours. 07/04/18   Carlyle Basques P, PA-C  fluticasone (FLONASE) 50 MCG/ACT nasal spray Place 1 spray into both nostrils daily. 07/27/18   Keevin Panebianco, Gloris [place] A, NP  ipratropium (ATROVENT) 0.06 % nasal spray Place 2 sprays into both nostrils 4 (four) times daily. 05/07/18   Belinda Fisher, PA-C  oseltamivir (TAMIFLU) 75 MG capsule Take 1 capsule (75 mg total) by mouth every 12 (twelve) hours. 07/04/18   Carlyle Basques P, PA-C  pseudoephedrine (SUDAFED) 30 MG tablet Take 30 mg by mouth every 4 (four) hours as needed for congestion.  [provider]    Family History Family History  Problem Relation Age of Onset  . Hypertension Mother   . Cancer Maternal Grandmother        breast  . Cancer Maternal Grandfather        colon  . Anesthesia problems Neg Hx     Social History Social History   Tobacco Use  . Smoking status: Never Smoker  . Smokeless tobacco: Never Used  Substance Use Topics  . Alcohol use: Yes    Comment: occassionally   . Drug use: No     Allergies   Penicillins; Iodine; Latex; Shellfish allergy; and Tomato   Review of Systems Review of Systems  HENT: Positive for ear pain.      Physical Exam Triage Vital Signs ED Triage Vitals  Enc Vitals Group     BP 07/27/18 1540 127/80     Pulse Rate 07/27/18 1540 83     Resp 07/27/18 1540 16     Temp 07/27/18 1540 98.4 F (36.9 C)      Temp Source 07/27/18 1540 Oral     SpO2 07/27/18 1540 98 %     Weight 07/27/18 1539 280 lb (127 kg)     Height 07/27/18 1539 5\' 5"  (1.651 m)     Head Circumference --      Peak Flow --      Pain Score 07/27/18 1539 6     Pain Loc --      Pain Edu? --      Excl. in GC? --    No data found.  Updated Vital Signs BP 127/80 (BP Location: Right Arm)   Pulse 83   Temp 98.4 F (36.9 C) (Oral)   Resp 16   Ht 5\' 5"  (1.651 m)   Wt 280 lb (127 kg)   LMP 06/27/2018 (Approximate)   SpO2 98%   BMI 46.59 kg/m   Visual Acuity Right Eye Distance:   Left Eye Distance:   Bilateral Distance:    Right Eye Near:   Left Eye Near:    Bilateral Near:     Physical Exam Vitals signs and nursing note reviewed.  Constitutional:      General: She is not in acute distress.    Appearance: Normal appearance. She is not ill-appearing, toxic-appearing or diaphoretic.  HENT:     Head: Normocephalic and atraumatic.     Right Ear: Decreased hearing noted. A middle ear effusion is present. Tympanic membrane is erythematous and bulging.     Left Ear: Tympanic membrane and ear canal normal.     Nose: Nose normal.     Mouth/Throat:     Pharynx: Posterior oropharyngeal erythema present.  Eyes:     Conjunctiva/sclera: Conjunctivae normal.  Neck:     Musculoskeletal: Normal range of motion.  Cardiovascular:     Rate and Rhythm: Normal rate and regular rhythm.     Pulses: Normal pulses.     Heart sounds: Normal heart sounds.  Pulmonary:     Effort: Pulmonary effort is normal.     Breath sounds: Normal breath sounds.  Musculoskeletal: Normal range of motion.  Skin:    General: Skin is warm and dry.     Findings: No rash.  Neurological:     Mental Status: She is alert.  Psychiatric:        Mood and Affect: Mood normal.      UC Treatments / Results  Labs (all labs ordered are listed, but only  abnormal results are displayed) Labs Reviewed - No data to display  EKG None  Radiology No  results found.  Procedures Procedures (including critical care time)  Medications Ordered in UC Medications - No data to display  Initial Impression / Assessment and Plan / UC Course  I have reviewed the triage vital signs and the nursing notes.  Pertinent labs & imaging results that were available during my care of the patient were reviewed by me and considered in my medical decision making (see chart for details).     Symptoms consistent with right otitis media and middle ear effusion Treating with amoxicillin 3 times a day for 7 days.  Patient reporting she has had amoxicillin in the past without any problems.  She is only allergic to penicillin Instructed that if she gets any severe reaction she needs to go straight to the hospital. Flonase and Zyrtec for allergy type symptoms, postnasal drip, middle ear effusion and congestion. Rapid strep test negative Follow up as needed for continued or worsening symptoms  Final Clinical Impressions(s) / UC Diagnoses   Final diagnoses:  Non-recurrent acute serous otitis media of right ear     Discharge Instructions     We are treating you for an ear infection I would like for you to start taking a daily allergy pill and Flonase nasal spray Follow up as needed for continued or worsening symptoms     ED Prescriptions    Medication Sig Dispense Auth. Provider   fluticasone (FLONASE) 50 MCG/ACT nasal spray Place 1 spray into both nostrils daily. 16 g Vona Whiters A, NP   amoxicillin (AMOXIL) 500 MG capsule Take 2 capsules (1,000 mg total) by mouth 3 (three) times daily for 7 days. 40 capsule Dahlia Byes A, NP     Controlled Substance Prescriptions [place] Controlled Substance Registry consulted? Not Applicable   Janace Aris, NP 07/27/18 [phone]

## 2018-07-27 NOTE — ED Triage Notes (Signed)
Per pt has been having a right ear ache for 1 week now and has gotten mouth sores that  Came up this past Sunday that are sore. No fevers, no chills

## 2018-07-27 NOTE — Discharge Instructions (Signed)
We are treating you for an ear infection I would like for you to start taking a daily allergy pill and Flonase nasal spray Follow up as needed for continued or worsening symptoms

## 2018-10-20 ENCOUNTER — Ambulatory Visit (HOSPITAL_COMMUNITY)
Admission: EM | Admit: 2018-10-20 | Discharge: 2018-10-20 | Disposition: A | Discharge status: Home / Self Care | Payer: Medicaid Other | Attending: Physician Assistant | Admitting: Physician Assistant

## 2018-10-20 ENCOUNTER — Other Ambulatory Visit: Payer: Self-pay

## 2018-10-20 ENCOUNTER — Encounter (HOSPITAL_COMMUNITY): Payer: Self-pay | Admitting: Physician Assistant

## 2018-10-20 DIAGNOSIS — M7661 Achilles tendinitis, right leg: Secondary | ICD-10-CM

## 2018-10-20 DIAGNOSIS — M7662 Achilles tendinitis, left leg: Secondary | ICD-10-CM

## 2018-10-20 MED ORDER — DICLOFENAC SODIUM 1 % TD GEL: 2.0000 g | Freq: Four times a day (QID) | TRANSDERMAL | 0 refills | Status: DC

## 2018-10-20 MED ORDER — PREDNISONE 50 MG PO TABS: 50.0000 mg | ORAL_TABLET | Freq: Every day | ORAL | 0 refills | Status: DC

## 2018-10-20 NOTE — Discharge Instructions (Signed)
Start prednisone as directed. You can also start voltaren for further pain relief. Ice compress, elevation. Wear supportive shoes during work. Follow up with PCP for further evaluation if symptoms not improving.

## 2018-10-20 NOTE — ED Triage Notes (Signed)
Per pt she has been having ankle pain since January but swelling started yesterday. Pt says hard top stand on her feet.

## 2018-10-20 NOTE — ED Provider Notes (Signed)
[place]    CSN: [id number] Arrival date & time: 10/20/18  1145      History   Chief Complaint Chief Complaint  Patient presents with  . Ankle Pain    swelling/both    HPI Karen Simon is a 28 y.o. female.   28 year old female comes in for few month history of bilateral ankle pain, 1 day history of ankle swelling.  Patient states since January 2020, has had bilateral ankle pain that is constant, though waxes and wanes in intensity.  Pain is to the posterior aspect, worse with climbing stairs.  Work requires long hours of standing and walking, states has been out of work since March, and restarted yesterday.  This is when she noticed ankle swelling bilaterally that resolved overnight.  She denies radiation of pain, numbness, tingling.  Denies injury/trauma.  She has been taking ibuprofen sparingly as she has had a history of stomach ulcers caused by NSAIDs.  She has not taken anything else for the symptoms.  She states that work does not allow sneakers, and unable to wear supportive shoes to work.      Past Medical History:  Diagnosis Date  . Anemia   . Asthma   . Enlarged heart   . H/O candidiasis   . H/O varicella   . Irregular periods/menstrual cycles 07/01/09  . Migraines   . Morbid obesity ([place]) 09/17/2015    Patient Active Problem List   Diagnosis Date Noted  . Morbid obesity ([place]) 09/17/2015  . Normal labor 02/23/2014  . Lactating mother 11/27/2011  . Teen pregnancy 11/27/2011  . Pregnant state, incidental 11/25/2011  . Vaginal delivery 11/25/2011  . Perineal laceration complicating delivery 86/76/1948  . Candida albicans infection 11/15/2011  . Excess weight gain in pregnancy 11/07/2011  . Anemia in pregnancy 09/14/2011  . History of high blood pressure - never took meds 04/12/2011  . 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 History of anemia 11/26/2010  . Asthma 11/26/2010  . Migraine 11/26/2010    Past Surgical  History:  Procedure Laterality Date  . CHOLECYSTECTOMY  03/20/2012   Procedure: LAPAROSCOPIC CHOLECYSTECTOMY;  Surgeon: Harl Bowie, MD;  Location: [facility];  Service: General;  Laterality: N/A;  . CHOLECYSTECTOMY, LAPAROSCOPIC N/A 2013    OB History    Gravida  2   Para  2   Term  2   Preterm  0   AB  0   Living  2     SAB  0   TAB  0   Ectopic  0   Multiple  0   Live Births  2            Home Medications    Prior to Admission medications   Medication Sig Start Date End Date Taking? Authorizing Provider  albuterol (PROVENTIL HFA;VENTOLIN HFA) 108 (90 Base) MCG/ACT inhaler Inhale 1-2 puffs into the lungs every 6 (six) hours as needed for wheezing or shortness of breath. 07/04/18   Jerilee Hoh, Ana P, PA-C  benzonatate (TESSALON) 100 MG capsule Take 1 capsule (100 mg total) by mouth every 8 (eight) hours. 07/04/18   Darlin Drop P, PA-C  diclofenac sodium (VOLTAREN) 1 % GEL Apply 2 g topically 4 (four) times daily. 10/20/18   Tasia Catchings, Amy V, PA-C  fluticasone (FLONASE) 50 MCG/ACT nasal spray Place 1 spray into both nostrils daily. 07/27/18   Loura Halt A, NP  ipratropium (ATROVENT) 0.06 % nasal spray Place 2 sprays into  both nostrils 4 (four) times daily. 05/07/18   Belinda FisherYu, Amy V, PA-C  oseltamivir (TAMIFLU) 75 MG capsule Take 1 capsule (75 mg total) by mouth every 12 (twelve) hours. 07/04/18   Carlyle BasquesHernandez, Ana P, PA-C  predniSONE (DELTASONE) 50 MG tablet Take 1 tablet (50 mg total) by mouth daily. 10/20/18   Cathie HoopsYu, Amy V, PA-C  pseudoephedrine (SUDAFED) 30 MG tablet Take 30 mg by mouth every 4 (four) hours as needed for congestion.    [provider]    Family History Family History  Problem Relation Age of Onset  . Hypertension Mother   . Cancer Maternal Grandmother        breast  . Cancer Maternal Grandfather        colon  . Anesthesia problems Neg Hx     Social History Social History   Tobacco Use  . Smoking status: Never Smoker  . Smokeless tobacco:  Never Used  Substance Use Topics  . Alcohol use: Yes    Comment: occassionally   . Drug use: No     Allergies   Penicillins, Iodine, Latex, Shellfish allergy, and Tomato   Review of Systems Review of Systems  Reason unable to perform ROS: See HPI as above.     Physical Exam Triage Vital Signs ED Triage Vitals  Enc Vitals Group     BP 10/20/18 1240 124/84     Pulse Rate 10/20/18 1240 79     Resp 10/20/18 1240 16     Temp 10/20/18 1240 98 F (36.7 C)     Temp Source 10/20/18 1240 Oral     SpO2 10/20/18 1240 99 %     Weight --      Height --      Head Circumference --      Peak Flow --      Pain Score 10/20/18 1241 8     Pain Loc --      Pain Edu? --      Excl. in GC? --    No data found.  Updated Vital Signs BP 124/84 (BP Location: Right Arm)   Pulse 79   Temp 98 F (36.7 C) (Oral)   Resp 16   LMP 10/08/2018   SpO2 99%   Physical Exam Constitutional:      General: She is not in acute distress.    Appearance: She is well-developed. She is not diaphoretic.  HENT:     Head: Normocephalic and atraumatic.  Eyes:     Conjunctiva/sclera: Conjunctivae normal.     Pupils: Pupils are equal, round, and reactive to light.  Musculoskeletal:     Comments: No swelling, erythema, warmth, contusion seen.  Tenderness to palpation bilaterally along the Achilles tendon.  No tenderness to palpation of bilateral malleolus, foot.  Full range of motion of ankle and toes.  Strength normal and equal bilaterally.  Sensation intact and equal bilaterally.  Pedal pulse 2+, cap refill less than 2 seconds.  Neurological:     Mental Status: She is alert and oriented to person, place, and time.      UC Treatments / Results  Labs (all labs ordered are listed, but only abnormal results are displayed) Labs Reviewed - No data to display  EKG None  Radiology No results found.  Procedures Procedures (including critical care time)  Medications Ordered in UC Medications - No data  to display  Initial Impression / Assessment and Plan / UC Course  I have reviewed the triage vital signs and  the nursing notes.  Pertinent labs & imaging results that were available during my care of the patient were reviewed by me and considered in my medical decision making (see chart for details).    Start prednisone as directed.  Voltaren gel as needed.  Ice compress, rest, elevation.  Discussed stretching exercises to help with Achilles tendinitis.  Return precautions given.  Patient expresses understanding and agrees to plan.  Final Clinical Impressions(s) / UC Diagnoses   Final diagnoses:  Achilles tendinitis of both lower extremities    ED Prescriptions    Medication Sig Dispense Auth. Provider   predniSONE (DELTASONE) 50 MG tablet Take 1 tablet (50 mg total) by mouth daily. 5 tablet Yu, Amy V, PA-C   diclofenac sodium (VOLTAREN) 1 % GEL Apply 2 g topically 4 (four) times daily. 1 Tube Threasa AlphaYu, Amy V, PA-C        Yu, Amy V, [place] 10/20/18 1307

## 2019-04-01 ENCOUNTER — Other Ambulatory Visit: Payer: Self-pay

## 2019-04-01 ENCOUNTER — Encounter (HOSPITAL_BASED_OUTPATIENT_CLINIC_OR_DEPARTMENT_OTHER): Payer: Self-pay | Admitting: *Deleted

## 2019-04-01 ENCOUNTER — Emergency Department (HOSPITAL_BASED_OUTPATIENT_CLINIC_OR_DEPARTMENT_OTHER)
Admission: EM | Admit: 2019-04-01 | Discharge: 2019-04-01 | Disposition: A | Discharge status: Home / Self Care | Payer: Medicaid Other | Attending: Emergency Medicine | Admitting: Emergency Medicine

## 2019-04-01 ENCOUNTER — Emergency Department (HOSPITAL_BASED_OUTPATIENT_CLINIC_OR_DEPARTMENT_OTHER): Payer: Medicaid Other

## 2019-04-01 DIAGNOSIS — Z79899 Other long term (current) drug therapy: Secondary | ICD-10-CM | POA: Insufficient documentation

## 2019-04-01 DIAGNOSIS — R410 Disorientation, unspecified: Secondary | ICD-10-CM

## 2019-04-01 DIAGNOSIS — J45909 Unspecified asthma, uncomplicated: Secondary | ICD-10-CM | POA: Insufficient documentation

## 2019-04-01 DIAGNOSIS — R404 Transient alteration of awareness: Secondary | ICD-10-CM

## 2019-04-01 LAB — CBC WITH DIFFERENTIAL/PLATELET
Abs Immature Granulocytes: 0.02 10*3/uL (ref 0.00–0.07)
Basophils Absolute: 0 10*3/uL (ref 0.0–0.1)
Basophils Relative: 0 %
Eosinophils Absolute: 0.2 10*3/uL (ref 0.0–0.5)
Eosinophils Relative: 2 %
HCT: 37.3 % (ref 36.0–46.0)
Hemoglobin: 11.3 g/dL — ABNORMAL LOW (ref 12.0–15.0)
Immature Granulocytes: 0 %
Lymphocytes Relative: 33 %
Lymphs Abs: 2.9 10*3/uL (ref 0.7–4.0)
MCH: 25.3 pg — ABNORMAL LOW (ref 26.0–34.0)
MCHC: 30.3 g/dL (ref 30.0–36.0)
MCV: 83.6 fL (ref 80.0–100.0)
Monocytes Absolute: 0.4 10*3/uL (ref 0.1–1.0)
Monocytes Relative: 5 %
Neutro Abs: 5.3 10*3/uL (ref 1.7–7.7)
Neutrophils Relative %: 60 %
Platelets: 300 10*3/uL (ref 150–400)
RBC: 4.46 MIL/uL (ref 3.87–5.11)
RDW: 13.8 % (ref 11.5–15.5)
WBC: 8.8 10*3/uL (ref 4.0–10.5)
nRBC: 0 % (ref 0.0–0.2)

## 2019-04-01 LAB — COMPREHENSIVE METABOLIC PANEL
ALT: 16 U/L (ref 0–44)
AST: 13 U/L — ABNORMAL LOW (ref 15–41)
Albumin: 3.8 g/dL (ref 3.5–5.0)
Alkaline Phosphatase: 76 U/L (ref 38–126)
Anion gap: 7 (ref 5–15)
BUN: 14 mg/dL (ref 6–20)
CO2: 26 mmol/L (ref 22–32)
Calcium: 8.8 mg/dL — ABNORMAL LOW (ref 8.9–10.3)
Chloride: 104 mmol/L (ref 98–111)
Creatinine, Ser: 0.9 mg/dL (ref 0.44–1.00)
GFR calc Af Amer: >60 mL/min (ref >60)
GFR calc non Af Amer: >60 mL/min (ref >60)
Glucose, Bld: 91 mg/dL (ref 70–99)
Potassium: 4.2 mmol/L (ref 3.5–5.1)
Sodium: 137 mmol/L (ref 135–145)
Total Bilirubin: 0.3 mg/dL (ref 0.3–1.2)
Total Protein: 7.6 g/dL (ref 6.5–8.1)

## 2019-04-01 LAB — RAPID URINE DRUG SCREEN, HOSP PERFORMED
Amphetamines: NOT DETECTED
Barbiturates: NOT DETECTED
Benzodiazepines: NOT DETECTED
Cocaine: NOT DETECTED
Opiates: NOT DETECTED
Tetrahydrocannabinol: POSITIVE — AB

## 2019-04-01 LAB — URINALYSIS, ROUTINE W REFLEX MICROSCOPIC
Bilirubin Urine: NEGATIVE
Glucose, UA: NEGATIVE mg/dL
Hgb urine dipstick: NEGATIVE
Ketones, ur: NEGATIVE mg/dL
Leukocytes,Ua: NEGATIVE
Nitrite: NEGATIVE
Protein, ur: NEGATIVE mg/dL
Specific Gravity, Urine: 1.025 (ref 1.005–1.030)
pH: 6.5 (ref 5.0–8.0)

## 2019-04-01 LAB — PREGNANCY, URINE: Preg Test, Ur: NEGATIVE

## 2019-04-01 LAB — CBG MONITORING, ED: Glucose-Capillary: 78 mg/dL (ref 70–99)

## 2019-04-01 IMAGING — CT CT HEAD W/O CM
3 series · 15 of 47 positions shown, 18 images · non-contrast
Comparison: None.

CLINICAL DATA: Confusion

EXAM:
CT HEAD WITHOUT CONTRAST
TECHNIQUE: Contiguous axial images were obtained from the base of the skull
through the vertex without intravenous contrast.

[Series 2: head wo · axial · 0.45mm/px · z∈[+1028,+1152]mm · 9 of 30 slices shown, 12 images]
[im 3/30  brain]
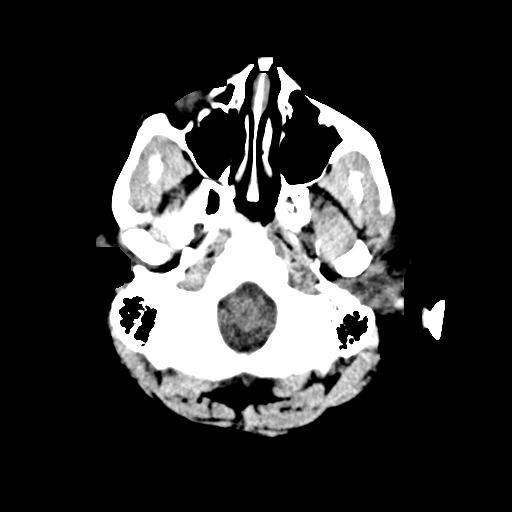
[im 3/30  bone]
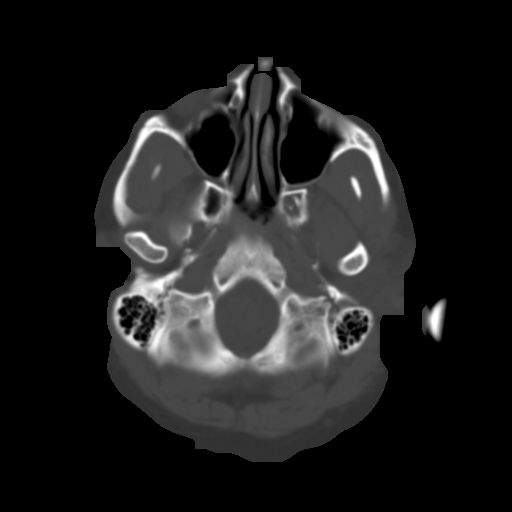
[im 6/30  brain]
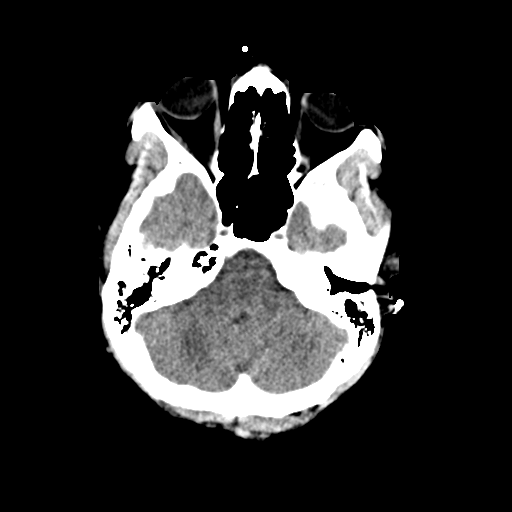
[im 9/30  brain]
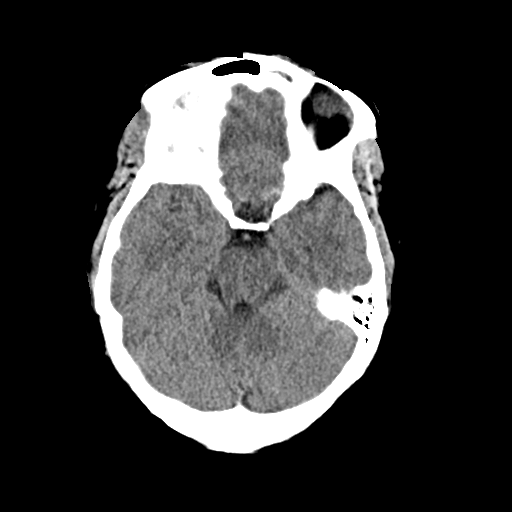
[im 12/30  brain]
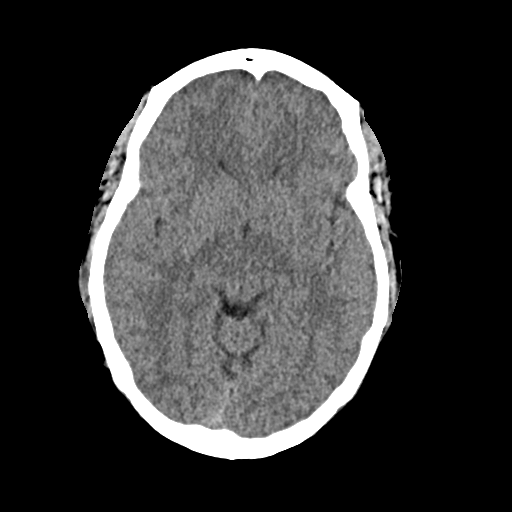
[im 16/30  brain]
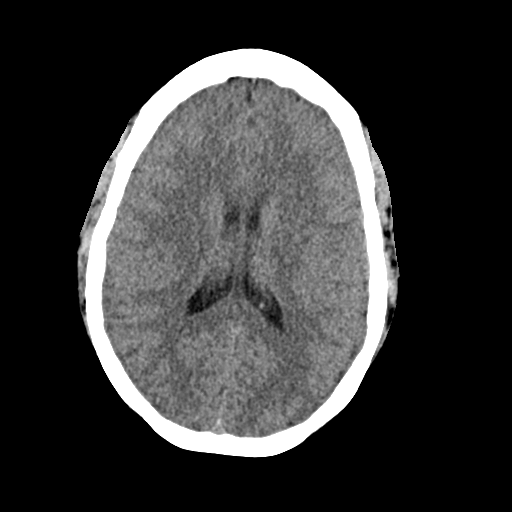
[im 16/30  bone]
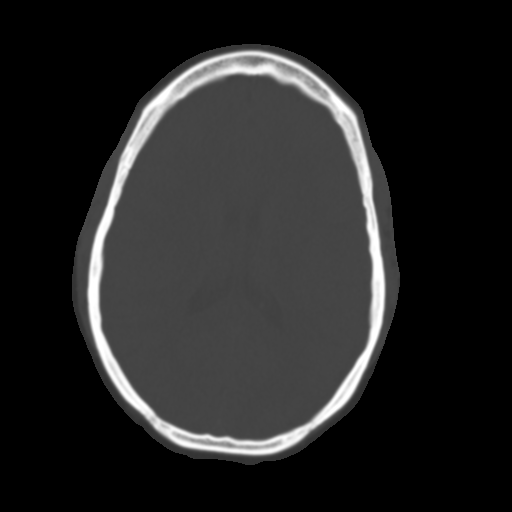
[im 19/30  brain]
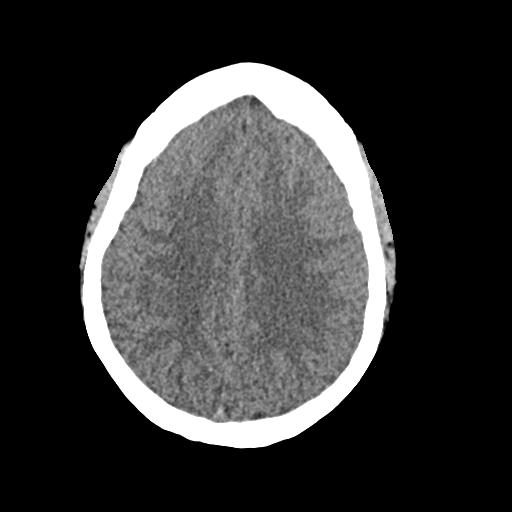
[im 22/30  brain]
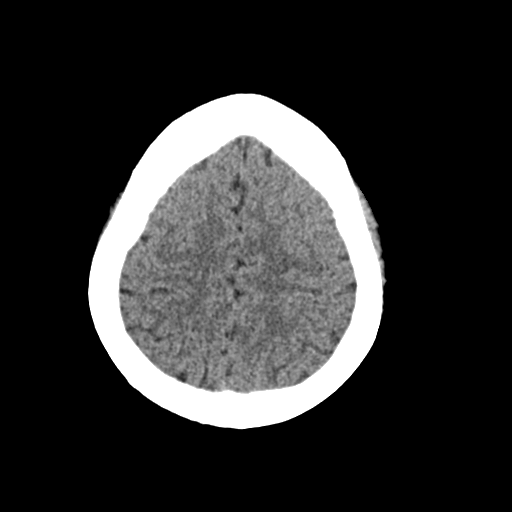
[im 25/30  brain]
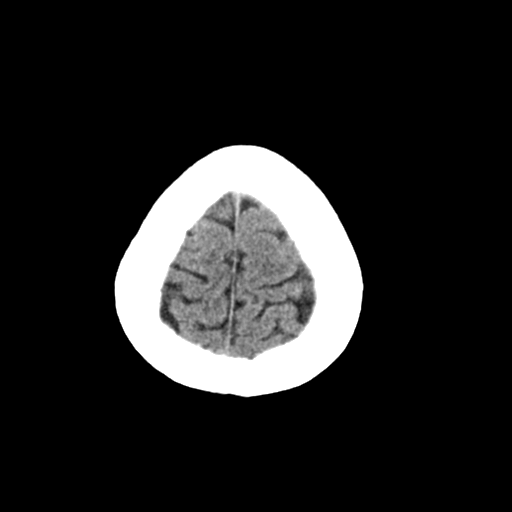
[im 28/30  brain]
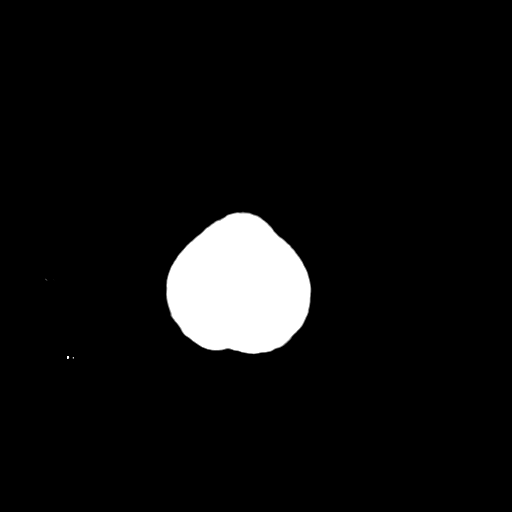
[im 28/30  bone]
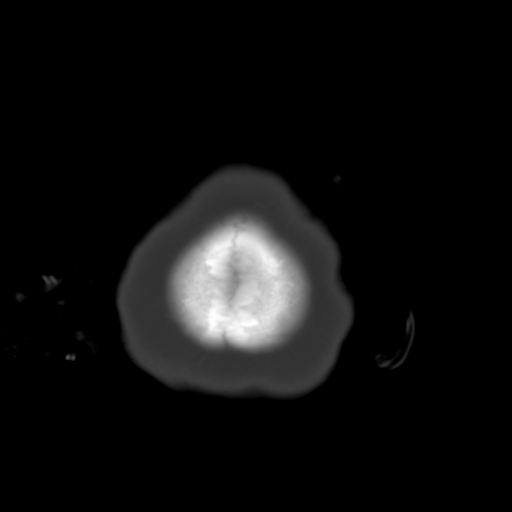

[Series 4: coronal soft · coronal · 0.31mm/px · 3 of 72 slices shown]
[im 24/72  brain]
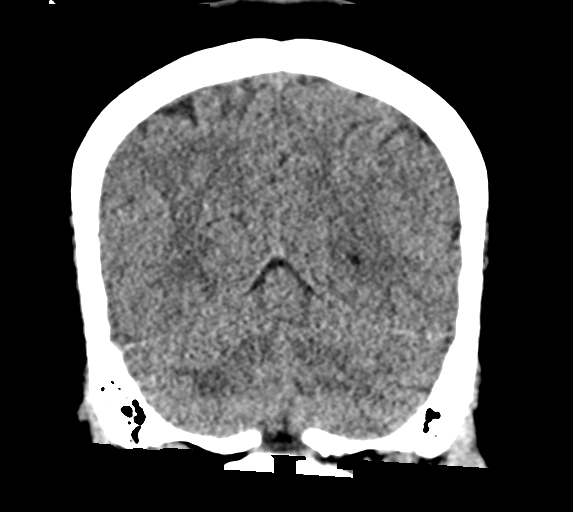
[im 32/72  brain]
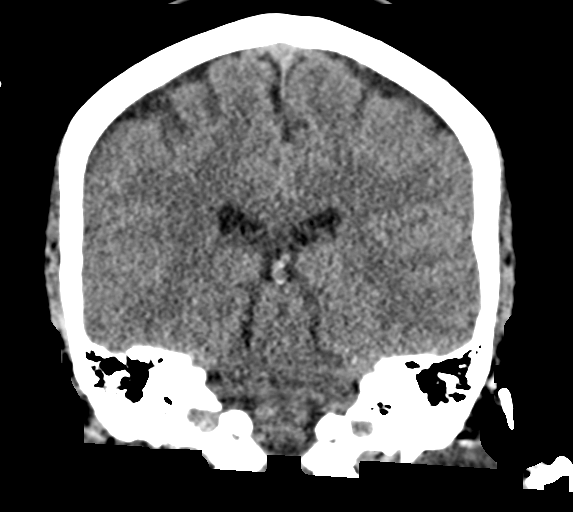
[im 40/72  brain]
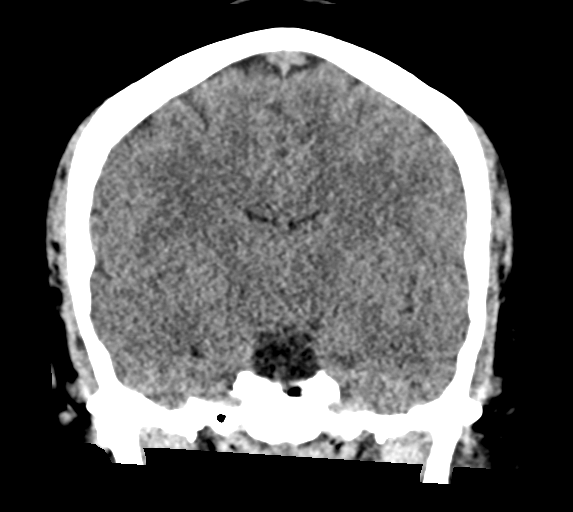

[Series 5: sag soft · sagittal · 0.29mm/px · 3 of 60 slices shown]
[im 20/60  brain]
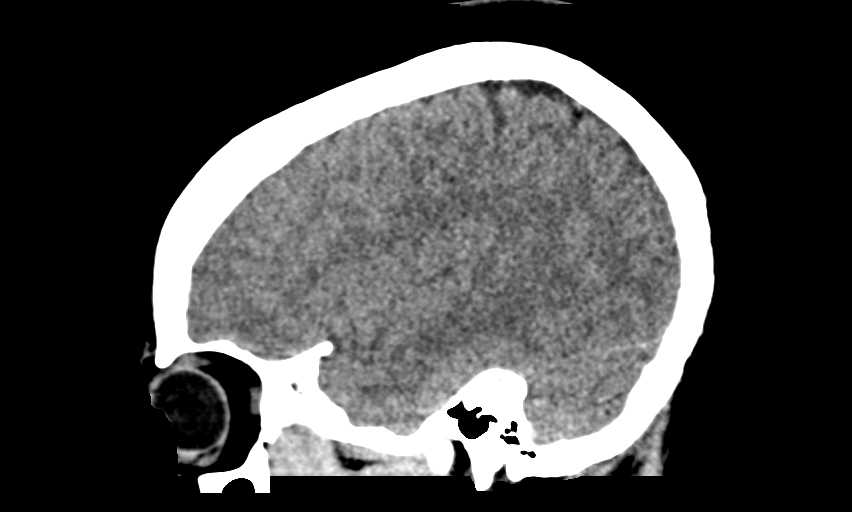
[im 30/60  brain]
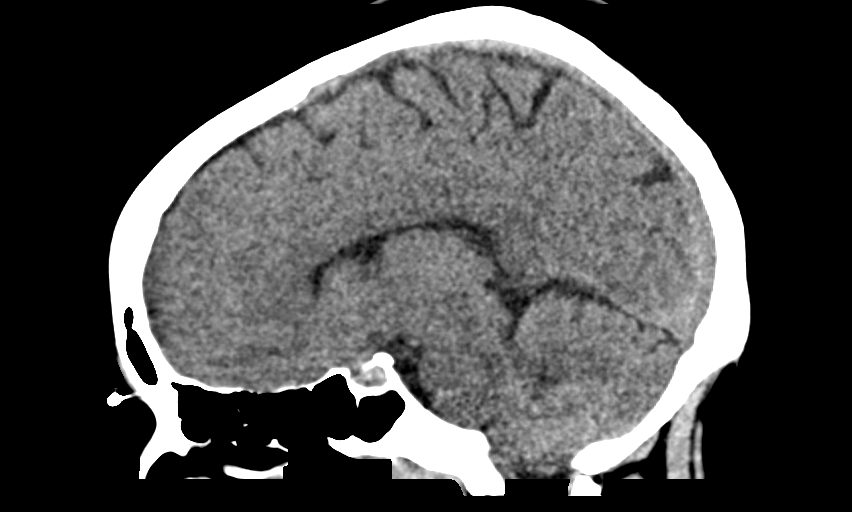
[im 40/60  brain]
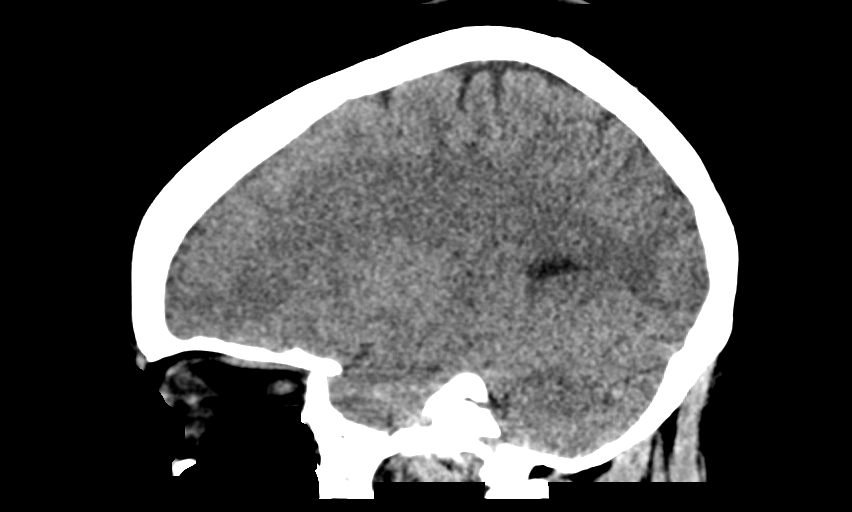

[15 of 47 positions shown; findings below may reference images not displayed]

FINDINGS: Brain: No evidence of acute infarction, hemorrhage, hydrocephalus,
extra-axial collection or mass lesion/mass effect.

Vascular: No hyperdense vessel or unexpected calcification.

Skull: Normal. Negative for fracture or focal lesion.

Sinuses/Orbits: No acute finding.

Other: None.
IMPRESSION: Normal CT head without contrast.

## 2019-04-01 NOTE — ED Provider Notes (Signed)
[facility] EMERGENCY DEPARTMENT Provider Note   CSN: [id number] Arrival date & time: 04/01/19  1748     History   Chief Complaint Chief Complaint  Patient presents with  . Altered Mental Status    HPI FLOETTA BRICKEY is a 28 y.o. female.     The history is provided by the patient and medical records. No language interpreter was used.  Neurologic Problem This is a new problem. The current episode started yesterday. The problem occurs rarely. The problem has been resolved. Associated symptoms include headaches (chronic occasional). Pertinent negatives include no chest pain, no abdominal pain and no shortness of breath. Nothing aggravates the symptoms. Nothing relieves the symptoms. She has tried nothing for the symptoms. The treatment provided no relief.    Past Medical History:  Diagnosis Date  . Anemia   . Asthma   . Enlarged heart   . H/O candidiasis   . H/O varicella   . Irregular periods/menstrual cycles 07/01/09  . Migraines   . Morbid obesity ([facility]) 09/17/2015    Patient Active Problem List   Diagnosis Date Noted  . Morbid obesity ([facility]) 09/17/2015  . Normal labor 02/23/2014  . Lactating mother 11/27/2011  . Teen pregnancy 11/27/2011  . Pregnant state, incidental 11/25/2011  . Vaginal delivery 11/25/2011  . Perineal laceration complicating delivery 11/25/2011  . Candida albicans infection 11/15/2011  . Excess weight gain in pregnancy 11/07/2011  . Anemia in pregnancy 09/14/2011  . History of high blood pressure - never took meds 04/12/2011  . 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 History of anemia 11/26/2010  . Asthma 11/26/2010  . Migraine 11/26/2010    Past Surgical History:  Procedure Laterality Date  . CHOLECYSTECTOMY  03/20/2012   Procedure: LAPAROSCOPIC CHOLECYSTECTOMY;  Surgeon: Shelly Rubenstein, MD;  Location: [facility];  Service: General;  Laterality: N/A;  . CHOLECYSTECTOMY, LAPAROSCOPIC N/A 2013     OB  History    Gravida  2   Para  2   Term  2   Preterm  0   AB  0   Living  2     SAB  0   TAB  0   Ectopic  0   Multiple  0   Live Births  2            Home Medications    Prior to Admission medications   Medication Sig Start Date End Date Taking? Authorizing Provider  albuterol (PROVENTIL HFA;VENTOLIN HFA) 108 (90 Base) MCG/ACT inhaler Inhale 1-2 puffs into the lungs every 6 (six) hours as needed for wheezing or shortness of breath. 07/04/18   Ardyth Harps, Ana P, PA-C  benzonatate (TESSALON) 100 MG capsule Take 1 capsule (100 mg total) by mouth every 8 (eight) hours. 07/04/18   Carlyle Basques P, PA-C  diclofenac sodium (VOLTAREN) 1 % GEL Apply 2 g topically 4 (four) times daily. 10/20/18   Cathie Hoops, Amy V, PA-C  fluticasone (FLONASE) 50 MCG/ACT nasal spray Place 1 spray into both nostrils daily. 07/27/18   Bast, Gloris Manchester A, NP  ipratropium (ATROVENT) 0.06 % nasal spray Place 2 sprays into both nostrils 4 (four) times daily. 05/07/18   Belinda Fisher, PA-C  oseltamivir (TAMIFLU) 75 MG capsule Take 1 capsule (75 mg total) by mouth every 12 (twelve) hours. 07/04/18   Carlyle Basques P, PA-C  predniSONE (DELTASONE) 50 MG tablet Take 1 tablet (50 mg total) by mouth daily. 10/20/18   Belinda Fisher, PA-C  pseudoephedrine (SUDAFED) 30 MG tablet Take 30 mg by mouth every 4 (four) hours as needed for congestion.    [provider]    Family History Family History  Problem Relation Age of Onset  . Hypertension Mother   . Cancer Maternal Grandmother        breast  . Cancer Maternal Grandfather        colon  . Anesthesia problems Neg Hx     Social History Social History   Tobacco Use  . Smoking status: Never Smoker  . Smokeless tobacco: Never Used  Substance Use Topics  . Alcohol use: Yes    Comment: occassionally   . Drug use: No     Allergies   Penicillins, Iodine, Latex, Shellfish allergy, and Tomato   Review of Systems Review of Systems  Constitutional: Negative for  chills, diaphoresis, fatigue and fever.  HENT: Negative for congestion and rhinorrhea.   Eyes: Negative for photophobia and visual disturbance.  Respiratory: Negative for cough, chest tightness, shortness of breath and wheezing.   Cardiovascular: Negative for chest pain, palpitations and leg swelling.  Gastrointestinal: Negative for abdominal pain, constipation, diarrhea, nausea and vomiting.  Genitourinary: Negative for dysuria and flank pain.  Musculoskeletal: Negative for back pain, neck pain and neck stiffness.  Skin: Negative for rash and wound.  Neurological: Positive for headaches (chronic occasional). Negative for dizziness, seizures, syncope, facial asymmetry, speech difficulty, weakness and light-headedness.  Psychiatric/Behavioral: Positive for confusion. Negative for agitation.  All other systems reviewed and are negative.    Physical Exam Updated Vital Signs BP 128/65 (BP Location: Right Arm)   Pulse 69   Temp 98.2 F (36.8 C) (Oral)   Resp 18   Ht 5\' 5"  (1.651 m)   Wt 131.5 kg   LMP 03/30/2019   SpO2 100%   BMI 48.26 kg/m   Physical Exam Vitals signs and nursing note reviewed.  Constitutional:      General: She is not in acute distress.    Appearance: She is well-developed. She is obese. She is not ill-appearing, toxic-appearing or diaphoretic.  HENT:     Head: Normocephalic and atraumatic.     Right Ear: External ear normal.     Left Ear: External ear normal.     Nose: Nose normal. No congestion or rhinorrhea.     Mouth/Throat:     Mouth: Mucous membranes are moist.     Pharynx: No oropharyngeal exudate or posterior oropharyngeal erythema.  Eyes:     Extraocular Movements: Extraocular movements intact.     Conjunctiva/sclera: Conjunctivae normal.     Pupils: Pupils are equal, round, and reactive to light.  Neck:     Musculoskeletal: Normal range of motion and neck supple. No muscular tenderness.  Cardiovascular:     Rate and Rhythm: Normal rate.      Pulses: Normal pulses.     Heart sounds: No murmur.  Pulmonary:     Effort: Pulmonary effort is normal. No respiratory distress.     Breath sounds: Normal breath sounds. No stridor. No wheezing, rhonchi or rales.  Chest:     Chest wall: No tenderness.  Abdominal:     General: Abdomen is flat. There is no distension.     Tenderness: There is no abdominal tenderness. There is no right CVA tenderness, left CVA tenderness or rebound.  Musculoskeletal:        General: No tenderness.     Right lower leg: No edema.     Left lower leg:  No edema.  Skin:    General: Skin is warm.     Findings: No erythema or rash.  Neurological:     General: No focal deficit present.     Mental Status: She is alert and oriented to person, place, and time.     Cranial Nerves: No cranial nerve deficit.     Sensory: No sensory deficit.     Motor: No weakness or abnormal muscle tone.     Coordination: Coordination normal.     Gait: Gait normal.     Deep Tendon Reflexes: Reflexes are normal and symmetric.  Psychiatric:        Mood and Affect: Mood normal.      ED Treatments / Results  Labs (all labs ordered are listed, but only abnormal results are displayed) Labs Reviewed  CBC WITH DIFFERENTIAL/PLATELET - Abnormal; Notable for the following components:      Result Value   Hemoglobin 11.3 (*)    MCH 25.3 (*)    All other components within normal limits  COMPREHENSIVE METABOLIC PANEL - Abnormal; Notable for the following components:   Calcium 8.8 (*)    AST 13 (*)    All other components within normal limits  RAPID URINE DRUG SCREEN, HOSP PERFORMED - Abnormal; Notable for the following components:   Tetrahydrocannabinol POSITIVE (*)    All other components within normal limits  URINALYSIS, ROUTINE W REFLEX MICROSCOPIC  PREGNANCY, URINE  CBG MONITORING, ED    EKG None  Radiology Ct Head Wo Contrast  Result Date: 04/01/2019 CLINICAL DATA:  Confusion EXAM: CT HEAD WITHOUT CONTRAST TECHNIQUE:  Contiguous axial images were obtained from the base of the skull through the vertex without intravenous contrast. COMPARISON:  None. FINDINGS: Brain: No evidence of acute infarction, hemorrhage, hydrocephalus, extra-axial collection or mass lesion/mass effect. Vascular: No hyperdense vessel or unexpected calcification. Skull: Normal. Negative for fracture or focal lesion. Sinuses/Orbits: No acute finding. Other: None. IMPRESSION: Normal CT head without contrast. Electronically Signed   By: Katherine Mantlehristopher  Green M.D.   On: 04/01/2019 19:01    Procedures Procedures (including critical care time)  Medications Ordered in ED Medications - No data to display   Initial Impression / Assessment and Plan / ED Course  I have reviewed the triage vital signs and the nursing notes.  Pertinent labs & imaging results that were available during my care of the patient were reviewed by me and considered in my medical decision making (see chart for details).        Arita MissDiamond P Markes is a 28 y.o. female with a past medical history significant for migraines, and hypertension who presents with episodes of altered mental status for the last 2 days.  According to patient, she has had several episodes since yesterday where she has been disoriented.  Last night, she was at [employer] and was confused, walked out into the parking lot, and was staring off for several seconds.  She reports she did not know how she got there.  She has never had this before.  She reports approximately 3 or 4 episodes today where she has had episodes of confusion.  They all were very brief and she did not have prolonged abnormal.  Following.  She denies recent head trauma.  She has occasional headaches but no specific headache today.  No neck pain or neck stiffness.  No fevers, chills, ingestion cough, shortness of breath, chest pain, palpitations, nausea, vomiting, urinary symptoms or GI symptoms.  No recent visual changes.  She has not had  any recent drug use, medication use, or change in alcohol use.  She does report a family history of seizures but has never had one herself.  She denies other complaints on arrival.  On exam, lungs are clear and chest is nontender.  Abdomen is nontender.  No focal neurologic deficits including normal gait, sensation, strength in all extremities.  Normal coordination.  Clear speech.  No facial droop.  Normal extraocular movements.  Patient very well-appearing on my exam.  Patient had work-up including labs and CT imaging.  CT head was normal with no evidence of stroke, mass-effect, or bleed.  Given lack of neck pain or neck stiffness, low suspicion for meningitis causing episodes.  Labs were reassuring.  Low suspicion for stroke or TIA causing her symptoms At this time.  Clinically I suspect patient may have new seizures or epilepsy versus idiopathic transient altered mental status.  Given her stability for over 4 hours with no episodes, and her reassuring work-up, we feel she is safe for discharge home with plans to follow-up with outpatient neurology for likely EEG.  She was given instructions on seizure precautions do not drive, swim, use ladders, or result in dangerous positions.  Was agreeable to this plan and agreed with going home.  We do not suspect she has infection or other new abnormality at this time.  Patient agreed with plan of care and other questions or concerns.  Patient discharged in good condition for outpatient neurology follow-up.   Final Clinical Impressions(s) / ED Diagnoses   Final diagnoses:  Transient alteration of awareness  Confusion    ED Discharge Orders    None      Clinical Impression: 1. Transient alteration of awareness   2. Confusion     Disposition: Discharge  Condition: Good  I have discussed the results, Dx and Tx plan with the pt(& family if present). He/she/they expressed understanding and agree(s) with the plan. Discharge instructions  discussed at great length. Strict return precautions discussed and pt &/or family have verbalized understanding of the instructions. No further questions at time of discharge.    New Prescriptions   No medications on file    Follow Up: [facility] [address]     [address] [place] [address] [phone] Schedule an appointment as soon as possible for a visit       Caiden Arteaga, Gwenyth Allegra, MD 04/01/19 2217

## 2019-04-01 NOTE — Discharge Instructions (Signed)
Your history and exam today are consistent with possible new onset of atypical seizures, especially given your family history.  We did a work-up including CT imaging of the head which was reassuring and labs.  We did not find any acute abnormalities which is likely contributed to these episodes.  Since you have been stable for over 4 hours with no further episodes and your exam was extremely reassuring, we feel you are safe for discharge home to follow-up with outpatient neurology for possible new seizures.  Please stay hydrated, rest, and get sleep.  Please avoid drugs caffeine, or other stimulants.  Please do not drive, swim, or go on ladders.  If any symptoms change or worsen, please return to the nearest emergency department daily.

## 2019-04-01 NOTE — ED Notes (Signed)
ED Provider at bedside. 

## 2019-04-01 NOTE — ED Notes (Signed)
Family at bedside. 

## 2019-04-01 NOTE — ED Triage Notes (Addendum)
Mother states confusion and starring x 2 days, pt alert  and oriented x 4 , pt states she is just sleepy.

## 2019-04-01 NOTE — ED Notes (Signed)
Patient transported to CT 

## 2019-04-11 ENCOUNTER — Other Ambulatory Visit: Payer: Self-pay

## 2019-04-11 ENCOUNTER — Ambulatory Visit: Payer: Self-pay | Admitting: Neurology

## 2019-04-11 ENCOUNTER — Encounter: Payer: Self-pay | Admitting: Neurology

## 2019-04-11 VITALS — BP 128/81 | HR 74 | Temp 97.3°F | Ht 65.0 in | Wt 300.5 lb

## 2019-04-11 DIAGNOSIS — G40209 Localization-related (focal) (partial) symptomatic epilepsy and epileptic syndromes with complex partial seizures, not intractable, without status epilepticus: Secondary | ICD-10-CM | POA: Insufficient documentation

## 2019-04-11 MED ORDER — LEVETIRACETAM 500 MG PO TABS: 500.0000 mg | ORAL_TABLET | Freq: Two times a day (BID) | ORAL | 11 refills | Status: DC

## 2019-04-11 NOTE — Progress Notes (Signed)
PATIENT: Karen Simon DOB: [birth year]  Chief Complaint  Patient presents with  . Confusion Spells    ED follow up from 04/01/2019.  Reports having 3-4 episodes, the same day, of confusion, disorientation and staring spells. Says her mother told her the last event lasted around 45 minutes and this is when she was taken to the hospital.  Normal CT head at hospital.  She is concerned because her father and three paternal aunts have a seizure disorder.  Marland Kitchen PCP    No established PCP.  Referred from ED.     HISTORICAL  Karen Simon is a 28 year old female, seen in request by emergency room for evaluation of confusion spells, initial evaluation was on April 11, 2019.  I have reviewed and summarized the referring note from the referring physician.  She was previously healthy, works at [employer], presented to emergency room on April 01, 2019 with weakness confusion spells  On March 31, 2019, while shopping at [employer], she suddenly had transient confusion, lasting for few seconds, there was no loss of consciousness, next day, while she was spending time with her family, she was noted to raise her right arm, finger fidgety movement, confusion, did not know where she was, mumbling, lasting 45 minutes, she is aware of the surroundings, but could not respond to that.  Her father,2 of paternal aunts have seizure, she has children at 54 and 55 years old, her 51-year-old daughter is going through sleep study for evaluation of confusion, waking up from sleep with fidgety movement.  I personally reviewed CT head without contrast April 01, 2019 that was normal  Laboratory evaluations showed positive UDS for THC, CMP showed normal creatinine 0.9, CBC with hemoglobin of 11.3   REVIEW OF SYSTEMS: Full 14 system review of systems performed and notable only for as above All other review of systems were negative.  ALLERGIES: Allergies  Allergen Reactions  . Penicillins  Anaphylaxis, Itching and Swelling  . Iodine Hives  . Latex Hives  . Shellfish Allergy Hives  . Tomato Hives and Swelling    HOME MEDICATIONS: Current Outpatient Medications  Medication Sig Dispense Refill  . Multiple Vitamin (MULTIVITAMIN) tablet Take 1 tablet by mouth daily.     No current facility-administered medications for this visit.     PAST MEDICAL HISTORY: Past Medical History:  Diagnosis Date  . Anemia   . Asthma   . Enlarged heart   . H/O candidiasis   . H/O varicella   . Irregular periods/menstrual cycles 07/01/09  . Migraines   . Morbid obesity ([place]) 09/17/2015  . Seizure-like activity ([place])     PAST SURGICAL HISTORY: Past Surgical History:  Procedure Laterality Date  . CHOLECYSTECTOMY  03/20/2012   Procedure: LAPAROSCOPIC CHOLECYSTECTOMY;  Surgeon: Harl Bowie, MD;  Location: [facility];  Service: General;  Laterality: N/A;    FAMILY HISTORY: Family History  Problem Relation Age of Onset  . Hypertension Mother   . Cancer Maternal Grandmother        breast  . Cancer Maternal Grandfather        colon  . Seizures Father   . Sleep apnea Father   . Seizures Paternal Aunt        x 3 aunts  . Anesthesia problems Neg Hx     SOCIAL HISTORY: Social History   Socioeconomic History  . Marital status: Married    Spouse name: Not on file  . Number of children: 2  . Years  of education: some college  . Highest education level: Not on file  Occupational History  . Occupation: Physicist, medical  . Occupation: Arts administrator and Nutrition  Social Needs  . Financial resource strain: Not on file  . Food insecurity    Worry: Not on file    Inability: Not on file  . Transportation needs    Medical: Not on file    Non-medical: Not on file  Tobacco Use  . Smoking status: Never Smoker  . Smokeless tobacco: Never Used  Substance and Sexual Activity  . Alcohol use: Never    Frequency: Never  . Drug use: No  . Sexual activity: Yes    Birth control/protection: None   Lifestyle  . Physical activity    Days per week: Not on file    Minutes per session: Not on file  . Stress: Not on file  Relationships  . Social Musician on phone: Not on file    Gets together: Not on file    Attends religious service: Not on file    Active member of club or organization: Not on file    Attends meetings of clubs or organizations: Not on file    Relationship status: Not on file  . Intimate partner violence    Fear of current or ex partner: Not on file    Emotionally abused: Not on file    Physically abused: Not on file    Forced sexual activity: Not on file  Other Topics Concern  . Not on file  Social History Narrative   Lives at home with her husband.   Right-handed.   2-4 cups caffeine per day.     PHYSICAL EXAM   Vitals:   04/11/19 1030  Temp: (!) 97.3 F (36.3 C)  Weight: (!) 300 lb 8 oz (136.3 kg)  Height: 5\' 5"  (1.651 m)    Not recorded      Body mass index is 50.01 kg/m.  PHYSICAL EXAMNIATION:  Gen: NAD, conversant, well nourised, well groomed                     Cardiovascular: Regular rate rhythm, no peripheral edema, warm, nontender. Eyes: Conjunctivae clear without exudates or hemorrhage Neck: Supple, no carotid bruits. Pulmonary: Clear to auscultation bilaterally   NEUROLOGICAL EXAM:  MENTAL STATUS: Speech:    Speech is normal; fluent and spontaneous with normal comprehension.  Cognition:     Orientation to time, place and person     Normal recent and remote memory     Normal Attention span and concentration     Normal Language, naming, repeating,spontaneous speech     Fund of knowledge   CRANIAL NERVES: CN II: Visual fields are full to confrontation.Pupils are round equal and briskly reactive to light. CN III, IV, VI: extraocular movement are normal. No ptosis. CN V: Facial sensation is intact to pinprick in all 3 divisions bilaterally. Corneal responses are intact.  CN VII: Face is symmetric with normal eye  closure and smile. CN VIII: Hearing is normal to causal conversation. CN IX, X: Palate elevates symmetrically. Phonation is normal. CN XI: Head turning and shoulder shrug are intact CN XII: Tongue is midline with normal movements and no atrophy.  MOTOR: There is no pronator drift of out-stretched arms. Muscle bulk and tone are normal. Muscle strength is normal.  REFLEXES: Reflexes are 2+ and symmetric at the biceps, triceps, knees, and ankles. Plantar responses are flexor.  SENSORY: Intact to  light touch, pinprick and vibratory sensation are intact in fingers and toes.  COORDINATION: There is no trunk or limb dysmetria noted.  GAIT/STANCE: Posture is normal. Gait is steady with normal steps, base, arm swing, and turning. Heel and toe walking are normal. Tandem gait is normal.  Romberg is absent.   DIAGNOSTIC DATA (LABS, IMAGING, TESTING) - I reviewed patient records, labs, notes, testing and imaging myself where available.   ASSESSMENT AND PLAN  Karen Simon is a 28 y.o. female   Probable complex partial seizure  Proceed with MRI of the brain with and without contrast  EEG  Keppra 500 mg twice daily  Her husband had vasectomy, she is not planning on having more children   Karen Simon, M.D. Ph.D.  [facility] [address], [address] [place], [place] [address] Ph: [phone]) [phone] Fax: [phone]  CC: Referring Provider

## 2019-05-06 ENCOUNTER — Other Ambulatory Visit: Payer: Self-pay

## 2019-05-06 ENCOUNTER — Ambulatory Visit: Payer: Self-pay | Admitting: Neurology

## 2019-05-06 DIAGNOSIS — G40209 Localization-related (focal) (partial) symptomatic epilepsy and epileptic syndromes with complex partial seizures, not intractable, without status epilepticus: Secondary | ICD-10-CM

## 2019-05-07 ENCOUNTER — Telehealth: Payer: Self-pay | Admitting: Neurology

## 2019-05-07 NOTE — Telephone Encounter (Signed)
Per vo by Dr. Krista Blue, there is no contraindication to getting the COVID-19 vaccination with her seizure diagnosis.  However, she is concerned about her history of having an anaphylaxis reaction to penicillin in the past.  She has been advised to work with her PCP to make sure it is okay for her to get the injection.  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

## 2019-05-07 NOTE — Telephone Encounter (Signed)
Pt called wanting to know if the provider approved of her getting the Covid Vaccine. Please advise.

## 2019-05-11 ENCOUNTER — Other Ambulatory Visit: Payer: Medicaid Other

## 2019-05-17 NOTE — Procedures (Signed)
   HISTORY: 29 year old female, presented with confusion spells  TECHNIQUE:  This is a routine 16 channel EEG recording with one channel devoted to a limited EKG recording.  It was performed during wakefulness, drowsiness and asleep.  Hyperventilation and photic stimulation were performed as activating procedures.  There are minimum muscle and movement artifact noted.  Upon maximum arousal, posterior dominant waking rhythm consistent of rhythmic alpha range activity, with frequency of 10 hz. Activities are symmetric over the bilateral posterior derivations and attenuated with eye opening.  Hyperventilation produced mild/moderate buildup with higher amplitude and the slower activities noted.  Photic stimulation did not alter the tracing.  During EEG recording, patient developed drowsiness and no deeper stage of sleep was achieved During EEG recording, there was no epileptiform discharge noted.  EKG demonstrate sinus rhythm, with heart rate of 60 bpm  CONCLUSION: This is a  normal awake EEG.  There is no electrodiagnostic evidence of epileptiform discharge.  Levert Feinstein, M.D. Ph.D.  [facility] [address] [place], [place] [address] Phone: [phone] Fax:      [phone]

## 2019-08-13 ENCOUNTER — Ambulatory Visit: Payer: Medicaid Other | Admitting: Neurology

## 2019-08-13 NOTE — Progress Notes (Deleted)
PATIENT: Karen Simon DOB: 17-Jun-1990  REASON FOR VISIT: follow up HISTORY FROM: patient  HISTORY OF PRESENT ILLNESS: Today 08/13/19  HISTORY 12/3/200 Dr. Krista Blue: Karen Simon is a 29 year old female, seen in request by emergency room for evaluation of confusion spells, initial evaluation was on April 11, 2019.  I have reviewed and summarized the referring note from the referring physician.  She was previously healthy, works at [employer], presented to emergency room on April 01, 2019 with weakness confusion spells  On March 31, 2019, while shopping at [employer], she suddenly had transient confusion, lasting for few seconds, there was no loss of consciousness, next day, while she was spending time with her family, she was noted to raise her right arm, finger fidgety movement, confusion, did not know where she was, mumbling, lasting 45 minutes, she is aware of the surroundings, but could not respond to that.  Her father,2 of paternal aunts have seizure, she has children at 29 and 50 years old, her 65-year-old daughter is going through sleep study for evaluation of confusion, waking up from sleep with fidgety movement.  I personally reviewed CT head without contrast April 01, 2019 that was normal  Laboratory evaluations showed positive UDS for THC, CMP showed normal creatinine 0.9, CBC with hemoglobin of 11.3   Update August 13, 2019 SS: EEG was normal, MRI of the brain has not been completed.  She remains on Keppra 500 mg twice a day  REVIEW OF SYSTEMS: Out of a complete 14 system review of symptoms, the patient complains only of the following symptoms, and all other reviewed systems are negative.  ALLERGIES: Allergies  Allergen Reactions  . Penicillins Anaphylaxis, Itching and Swelling  . Iodine Hives  . Latex Hives  . Shellfish Allergy Hives  . Tomato Hives and Swelling    HOME MEDICATIONS: Outpatient Medications Prior to Visit  Medication Sig  Dispense Refill  . levETIRAcetam (KEPPRA) 500 MG tablet Take 1 tablet (500 mg total) by mouth 2 (two) times daily. 60 tablet 11  . Multiple Vitamin (MULTIVITAMIN) tablet Take 1 tablet by mouth daily.     No facility-administered medications prior to visit.    PAST MEDICAL HISTORY: Past Medical History:  Diagnosis Date  . Anemia   . Asthma   . Enlarged heart   . H/O candidiasis   . H/O varicella   . Irregular periods/menstrual cycles 07/01/09  . Migraines   . Morbid obesity ([place]) 09/17/2015  . Seizure-like activity ([place])     PAST SURGICAL HISTORY: Past Surgical History:  Procedure Laterality Date  . CHOLECYSTECTOMY  03/20/2012   Procedure: LAPAROSCOPIC CHOLECYSTECTOMY;  Surgeon: Harl Bowie, MD;  Location: [facility];  Service: General;  Laterality: N/A;    FAMILY HISTORY: Family History  Problem Relation Age of Onset  . Hypertension Mother   . Cancer Maternal Grandmother        breast  . Cancer Maternal Grandfather        colon  . Seizures Father   . Sleep apnea Father   . Seizures Paternal Aunt        x 3 aunts  . Anesthesia problems Neg Hx     SOCIAL HISTORY: Social History   Socioeconomic History  . Marital status: Married    Spouse name: Not on file  . Number of children: 2  . Years of education: some college  . Highest education level: Not on file  Occupational History  . Occupation: Administrator, arts  .  Occupation: Food and Nutrition  Tobacco Use  . Smoking status: Never Smoker  . Smokeless tobacco: Never Used  Substance and Sexual Activity  . Alcohol use: Never  . Drug use: No  . Sexual activity: Yes    Birth control/protection: None  Other Topics Concern  . Not on file  Social History Narrative   Lives at home with her husband.   Right-handed.   2-4 cups caffeine per day.   Social Determinants of Health   Financial Resource Strain:   . Difficulty of Paying Living Expenses:   Food Insecurity:   . Worried About Programme researcher, broadcasting/film/video in the  Last Year:   . Barista in the Last Year:   Transportation Needs:   . Freight forwarder (Medical):   Marland Kitchen Lack of Transportation (Non-Medical):   Physical Activity:   . Days of Exercise per Week:   . Minutes of Exercise per Session:   Stress:   . Feeling of Stress :   Social Connections:   . Frequency of Communication with Friends and Family:   . Frequency of Social Gatherings with Friends and Family:   . Attends Religious Services:   . Active Member of Clubs or Organizations:   . Attends Banker Meetings:   Marland Kitchen Marital Status:   Intimate Partner Violence:   . Fear of Current or Ex-Partner:   . Emotionally Abused:   Marland Kitchen Physically Abused:   . Sexually Abused:       PHYSICAL EXAM  There were no vitals filed for this visit. There is no height or weight on file to calculate BMI.  Generalized: Well developed, in no acute distress   Neurological examination  Mentation: Alert oriented to time, place, history taking. Follows all commands speech and language fluent Cranial nerve II-XII: Pupils were equal round reactive to light. Extraocular movements were full, visual field were full on confrontational test. Facial sensation and strength were normal. Uvula tongue midline. Head turning and shoulder shrug  were normal and symmetric. Motor: The motor testing reveals 5 over 5 strength of all 4 extremities. Good symmetric motor tone is noted throughout.  Sensory: Sensory testing is intact to soft touch on all 4 extremities. No evidence of extinction is noted.  Coordination: Cerebellar testing reveals good finger-nose-finger and heel-to-shin bilaterally.  Gait and station: Gait is normal. Tandem gait is normal. Romberg is negative. No drift is seen.  Reflexes: Deep tendon reflexes are symmetric and normal bilaterally.   DIAGNOSTIC DATA (LABS, IMAGING, TESTING) - I reviewed patient records, labs, notes, testing and imaging myself where available.  Lab Results    Component Value Date   WBC 8.8 04/01/2019   HGB 11.3 (L) 04/01/2019   HCT 37.3 04/01/2019   MCV 83.6 04/01/2019   PLT 300 04/01/2019      Component Value Date/Time   NA 137 04/01/2019 1851   K 4.2 04/01/2019 1851   CL 104 04/01/2019 1851   CO2 26 04/01/2019 1851   GLUCOSE 91 04/01/2019 1851   BUN 14 04/01/2019 1851   CREATININE 0.90 04/01/2019 1851   CALCIUM 8.8 (L) 04/01/2019 1851   PROT 7.6 04/01/2019 1851   ALBUMIN 3.8 04/01/2019 1851   AST 13 (L) 04/01/2019 1851   ALT 16 04/01/2019 1851   ALKPHOS 76 04/01/2019 1851   BILITOT 0.3 04/01/2019 1851   GFRNONAA >60 04/01/2019 1851   GFRAA >60 04/01/2019 1851   Lab Results  Component Value Date   CHOL 179 09/21/2015  HDL 43 (L) 09/21/2015   LDLCALC 123 09/21/2015   TRIG 64 09/21/2015   CHOLHDL 4.2 09/21/2015   No results found for: HGBA1C No results found for: VITAMINB12 Lab Results  Component Value Date   TSH 1.41 11/26/2010      ASSESSMENT AND PLAN 29 y.o. year old female  has a past medical history of Anemia, Asthma, Enlarged heart, H/O candidiasis, H/O varicella, Irregular periods/menstrual cycles (07/01/09), Migraines, Morbid obesity ([facility]) (09/17/2015), and Seizure-like activity ([facility]). here with:  1.  Probable complex partial seizure -EEG was normal -MRI of the brain has not been completed   I spent 15 minutes with the patient. 50% of this time was spent   Margie Ege, [place], DNP 08/13/2019, 5:34 AM [facility] [address], [address] [place], [place] [address] [phone]

## 2019-08-14 NOTE — Progress Notes (Signed)
PATIENT: Karen Simon DOB: 1991-03-22  REASON FOR VISIT: follow up HISTORY FROM: patient  HISTORY OF PRESENT ILLNESS: Today 08/15/19  HISTORY Karen Simon is a 29 year old female, seen in request by emergency room for evaluation of confusion spells, initial evaluation was on April 11, 2019.  I have reviewed and summarized the referring note from the referring physician.  She was previously healthy, works at [employer], presented to emergency room on April 01, 2019 with weakness confusion spells  On March 31, 2019, while shopping at [employer], she suddenly had transient confusion, lasting for few seconds, there was no loss of consciousness, next day, while she was spending time with her family, she was noted to raise her right arm, finger fidgety movement, confusion, did not know where she was, mumbling, lasting 45 minutes, she is aware of the surroundings, but could not respond to that.  Her father,2 of paternal aunts have seizure, she has children at 68 and 84 years old, her 64-year-old daughter is going through sleep study for evaluation of confusion, waking up from sleep with fidgety movement.  I personally reviewed CT head without contrast April 01, 2019 that was normal  Laboratory evaluations showed positive UDS for THC, CMP showed normal creatinine 0.9, CBC with hemoglobin of 11.3  Update August 15, 2019 SS: Here today for follow-up, she stopped Keppra, reports side effect of moodiness.  EEG was normal.  She has not scheduled MRI of the brain, due to concerns about removing facial piercings, and does not have health insurance currently.  She has continued to report seizure episodes, 3-4 since last seen.  Usually around her menstrual cycle.  With the episodes, will feel confused, spacey, involuntary movement of the right arm, speech is mumbled, did not have control of her actions.  No loss of consciousness.  She is on a diet, but has not lost any weight,  no longer eats meat.  She works part-time at [employer].  REVIEW OF SYSTEMS: Out of a complete 14 system review of symptoms, the patient complains only of the following symptoms, and all other reviewed systems are negative.  Seizure  ALLERGIES: Allergies  Allergen Reactions  . Penicillins Anaphylaxis, Itching and Swelling  . Iodine Hives  . Latex Hives  . Shellfish Allergy Hives  . Tomato Hives and Swelling    HOME MEDICATIONS: Outpatient Medications Prior to Visit  Medication Sig Dispense Refill  . Multiple Vitamin (MULTIVITAMIN) tablet Take 1 tablet by mouth daily.    Marland Kitchen levETIRAcetam (KEPPRA) 500 MG tablet Take 1 tablet (500 mg total) by mouth 2 (two) times daily. (Patient not taking: Reported on 08/15/2019) 60 tablet 11   No facility-administered medications prior to visit.    PAST MEDICAL HISTORY: Past Medical History:  Diagnosis Date  . Anemia   . Asthma   . Enlarged heart   . H/O candidiasis   . H/O varicella   . Irregular periods/menstrual cycles 07/01/09  . Migraines   . Morbid obesity (HCC) 09/17/2015  . Seizure-like activity (HCC)     PAST SURGICAL HISTORY: Past Surgical History:  Procedure Laterality Date  . CHOLECYSTECTOMY  03/20/2012   Procedure: LAPAROSCOPIC CHOLECYSTECTOMY;  Surgeon: Shelly Rubenstein, MD;  Location: [facility];  Service: General;  Laterality: N/A;    FAMILY HISTORY: Family History  Problem Relation Age of Onset  . Hypertension Mother   . Cancer Maternal Grandmother        breast  . Cancer Maternal Grandfather  colon  . Seizures Father   . Sleep apnea Father   . Seizures Paternal Aunt        x 3 aunts  . Anesthesia problems Neg Hx     SOCIAL HISTORY: Social History   Socioeconomic History  . Marital status: Married    Spouse name: Not on file  . Number of children: 2  . Years of education: some college  . Highest education level: Not on file  Occupational History  . Occupation: Administrator, arts  . Occupation:  Food and Nutrition  Tobacco Use  . Smoking status: Never Smoker  . Smokeless tobacco: Never Used  Substance and Sexual Activity  . Alcohol use: Never  . Drug use: No  . Sexual activity: Yes    Birth control/protection: None  Other Topics Concern  . Not on file  Social History Narrative   Lives at home with her husband.   Right-handed.   2-4 cups caffeine per day.   Social Determinants of Health   Financial Resource Strain:   . Difficulty of Paying Living Expenses:   Food Insecurity:   . Worried About Charity fundraiser in the Last Year:   . Arboriculturist in the Last Year:   Transportation Needs:   . Film/video editor (Medical):   Marland Kitchen Lack of Transportation (Non-Medical):   Physical Activity:   . Days of Exercise per Week:   . Minutes of Exercise per Session:   Stress:   . Feeling of Stress :   Social Connections:   . Frequency of Communication with Friends and Family:   . Frequency of Social Gatherings with Friends and Family:   . Attends Religious Services:   . Active Member of Clubs or Organizations:   . Attends Archivist Meetings:   Marland Kitchen Marital Status:   Intimate Partner Violence:   . Fear of Current or Ex-Partner:   . Emotionally Abused:   Marland Kitchen Physically Abused:   . Sexually Abused:       PHYSICAL EXAM  Vitals:   08/15/19 1504  BP: 124/68  Pulse: 88  Temp: 97.8 F (36.6 C)  Weight: (!) 312 lb 6.4 oz (141.7 kg)  Height: 5\' 5"  (1.651 m)   Body mass index is 51.99 kg/m.  Generalized: Well developed, in no acute distress   Neurological examination  Mentation: Alert oriented to time, place, history taking. Follows all commands speech and language fluent Cranial nerve II-XII: Pupils were equal round reactive to light. Extraocular movements were full, visual field were full on confrontational test. Facial sensation and strength were normal. Uvula tongue midline. Head turning and shoulder shrug  were normal and symmetric. Motor: The motor  testing reveals 5 over 5 strength of all 4 extremities. Good symmetric motor tone is noted throughout.  Sensory: Sensory testing is intact to soft touch on all 4 extremities. No evidence of extinction is noted.  Coordination: Cerebellar testing reveals good finger-nose-finger and heel-to-shin bilaterally.  Gait and station: Gait is normal.  Reflexes: Deep tendon reflexes are symmetric and normal bilaterally.   DIAGNOSTIC DATA (LABS, IMAGING, TESTING) - I reviewed patient records, labs, notes, testing and imaging myself where available.  Lab Results  Component Value Date   WBC 8.8 04/01/2019   HGB 11.3 (L) 04/01/2019   HCT 37.3 04/01/2019   MCV 83.6 04/01/2019   PLT 300 04/01/2019      Component Value Date/Time   NA 137 04/01/2019 1851   K 4.2 04/01/2019 1851  CL 104 04/01/2019 1851   CO2 26 04/01/2019 1851   GLUCOSE 91 04/01/2019 1851   BUN 14 04/01/2019 1851   CREATININE 0.90 04/01/2019 1851   CALCIUM 8.8 (L) 04/01/2019 1851   PROT 7.6 04/01/2019 1851   ALBUMIN 3.8 04/01/2019 1851   AST 13 (L) 04/01/2019 1851   ALT 16 04/01/2019 1851   ALKPHOS 76 04/01/2019 1851   BILITOT 0.3 04/01/2019 1851   GFRNONAA >60 04/01/2019 1851   GFRAA >60 04/01/2019 1851   Lab Results  Component Value Date   CHOL 179 09/21/2015   HDL 43 (L) 09/21/2015   LDLCALC 123 09/21/2015   TRIG 64 09/21/2015   CHOLHDL 4.2 09/21/2015   No results found for: HGBA1C No results found for: VITAMINB12 Lab Results  Component Value Date   TSH 1.41 11/26/2010      ASSESSMENT AND PLAN 29 y.o. year old female  has a past medical history of Anemia, Asthma, Enlarged heart, H/O candidiasis, H/O varicella, Irregular periods/menstrual cycles (07/01/09), Migraines, Morbid obesity (HCC) (09/17/2015), and Seizure-like activity (HCC). here with:  1.  Probable complex partial seizure -Could not tolerate Keppra due to side effect of moodiness, stopped the medication, continued to have 3-4 episodes of probable  seizures -EEG was normal -MRI of the brain has yet to be completed, due to lack of insurance, concern for removing facial piercings -Will start on Lamictal 25 mg tablets, titrating to 100 mg twice a day, discussed side effects, once complete titration with 25 mg tablets, will switch to 100 mg tablets -Discussed should not drive until seizure-free for 6 months, due to lack of control during episodes -Follow-up in 4 months, document episodes, call for seizure activity  I spent 30 minutes of face-to-face and non-face-to-face time with patient.  This included previsit chart review, lab review, study review, order entry, electronic health record documentation, patient education.  Margie Ege, AGNP-C, DNP 08/15/2019, 3:41 PM [facility] [address], [address] [place], [place] [address] [phone]

## 2019-08-15 ENCOUNTER — Other Ambulatory Visit: Payer: Self-pay

## 2019-08-15 ENCOUNTER — Ambulatory Visit: Payer: Medicaid Other | Admitting: Neurology

## 2019-08-15 ENCOUNTER — Encounter: Payer: Self-pay | Admitting: Neurology

## 2019-08-15 VITALS — BP 124/68 | HR 88 | Temp 97.8°F | Ht 65.0 in | Wt 312.4 lb

## 2019-08-15 DIAGNOSIS — G40209 Localization-related (focal) (partial) symptomatic epilepsy and epileptic syndromes with complex partial seizures, not intractable, without status epilepticus: Secondary | ICD-10-CM

## 2019-08-15 MED ORDER — LAMOTRIGINE 25 MG PO TABS: ORAL_TABLET | ORAL | 0 refills | Status: DC

## 2019-08-15 NOTE — Patient Instructions (Addendum)
Start Lamictal titration with 25 mg tablets 1 tablet twice a day for the first week 2 tablets twice a day for the second week 3 tablets twice a day for the third week 4 tablets twice a day for the fourth week  For total of 140 tablets  After finish titration with small dose of lamotrigine 25 mg, change to lamotrigine 100 mg twice a day (call me and will send in new rx)  No driving until seizure free for 6 months  Lamotrigine tablets What is this medicine? LAMOTRIGINE (la MOE Patrecia Pace) is used to control seizures in adults and children with epilepsy and Lennox-Gastaut syndrome. It is also used in adults to treat bipolar disorder. This medicine may be used for other purposes; ask your health care provider or pharmacist if you have questions. COMMON BRAND NAME(S): Lamictal, Subvenite What should I tell my health care provider before I take this medicine? They need to know if you have any of these conditions:  aseptic meningitis during prior use of lamotrigine  depression  folate deficiency  kidney disease  liver disease  suicidal thoughts, plans, or attempt; a previous suicide attempt by you or a family member  an unusual or allergic reaction to lamotrigine or other seizure medications, other medicines, foods, dyes, or preservatives  pregnant or trying to get pregnant  breast-feeding How should I use this medicine? Take this medicine by mouth with a glass of water. Follow the directions on the prescription label. Do not chew these tablets. If this medicine upsets your stomach, take it with food or milk. Take your doses at regular intervals. Do not take your medicine more often than directed. A special MedGuide will be given to you by the pharmacist with each new prescription and refill. Be sure to read this information carefully each time. Talk to your pediatrician regarding the use of this medicine in children. While this drug may be prescribed for children as young as 2 years  for selected conditions, precautions do apply. Overdosage: If you think you have taken too much of this medicine contact a poison control center or emergency room at once. NOTE: This medicine is only for you. Do not share this medicine with others. What if I miss a dose? If you miss a dose, take it as soon as you can. If it is almost time for your next dose, take only that dose. Do not take double or extra doses. What may interact with this medicine?  atazanavir  carbamazepine  female hormones, including contraceptive or birth control pills  lopinavir  methotrexate  phenobarbital  phenytoin  primidone  pyrimethamine  rifampin  ritonavir  trimethoprim  valproic acid This list may not describe all possible interactions. Give your health care provider a list of all the medicines, herbs, non-prescription drugs, or dietary supplements you use. Also tell them if you smoke, drink alcohol, or use illegal drugs. Some items may interact with your medicine. What should I watch for while using this medicine? Visit your doctor or health care provider for regular checks on your progress. If you take this medicine for seizures, wear a Medic Alert bracelet or necklace. Carry an identification card with information about your condition, medicines, and doctor or health care provider. It is important to take this medicine exactly as directed. When first starting treatment, your dose will need to be adjusted slowly. It may take weeks or months before your dose is stable. You should contact your doctor or health care provider if your  seizures get worse or if you have any new types of seizures. Do not stop taking this medicine unless instructed by your doctor or health care provider. Stopping your medicine suddenly can increase your seizures or their severity. This medicine may cause serious skin reactions. They can happen weeks to months after starting the medicine. Contact your health care provider  right away if you notice fevers or flu-like symptoms with a rash. The rash may be red or purple and then turn into blisters or peeling of the skin. Or, you might notice a red rash with swelling of the face, lips or lymph nodes in your neck or under your arms. You may get drowsy, dizzy, or have blurred vision. Do not drive, use machinery, or do anything that needs mental alertness until you know how this medicine affects you. To reduce dizzy or fainting spells, do not sit or stand up quickly, especially if you are an older patient. Alcohol can increase drowsiness and dizziness. Avoid alcoholic drinks. If you are taking this medicine for bipolar disorder, it is important to report any changes in your mood to your doctor or health care provider. If your condition gets worse, you get mentally depressed, feel very hyperactive or manic, have difficulty sleeping, or have thoughts of hurting yourself or committing suicide, you need to get help from your health care provider right away. If you are a caregiver for someone taking this medicine for bipolar disorder, you should also report these behavioral changes right away. The use of this medicine may increase the chance of suicidal thoughts or actions. Pay special attention to how you are responding while on this medicine. Your mouth may get dry. Chewing sugarless gum or sucking hard candy, and drinking plenty of water may help. Contact your doctor if the problem does not go away or is severe. Women who become pregnant while using this medicine may enroll in the [place] Pregnancy Registry by calling [phone]. This registry collects information about the safety of antiepileptic drug use during pregnancy. This medicine may cause a decrease in folic acid. You should make sure that you get enough folic acid while you are taking this medicine. Discuss the foods you eat and the vitamins you take with your health care provider. What side  effects may I notice from receiving this medicine? Side effects that you should report to your doctor or health care professional as soon as possible:  allergic reactions like skin rash, itching or hives, swelling of the face, lips, or tongue  changes in vision  depressed mood  elevated mood, decreased need for sleep, racing thoughts, impulsive behavior  loss of balance or coordination  mouth sores  rash, fever, and swollen lymph nodes  redness, blistering, peeling or loosening of the skin, including inside the mouth  right upper belly pain  seizures  severe muscle pain  signs and symptoms of aseptic meningitis such as stiff neck and sensitivity to light, headache, drowsiness, fever, nausea, vomiting, rash  signs of infection - fever or chills, cough, sore throat, pain or difficulty passing urine  suicidal thoughts or other mood changes  swollen lymph nodes  trouble walking  unusual bruising or bleeding  unusually weak or tired  yellowing of the eyes or skin Side effects that usually do not require medical attention (report to your doctor or health care professional if they continue or are bothersome):  diarrhea  dizziness  dry mouth  stuffy nose  tiredness  tremors  trouble sleeping This  list may not describe all possible side effects. Call your doctor for medical advice about side effects. You may report side effects to FDA at 1-800-FDA-1088. Where should I keep my medicine? Keep out of reach of children. Store at room temperature between 15 and 30 degrees C (59 and 86 degrees F). Throw away any unused medicine after the expiration date. NOTE: This sheet is a summary. It may not cover all possible information. If you have questions about this medicine, talk to your doctor, pharmacist, or health care provider.  2020 Elsevier/Gold Standard (2018-07-27 15:03:40)

## 2019-09-06 ENCOUNTER — Ambulatory Visit (HOSPITAL_COMMUNITY)
Admission: EM | Admit: 2019-09-06 | Discharge: 2019-09-06 | Disposition: A | Discharge status: Home / Self Care | Payer: Self-pay | Attending: Family Medicine | Admitting: Family Medicine

## 2019-09-06 ENCOUNTER — Encounter (HOSPITAL_COMMUNITY): Payer: Self-pay

## 2019-09-06 ENCOUNTER — Ambulatory Visit (HOSPITAL_COMMUNITY): Payer: Self-pay

## 2019-09-06 ENCOUNTER — Ambulatory Visit (INDEPENDENT_AMBULATORY_CARE_PROVIDER_SITE_OTHER): Payer: Self-pay

## 2019-09-06 ENCOUNTER — Other Ambulatory Visit: Payer: Self-pay

## 2019-09-06 DIAGNOSIS — M25562 Pain in left knee: Secondary | ICD-10-CM

## 2019-09-06 IMAGING — DX DG KNEE COMPLETE 4+V*L*
3 series · 3 of 3 positions shown · non-contrast
Comparison: None.

CLINICAL DATA: Pain.  Fall approximately 10 days prior

EXAM:
LEFT KNEE - COMPLETE 4+ VIEW

[knee obl (1 of 2)]
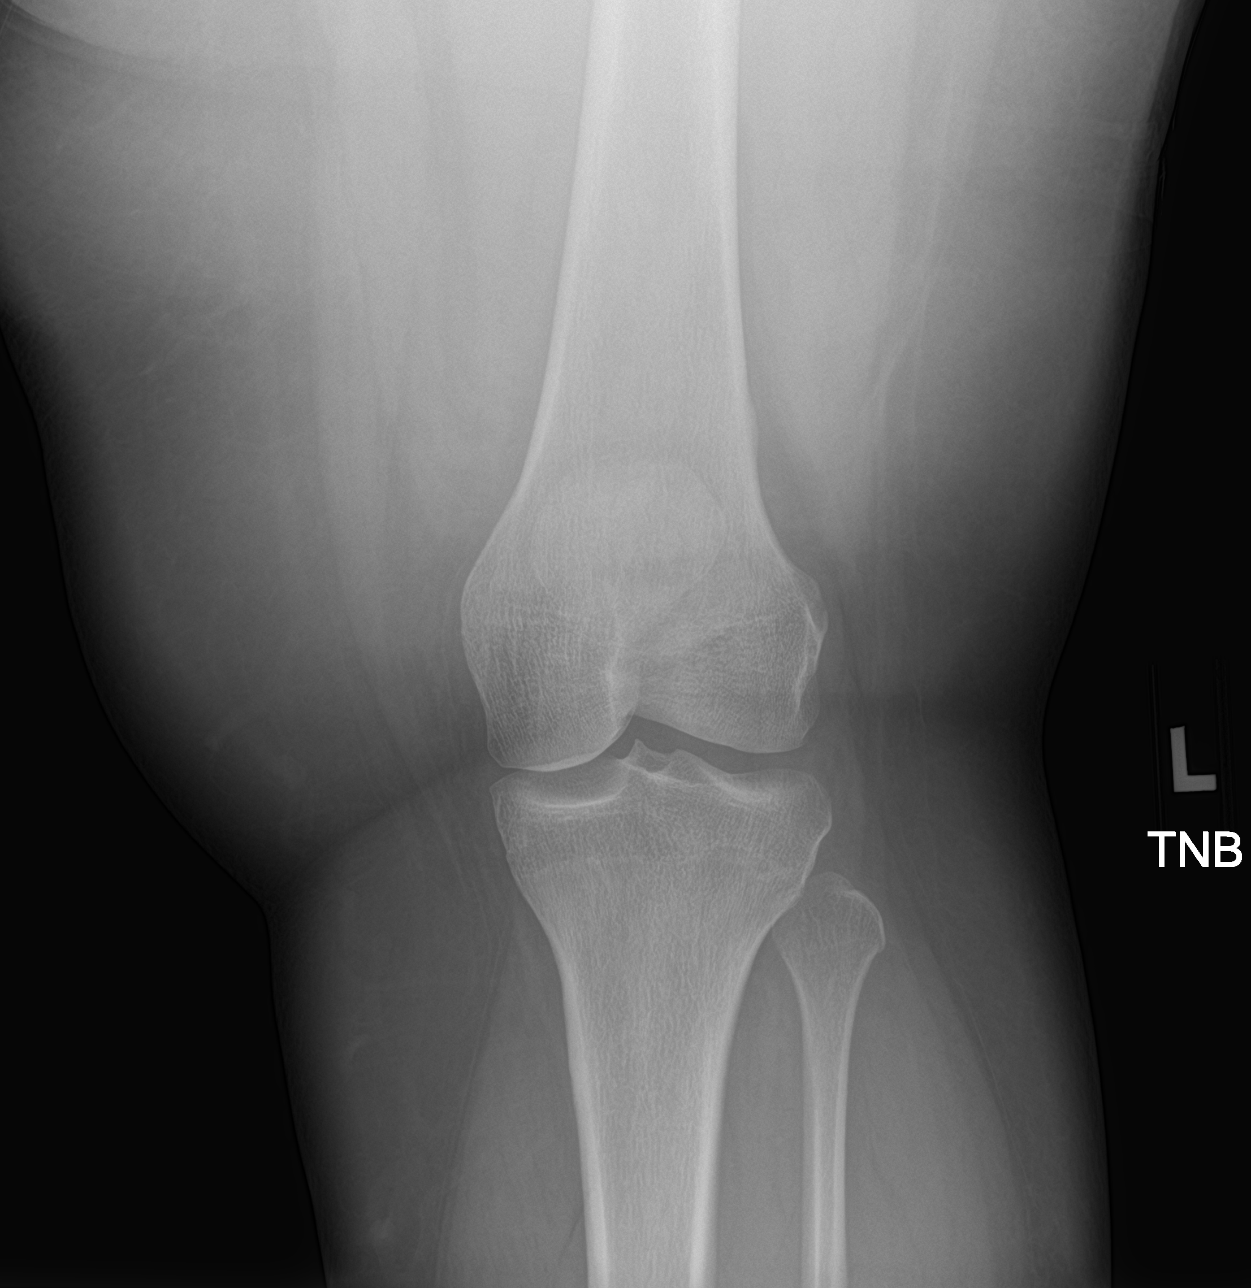

[knee obl (2 of 2)]
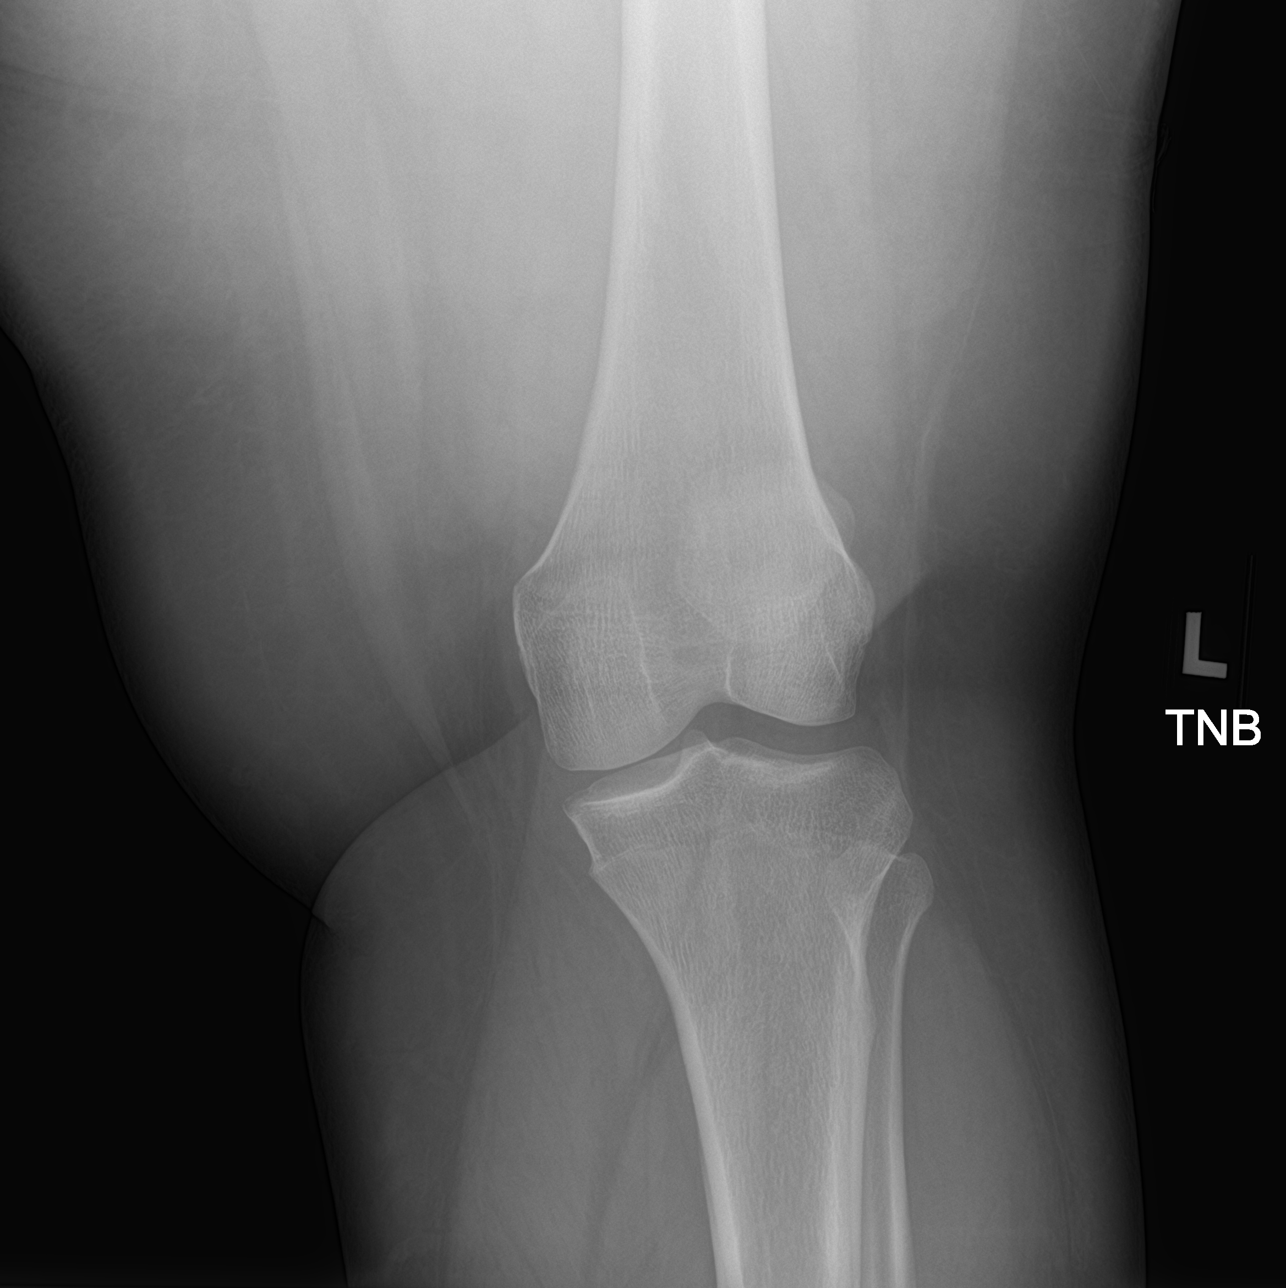

[knee lat]
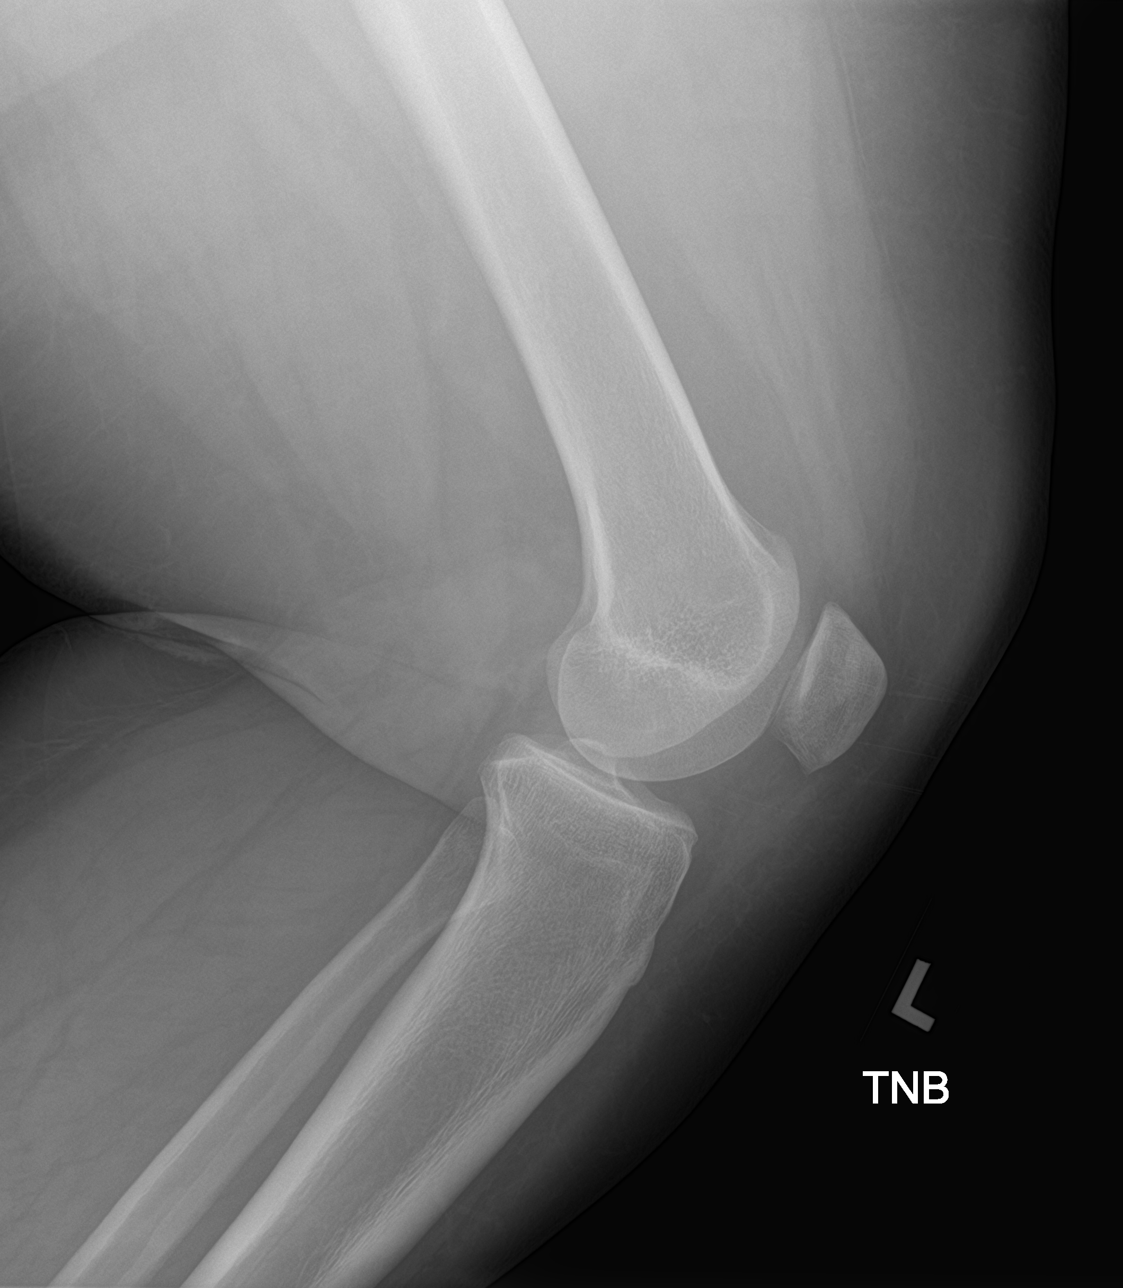

[3 of 3 positions shown; findings below may reference images not displayed]

FINDINGS: Frontal, lateral, bilateral oblique, and sunrise patellar images
were obtained. No evident fracture or dislocation. No joint
effusion. Joint spaces appear normal. No erosive change.
IMPRESSION: No fracture, dislocation, or joint effusion. No evident arthropathy.

## 2019-09-06 MED ORDER — PREDNISONE 50 MG PO TABS
ORAL_TABLET | ORAL | 0 refills | Status: DC
Start: 2019-09-06 — End: 2020-03-25

## 2019-09-06 MED ORDER — KETOROLAC TROMETHAMINE 30 MG/ML IJ SOLN: 30.0000 mg | Freq: Once | INTRAMUSCULAR | Status: DC

## 2019-09-06 MED ORDER — TIZANIDINE HCL 4 MG PO TABS
4.0000 mg | ORAL_TABLET | Freq: Every day | ORAL | 0 refills | Status: DC
Start: 2019-09-06 — End: 2020-03-25

## 2019-09-06 MED ORDER — METHYLPREDNISOLONE SODIUM SUCC 125 MG IJ SOLR: 125.0000 mg | Freq: Once | INTRAMUSCULAR | Status: DC

## 2019-09-06 NOTE — Discharge Instructions (Addendum)
Start prednisone 50 mg with food today.  Continue to take daily for period of 4 days.  For pain I have prescribed you tizanidine 4 mg.  Take medication at night as it can cause drowsiness.  If symptoms worsen or do not improve please follow-up.

## 2019-09-06 NOTE — ED Provider Notes (Signed)
[facility]    CSN: [id number] Arrival date & time: 09/06/19  3818      History   Chief Complaint Chief Complaint  Patient presents with  . Knee Pain    HPI Karen Simon is a 29 y.o. female.   HPI  Patient presents today with a complaint of some left knee pain x1 week. Patient reports that she experienced a fall over a week ago and has felt what appears to be a " bubble " within her left knee. Denies any prior history of left knee pain or injury.  Pain is exacerbated with ambulation.  She has attempted relief with conservative measures without significant improvement. Past Medical History:  Diagnosis Date  . Anemia   . Asthma   . Enlarged heart   . H/O candidiasis   . H/O varicella   . Irregular periods/menstrual cycles 07/01/09  . Migraines   . Morbid obesity (HCC) 09/17/2015  . Seizure-like activity [facility])     Patient Active Problem List   Diagnosis Date Noted  . Partial symptomatic epilepsy with complex partial seizures, not intractable, without status epilepticus (HCC) 04/11/2019  . Morbid obesity (HCC) 09/17/2015  . Normal labor 02/23/2014  . Lactating mother 11/27/2011  . Teen pregnancy 11/27/2011  . Pregnant state, incidental 11/25/2011  . Vaginal delivery 11/25/2011  . Perineal laceration complicating delivery 11/25/2011  . Candida albicans infection 11/15/2011  . Excess weight gain in pregnancy 11/07/2011  . Anemia in pregnancy 09/14/2011  . History of high blood pressure - never took meds 04/12/2011  . 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 History of anemia 11/26/2010  . Asthma 11/26/2010  . Migraine 11/26/2010    Past Surgical History:  Procedure Laterality Date  . CHOLECYSTECTOMY  03/20/2012   Procedure: LAPAROSCOPIC CHOLECYSTECTOMY;  Surgeon: Shelly Rubenstein, MD;  Location: [facility];  Service: General;  Laterality: N/A;    OB History    Gravida  2   Para  2   Term  2   Preterm  0   AB  0   Living  2     SAB  0   TAB  0   Ectopic  0   Multiple  0   Live Births  2            Home Medications    Prior to Admission medications   Medication Sig Start Date End Date Taking? Authorizing Provider  lamoTRIgine (LAMICTAL) 25 MG tablet 1 tablet twice a day for the first week 2 tablets twice a day for the second week 3 tablets twice a day for the third week 4 tablets twice a day for the fourth week  For total of 140 tablets  After finish titration with small dose of lamotrigine 25 mg, change to lamotrigine 100 mg twice a day 08/15/19   Glean Salvo, NP  Multiple Vitamin (MULTIVITAMIN) tablet Take 1 tablet by mouth daily.    [provider]    Family History Family History  Problem Relation Age of Onset  . Hypertension Mother   . Cancer Maternal Grandmother        breast  . Cancer Maternal Grandfather        colon  . Seizures Father   . Sleep apnea Father   . Seizures Paternal Aunt        x 3 aunts  . Anesthesia problems Neg Hx     Social History Social History   Tobacco  Use  . Smoking status: Never Smoker  . Smokeless tobacco: Never Used  Substance Use Topics  . Alcohol use: Never  . Drug use: No     Allergies   Penicillins, Iodine, Latex, Shellfish allergy, and Tomato   Review of Systems Review of Systems Pertinent negatives listed in HPI Physical Exam Triage Vital Signs ED Triage Vitals  Enc Vitals Group     BP 09/06/19 0939 125/73     Pulse Rate 09/06/19 0939 73     Resp 09/06/19 0939 18     Temp 09/06/19 0939 98.5 F (36.9 C)     Temp Source 09/06/19 0939 Oral     SpO2 09/06/19 0939 99 %     Weight --      Height --      Head Circumference --      Peak Flow --      Pain Score 09/06/19 0938 7     Pain Loc --      Pain Edu? --      Excl. in [place]? --    No data found.  Updated Vital Signs BP 125/73 (BP Location: Right Arm)   Pulse 73   Temp 98.5 F (36.9 C) (Oral)   Resp 18   LMP 09/06/2019   SpO2 99%    Visual Acuity Right Eye Distance:   Left Eye Distance:   Bilateral Distance:    Right Eye Near:   Left Eye Near:    Bilateral Near:     Physical Exam Constitutional:      Appearance: She is obese. She is not ill-appearing or toxic-appearing.  Cardiovascular:     Rate and Rhythm: Normal rate and regular rhythm.  Pulmonary:     Effort: Pulmonary effort is normal.     Breath sounds: Normal breath sounds.  Musculoskeletal:     Cervical back: Normal range of motion and neck supple.     Left knee: No swelling, effusion, erythema or ecchymosis. Normal range of motion. Tenderness present.  Skin:    General: Skin is warm.  Neurological:     General: No focal deficit present.     Mental Status: She is alert.  Psychiatric:        Attention and Perception: Attention normal.        Mood and Affect: Mood normal.    UC Treatments / Results  Labs (all labs ordered are listed, but only abnormal results are displayed) Labs Reviewed - No data to display  EKG   Radiology DG Knee Complete 4 Views Left  Result Date: 09/06/2019 CLINICAL DATA:  Pain.  Fall approximately 10 days prior EXAM: LEFT KNEE - COMPLETE 4+ VIEW COMPARISON:  None. FINDINGS: Frontal, lateral, bilateral oblique, and sunrise patellar images were obtained. No evident fracture or dislocation. No joint effusion. Joint spaces appear normal. No erosive change. IMPRESSION: No fracture, dislocation, or joint effusion. No evident arthropathy. Electronically Signed   By: Lowella Grip III M.D.   On: 09/06/2019 10:45    Procedures Procedures (including critical care time)  Medications Ordered in UC Medications - No data to display  Initial Impression / Assessment and Plan / UC Course  I have reviewed the triage vital signs and the nursing notes.  Pertinent labs & imaging results that were available during my care of the patient were reviewed by me and considered in my medical decision making (see chart for details).      Acute pain of left knee Suspect left knee strain resulting  from recent fall.  Plain films obtained of the left knee today are grossly reassuring and negative for acute fracture or effusion.  Will have patient start prednisone 50 mg once daily with food today.  For pain management will trial tizanidine 4 mg at bedtime.  If knee pain does not resolve with treatment did recommend a follow-up with an orthopedic specialist.  Patient verbalized understanding and agreement with plan. Final Clinical Impressions(s) / UC Diagnoses   Final diagnoses:  Acute pain of left knee     Discharge Instructions     Start prednisone 50 mg with food today.  Continue to take daily for period of 4 days.  For pain I have prescribed you tizanidine 4 mg.  Take medication at night as it can cause drowsiness.  If symptoms worsen or do not improve please follow-up.    ED Prescriptions    Medication Sig Dispense Auth. Provider   tiZANidine (ZANAFLEX) 4 MG tablet Take 1 tablet (4 mg total) by mouth at bedtime. 20 tablet Bing Neighbors, FNP   predniSONE (DELTASONE) 50 MG tablet Take 1 tablet with food by mouth daily for 5 days. 5 tablet Bing Neighbors, FNP     PDMP not reviewed this encounter.   Bing Neighbors, [place] 09/08/19 [phone]

## 2019-09-06 NOTE — ED Triage Notes (Signed)
Pt reports having left knee pain for over a week after she fell. Pt reports she feels she is having a bubble in her left knee.

## 2019-09-17 NOTE — Progress Notes (Signed)
I have reviewed and agreed above plan. 

## 2019-09-30 ENCOUNTER — Emergency Department (HOSPITAL_BASED_OUTPATIENT_CLINIC_OR_DEPARTMENT_OTHER)
Admission: EM | Admit: 2019-09-30 | Discharge: 2019-09-30 | Disposition: A | Discharge status: Home / Self Care | Payer: Medicaid Other | Attending: Emergency Medicine | Admitting: Emergency Medicine

## 2019-09-30 ENCOUNTER — Other Ambulatory Visit: Payer: Self-pay

## 2019-09-30 ENCOUNTER — Encounter (HOSPITAL_BASED_OUTPATIENT_CLINIC_OR_DEPARTMENT_OTHER): Payer: Self-pay | Admitting: *Deleted

## 2019-09-30 DIAGNOSIS — Y999 Unspecified external cause status: Secondary | ICD-10-CM | POA: Insufficient documentation

## 2019-09-30 DIAGNOSIS — Z91040 Latex allergy status: Secondary | ICD-10-CM | POA: Insufficient documentation

## 2019-09-30 DIAGNOSIS — X58XXXA Exposure to other specified factors, initial encounter: Secondary | ICD-10-CM | POA: Insufficient documentation

## 2019-09-30 DIAGNOSIS — J45909 Unspecified asthma, uncomplicated: Secondary | ICD-10-CM | POA: Insufficient documentation

## 2019-09-30 DIAGNOSIS — Z79899 Other long term (current) drug therapy: Secondary | ICD-10-CM | POA: Insufficient documentation

## 2019-09-30 DIAGNOSIS — Y92008 Other place in unspecified non-institutional (private) residence as the place of occurrence of the external cause: Secondary | ICD-10-CM | POA: Insufficient documentation

## 2019-09-30 DIAGNOSIS — Y9389 Activity, other specified: Secondary | ICD-10-CM | POA: Insufficient documentation

## 2019-09-30 DIAGNOSIS — H9202 Otalgia, left ear: Secondary | ICD-10-CM | POA: Insufficient documentation

## 2019-09-30 HISTORY — DX: Unspecified convulsions: R56.9

## 2019-09-30 NOTE — ED Triage Notes (Signed)
Pt reports she has cotton from q-tip stuck in her left ear x 30 mins

## 2019-09-30 NOTE — ED Provider Notes (Signed)
[facility] EMERGENCY DEPARTMENT Provider Note   CSN: [id number] Arrival date & time: 09/30/19  0018     History Chief Complaint  Patient presents with  . Foreign Body in [place] is a 29 y.o. female.  HPI     This is a 29 year old female who presents with concerns for foreign body in the left ear.  Patient reports that she was cleaning her ears with a Q-tip when she noted that some of the Q-tip appeared to not come out fully.  She tried to retrieve the extra part but was unsuccessful.  Denies hearing loss or pain.  Denies any purulence.  Past Medical History:  Diagnosis Date  . Anemia   . Asthma   . Enlarged heart   . H/O candidiasis   . H/O varicella   . Irregular periods/menstrual cycles 07/01/09  . Migraines   . Morbid obesity ([place]) 09/17/2015  . Seizure-like activity ([place])   . Seizures [facility])     Patient Active Problem List   Diagnosis Date Noted  . Partial symptomatic epilepsy with complex partial seizures, not intractable, without status epilepticus ([place]) 04/11/2019  . Morbid obesity ([place]) 09/17/2015  . Normal labor 02/23/2014  . Lactating mother 11/27/2011  . Teen pregnancy 11/27/2011  . Pregnant state, incidental 11/25/2011  . Vaginal delivery 11/25/2011  . Perineal laceration complicating delivery 77/41/2878  . Candida albicans infection 11/15/2011  . Excess weight gain in pregnancy 11/07/2011  . Anemia in pregnancy 09/14/2011  . History of high blood pressure - never took meds 04/12/2011  . 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 History of anemia 11/26/2010  . Asthma 11/26/2010  . Migraine 11/26/2010    Past Surgical History:  Procedure Laterality Date  . CHOLECYSTECTOMY  03/20/2012   Procedure: LAPAROSCOPIC CHOLECYSTECTOMY;  Surgeon: Harl Bowie, MD;  Location: [facility];  Service: General;  Laterality: N/A;     OB History    Gravida  2   Para  2   Term  2   Preterm  0   AB  0   Living  2     SAB  0   TAB  0   Ectopic  0   Multiple  0   Live Births  2           Family History  Problem Relation Age of Onset  . Hypertension Mother   . Cancer Maternal Grandmother        breast  . Cancer Maternal Grandfather        colon  . Seizures Father   . Sleep apnea Father   . Seizures Paternal Aunt        x 3 aunts  . Anesthesia problems Neg Hx     Social History   Tobacco Use  . Smoking status: Never Smoker  . Smokeless tobacco: Never Used  Substance Use Topics  . Alcohol use: Never  . Drug use: No    Home Medications Prior to Admission medications   Medication Sig Start Date End Date Taking? Authorizing Provider  lamoTRIgine (LAMICTAL) 25 MG tablet 1 tablet twice a day for the first week 2 tablets twice a day for the second week 3 tablets twice a day for the third week 4 tablets twice a day for the fourth week  For total of 140 tablets  After finish titration with small dose of lamotrigine 25 mg, change to lamotrigine 100 mg twice a day  08/15/19   Glean Salvo, NP  Multiple Vitamin (MULTIVITAMIN) tablet Take 1 tablet by mouth daily.    [provider]  predniSONE (DELTASONE) 50 MG tablet Take 1 tablet with food by mouth daily for 5 days. 09/06/19   Bing Neighbors, FNP  tiZANidine (ZANAFLEX) 4 MG tablet Take 1 tablet (4 mg total) by mouth at bedtime. 09/06/19   Bing Neighbors, FNP    Allergies    Penicillins, Iodine, Latex, Shellfish allergy, and Tomato  Review of Systems   Review of Systems  Constitutional: Negative for fever.  HENT: Negative for ear discharge and ear pain.   All other systems reviewed and are negative.   Physical Exam Updated Vital Signs BP 106/63 (BP Location: Left Wrist)   Pulse 77   Temp 98.9 F (37.2 C) (Oral)   Resp 16   Ht 1.651 m (5\' 5" )   Wt 131.5 kg   LMP 09/06/2019   SpO2 100%   BMI 48.26 kg/m   Physical Exam Vitals and nursing note reviewed.  Constitutional:      Appearance:  She is well-developed.     Comments: Morbidly obese, non-ill-appearing  HENT:     Head: Normocephalic and atraumatic.     Ears:     Comments: Bilateral TMs clear, no erythema, no obvious foreign bodies in the external auditory canal    Nose: Nose normal.     Mouth/Throat:     Mouth: Mucous membranes are moist.  Cardiovascular:     Rate and Rhythm: Normal rate and regular rhythm.     Heart sounds: Normal heart sounds.  Pulmonary:     Effort: Pulmonary effort is normal. No respiratory distress.     Breath sounds: No wheezing.  Musculoskeletal:     Cervical back: Neck supple.     Right lower leg: No edema.     Left lower leg: No edema.  Skin:    General: Skin is warm and dry.  Neurological:     Mental Status: She is alert and oriented to person, place, and time.  Psychiatric:        Mood and Affect: Mood normal.     ED Results / Procedures / Treatments   Labs (all labs ordered are listed, but only abnormal results are displayed) Labs Reviewed - No data to display  EKG None  Radiology No results found.  Procedures Procedures (including critical care time)  Medications Ordered in ED Medications - No data to display  ED Course  I have reviewed the triage vital signs and the nursing notes.  Pertinent labs & imaging results that were available during my care of the patient were reviewed by me and considered in my medical decision making (see chart for details).    MDM Rules/Calculators/A&P                       Patient presents with concern for foreign body retained in the left ear.  No evidence of foreign body on exam.  Patient reassured.  TM intact and no evidence of perforation.  Advised patient that she does not need to use Q-tips to clean out her ears.  She should use something that is no smaller than her pinky finger.  Patient stated understanding.  After history, exam, and medical workup I feel the patient has been appropriately medically screened and is safe  for discharge home. Pertinent diagnoses were discussed with the patient. Patient was given return precautions.  Final Clinical Impression(s) / ED Diagnoses Final diagnoses:  Left ear pain    Rx / DC Orders ED Discharge Orders    None       Breven Guidroz, Mayer Masker, MD 09/30/19 [phone]

## 2019-09-30 NOTE — Discharge Instructions (Addendum)
You were seen today with concern for foreign body in the left ear.  Your exam does not show any evidence of foreign body.

## 2019-12-17 ENCOUNTER — Ambulatory Visit: Payer: Medicaid Other | Admitting: Neurology

## 2020-01-20 ENCOUNTER — Other Ambulatory Visit: Payer: Self-pay

## 2020-01-20 ENCOUNTER — Emergency Department (HOSPITAL_BASED_OUTPATIENT_CLINIC_OR_DEPARTMENT_OTHER)
Admission: EM | Admit: 2020-01-20 | Discharge: 2020-01-20 | Disposition: A | Discharge status: Home / Self Care | Payer: Medicaid Other | Attending: Emergency Medicine | Admitting: Emergency Medicine

## 2020-01-20 ENCOUNTER — Encounter (HOSPITAL_BASED_OUTPATIENT_CLINIC_OR_DEPARTMENT_OTHER): Payer: Self-pay | Admitting: Emergency Medicine

## 2020-01-20 DIAGNOSIS — E669 Obesity, unspecified: Secondary | ICD-10-CM | POA: Insufficient documentation

## 2020-01-20 DIAGNOSIS — G43909 Migraine, unspecified, not intractable, without status migrainosus: Secondary | ICD-10-CM | POA: Insufficient documentation

## 2020-01-20 DIAGNOSIS — H53149 Visual discomfort, unspecified: Secondary | ICD-10-CM | POA: Insufficient documentation

## 2020-01-20 DIAGNOSIS — Z6841 Body Mass Index (BMI) 40.0 and over, adult: Secondary | ICD-10-CM | POA: Insufficient documentation

## 2020-01-20 DIAGNOSIS — J45909 Unspecified asthma, uncomplicated: Secondary | ICD-10-CM | POA: Insufficient documentation

## 2020-01-20 MED ORDER — METOCLOPRAMIDE HCL 5 MG/ML IJ SOLN
10.0000 mg | Freq: Once | INTRAMUSCULAR | Status: AC
  Administered 2020-01-20: 10 mg via INTRAVENOUS
  Filled 2020-01-20: qty 2

## 2020-01-20 MED ORDER — SODIUM CHLORIDE 0.9 % IV SOLN
INTRAVENOUS | Status: DC | PRN
  Administered 2020-01-20: 500 mL via INTRAVENOUS

## 2020-01-20 MED ORDER — DIPHENHYDRAMINE HCL 50 MG/ML IJ SOLN
25.0000 mg | Freq: Once | INTRAMUSCULAR | Status: AC
  Administered 2020-01-20: 25 mg via INTRAVENOUS
  Filled 2020-01-20: qty 1

## 2020-01-20 MED ORDER — KETOROLAC TROMETHAMINE 15 MG/ML IJ SOLN
15.0000 mg | Freq: Once | INTRAMUSCULAR | Status: AC
  Administered 2020-01-20: 15 mg via INTRAVENOUS
  Filled 2020-01-20: qty 1

## 2020-01-20 NOTE — ED Triage Notes (Signed)
Reports having a migraine that started at 0700 and had a witnessed seizure around 5pm.  Hx of seizures but is not taking any meds.  Took tylenol 1gm at 0800.  Hasn't tried anything else.

## 2020-01-20 NOTE — ED Provider Notes (Signed)
[facility] EMERGENCY DEPARTMENT Provider Note   CSN: [id number] Arrival date & time: 01/20/20  0030     History Chief Complaint  Patient presents with  . Headache    Karen Simon is a 29 y.o. female.  HPI    This is a 29 year old female with a history of migraines, seizures who presents with headache.  Patient reports remote history of migraines but she is not on any maintenance medication.  She has had a headache since yesterday morning at 7 AM.  She reports a left-sided temporal headache that is sharp in nature.  She rates her pain at 8 out of 10.  She did take some Tylenol with no relief.  She states that normally she is able to take Tylenol and this relieves her headaches.  She states that she had a seizure yesterday morning but does not feel that this is related.  She reports 1-2 seizures monthly.  She does not currently take any seizure medication because of side effects.  She last saw her neurologist last month.  She is attempting CBD for treatment.  She denies any recent fevers or illnesses.  Denies weakness, numbness, tingling, strokelike symptoms.  No vision changes. Past Medical History:  Diagnosis Date  . Anemia   . Asthma   . Enlarged heart   . H/O candidiasis   . H/O varicella   . Irregular periods/menstrual cycles 07/01/09  . Migraines   . Morbid obesity ([facility]) 09/17/2015  . Seizure-like activity ([facility])   . Seizures [facility])     Patient Active Problem List   Diagnosis Date Noted  . Partial symptomatic epilepsy with complex partial seizures, not intractable, without status epilepticus ([facility]) 04/11/2019  . Morbid obesity ([facility]) 09/17/2015  . Normal labor 02/23/2014  . Lactating mother 11/27/2011  . Teen pregnancy 11/27/2011  . Pregnant state, incidental 11/25/2011  . Vaginal delivery 11/25/2011  . Perineal laceration complicating delivery 11/25/2011  . Candida albicans infection 11/15/2011  . Excess weight gain in pregnancy 11/07/2011  . Anemia in  pregnancy 09/14/2011  . History of high blood pressure - never took meds 04/12/2011  . 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 History of anemia 11/26/2010  . Asthma 11/26/2010  . Migraine 11/26/2010    Past Surgical History:  Procedure Laterality Date  . CHOLECYSTECTOMY  03/20/2012   Procedure: LAPAROSCOPIC CHOLECYSTECTOMY;  Surgeon: Shelly Rubenstein, MD;  Location: [facility];  Service: General;  Laterality: N/A;     OB History    Gravida  2   Para  2   Term  2   Preterm  0   AB  0   Living  2     SAB  0   TAB  0   Ectopic  0   Multiple  0   Live Births  2           Family History  Problem Relation Age of Onset  . Hypertension Mother   . Cancer Maternal Grandmother        breast  . Cancer Maternal Grandfather        colon  . Seizures Father   . Sleep apnea Father   . Seizures Paternal Aunt        x 3 aunts  . Anesthesia problems Neg Hx     Social History   Tobacco Use  . Smoking status: Never Smoker  . Smokeless tobacco: Never Used  Substance Use Topics  . Alcohol use:  Never  . Drug use: No    Home Medications Prior to Admission medications   Medication Sig Start Date End Date Taking? Authorizing Provider  lamoTRIgine (LAMICTAL) 25 MG tablet 1 tablet twice a day for the first week 2 tablets twice a day for the second week 3 tablets twice a day for the third week 4 tablets twice a day for the fourth week  For total of 140 tablets  After finish titration with small dose of lamotrigine 25 mg, change to lamotrigine 100 mg twice a day 08/15/19   Glean Salvo, NP  Multiple Vitamin (MULTIVITAMIN) tablet Take 1 tablet by mouth daily.    [provider]  predniSONE (DELTASONE) 50 MG tablet Take 1 tablet with food by mouth daily for 5 days. 09/06/19   Bing Neighbors, FNP  tiZANidine (ZANAFLEX) 4 MG tablet Take 1 tablet (4 mg total) by mouth at bedtime. 09/06/19   Bing Neighbors, FNP    Allergies      Penicillins, Iodine, Latex, Shellfish allergy, and Tomato  Review of Systems   Review of Systems  Constitutional: Negative for fever.  Eyes: Positive for photophobia. Negative for visual disturbance.  Respiratory: Negative for shortness of breath.   Cardiovascular: Negative for chest pain.  Gastrointestinal: Negative for abdominal pain, nausea and vomiting.  Genitourinary: Negative for dysuria.  Neurological: Positive for seizures and headaches.  All other systems reviewed and are negative.   Physical Exam Updated Vital Signs BP 108/68   Pulse 61   Temp 98.3 F (36.8 C) (Oral)   Resp 18   Ht 1.651 m (5\' 5" )   Wt 130.6 kg   SpO2 99%   BMI 47.93 kg/m   Physical Exam Vitals and nursing note reviewed.  Constitutional:      Appearance: She is well-developed. She is obese. She is not ill-appearing.  HENT:     Head: Normocephalic and atraumatic.  Eyes:     Pupils: Pupils are equal, round, and reactive to light.  Cardiovascular:     Rate and Rhythm: Normal rate and regular rhythm.     Heart sounds: Normal heart sounds.  Pulmonary:     Effort: Pulmonary effort is normal. No respiratory distress.     Breath sounds: No wheezing.  Abdominal:     General: Bowel sounds are normal.     Palpations: Abdomen is soft.  Musculoskeletal:     Cervical back: Neck supple.  Skin:    General: Skin is warm and dry.  Neurological:     Mental Status: She is alert and oriented to person, place, and time.     Comments: Cranial nerves II through XII intact, 5 out of 5 strength in all 4 extremities, no dysmetria to finger-nose-finger  Psychiatric:        Mood and Affect: Mood normal.     ED Results / Procedures / Treatments   Labs (all labs ordered are listed, but only abnormal results are displayed) Labs Reviewed - No data to display  EKG None  Radiology No results found.  Procedures Procedures (including critical care time)  Medications Ordered in ED Medications  0.9 %   sodium chloride infusion (500 mLs Intravenous New Bag/Given 01/20/20 0332)  ketorolac (TORADOL) 15 MG/ML injection 15 mg (15 mg Intravenous Given 01/20/20 0333)  metoCLOPramide (REGLAN) injection 10 mg (10 mg Intravenous Given 01/20/20 0334)  diphenhydrAMINE (BENADRYL) injection 25 mg (25 mg Intravenous Given 01/20/20 01/22/20)    ED Course  I have reviewed the  triage vital signs and the nursing notes.  Pertinent labs & imaging results that were available during my care of the patient were reviewed by me and considered in my medical decision making (see chart for details).    MDM Rules/Calculators/A&P                           Patient presents with headache.  She is overall nontoxic and vital signs are reassuring.  Denies infectious symptoms.  Denies worst headache of her life.  Doubt subarachnoid hemorrhage.  She reports seizure activity yesterday but states that this is not uncommon for her and she is not taking her seizure medications.  Patient was given a migraine cocktail including Toradol, Reglan, and Benadryl.  She is nonfocal on exam.  Do not feel she needs imaging at this time.  On recheck, she states her headache is completely improved.  Discussed with patient that she needs to avoid driving given her ongoing seizure activity and she needs to follow-up closely with her neurologist.  After history, exam, and medical workup I feel the patient has been appropriately medically screened and is safe for discharge home. Pertinent diagnoses were discussed with the patient. Patient was given return precautions.   Final Clinical Impression(s) / ED Diagnoses Final diagnoses:  Migraine without status migrainosus, not intractable, unspecified migraine type    Rx / DC Orders ED Discharge Orders    None       Wilkie Aye, Mayer Masker, MD 01/20/20 [phone]

## 2020-03-25 ENCOUNTER — Emergency Department (HOSPITAL_COMMUNITY)
Admission: EM | Admit: 2020-03-25 | Discharge: 2020-03-25 | Disposition: A | Discharge status: Home / Self Care | Payer: Medicaid Other | Attending: Emergency Medicine | Admitting: Emergency Medicine

## 2020-03-25 ENCOUNTER — Other Ambulatory Visit: Payer: Self-pay

## 2020-03-25 ENCOUNTER — Emergency Department (HOSPITAL_COMMUNITY): Payer: Medicaid Other

## 2020-03-25 ENCOUNTER — Ambulatory Visit (HOSPITAL_COMMUNITY)
Admission: EM | Admit: 2020-03-25 | Discharge: 2020-03-25 | Disposition: A | Discharge status: Home / Self Care | Payer: Medicaid Other | Attending: Emergency Medicine | Admitting: Emergency Medicine

## 2020-03-25 ENCOUNTER — Encounter (HOSPITAL_COMMUNITY): Payer: Self-pay

## 2020-03-25 DIAGNOSIS — Z91040 Latex allergy status: Secondary | ICD-10-CM | POA: Insufficient documentation

## 2020-03-25 DIAGNOSIS — R202 Paresthesia of skin: Secondary | ICD-10-CM

## 2020-03-25 DIAGNOSIS — J45909 Unspecified asthma, uncomplicated: Secondary | ICD-10-CM | POA: Insufficient documentation

## 2020-03-25 DIAGNOSIS — R29898 Other symptoms and signs involving the musculoskeletal system: Secondary | ICD-10-CM

## 2020-03-25 DIAGNOSIS — Z8616 Personal history of COVID-19: Secondary | ICD-10-CM | POA: Insufficient documentation

## 2020-03-25 LAB — COMPREHENSIVE METABOLIC PANEL
ALT: 17 U/L (ref 0–44)
AST: 16 U/L (ref 15–41)
Albumin: 3.4 g/dL — ABNORMAL LOW (ref 3.5–5.0)
Alkaline Phosphatase: 62 U/L (ref 38–126)
Anion gap: 10 (ref 5–15)
BUN: 8 mg/dL (ref 6–20)
CO2: 21 mmol/L — ABNORMAL LOW (ref 22–32)
Calcium: 9.3 mg/dL (ref 8.9–10.3)
Chloride: 106 mmol/L (ref 98–111)
Creatinine, Ser: 0.65 mg/dL (ref 0.44–1.00)
GFR, Estimated: >60 mL/min (ref >60)
Glucose, Bld: 91 mg/dL (ref 70–99)
Potassium: 4.1 mmol/L (ref 3.5–5.1)
Sodium: 137 mmol/L (ref 135–145)
Total Bilirubin: 0.4 mg/dL (ref 0.3–1.2)
Total Protein: 7.3 g/dL (ref 6.5–8.1)

## 2020-03-25 LAB — CBC
HCT: 36.7 % (ref 36.0–46.0)
Hemoglobin: 11.3 g/dL — ABNORMAL LOW (ref 12.0–15.0)
MCH: 26.4 pg (ref 26.0–34.0)
MCHC: 30.8 g/dL (ref 30.0–36.0)
MCV: 85.7 fL (ref 80.0–100.0)
Platelets: 306 10*3/uL (ref 150–400)
RBC: 4.28 MIL/uL (ref 3.87–5.11)
RDW: 13.8 % (ref 11.5–15.5)
WBC: 5.8 10*3/uL (ref 4.0–10.5)
nRBC: 0 % (ref 0.0–0.2)

## 2020-03-25 IMAGING — MR MR CERVICAL SPINE W/O CM
4 of 5 series · 19 of 48 positions shown · non-contrast
Comparison: None.

CLINICAL DATA: Right shoulder numbness and paresthesias

EXAM:
MRI HEAD WITHOUT CONTRAST
MRI CERVICAL SPINE WITHOUT CONTRAST
TECHNIQUE: Multiplanar, multiecho pulse sequences of the brain and surrounding
structures, and cervical spine, to include the craniocervical
junction and cervicothoracic junction, were obtained without
intravenous contrast.

[Series 10: T2 · sagittal · 3.0mm · 0.43mm/px · 5 of 16 slices shown (1 of 2)]
[im 1/16]
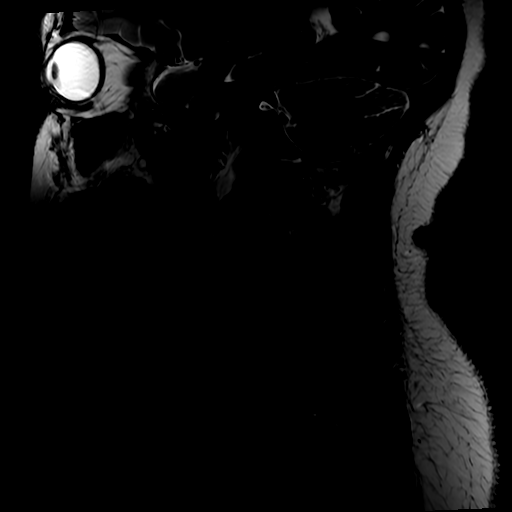
[im 4/16]
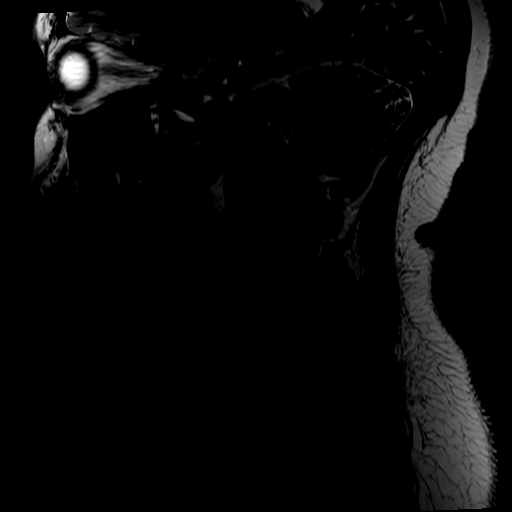
[im 8/16]
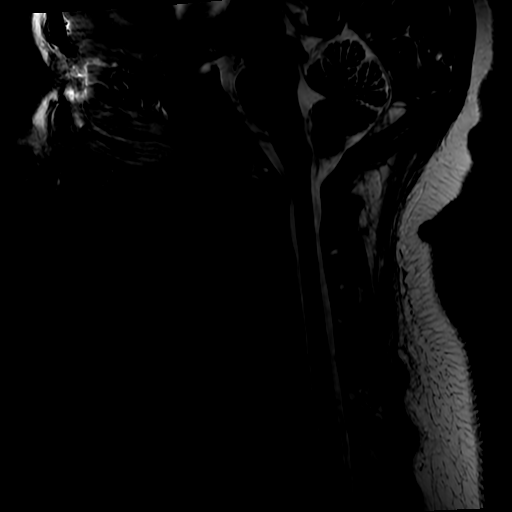
[im 12/16]
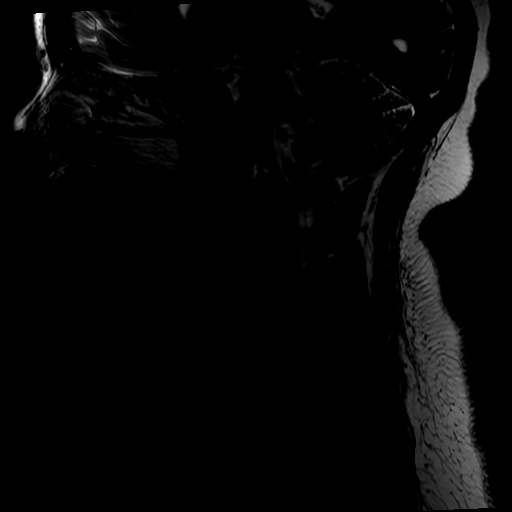
[im 16/16]
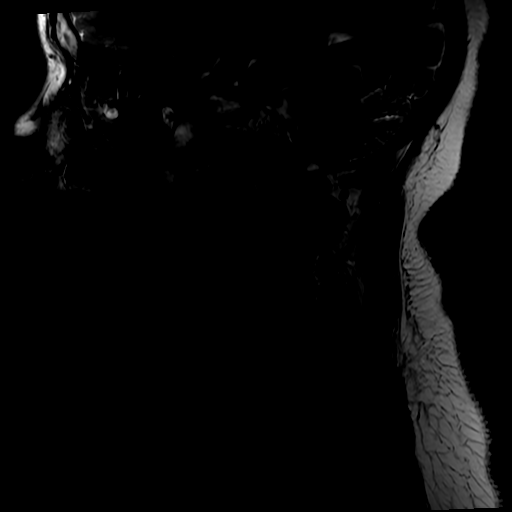

[Series 11: FLAIR · sagittal · 3.0mm · 0.43mm/px · 3 of 16 slices shown]
[im 1/16]
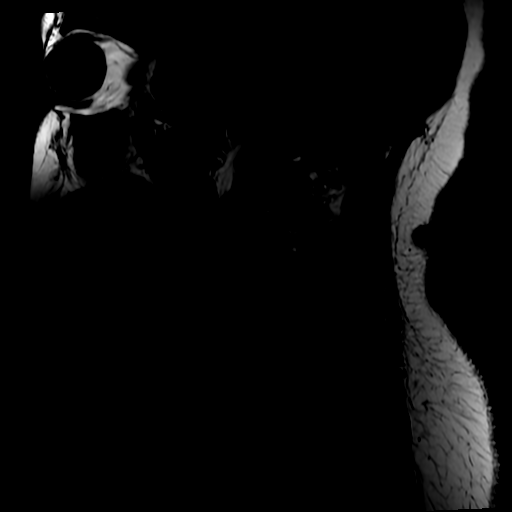
[im 11/16]
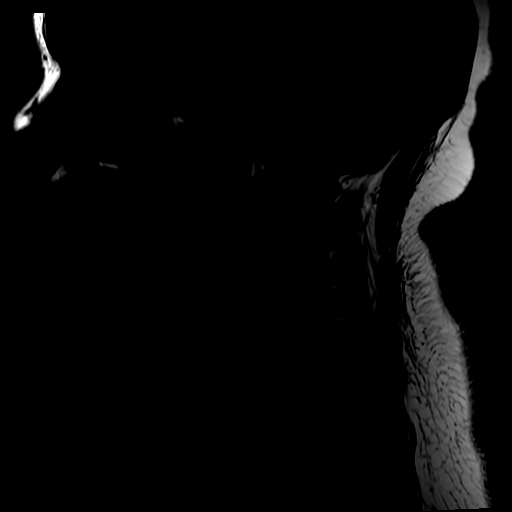
[im 16/16]
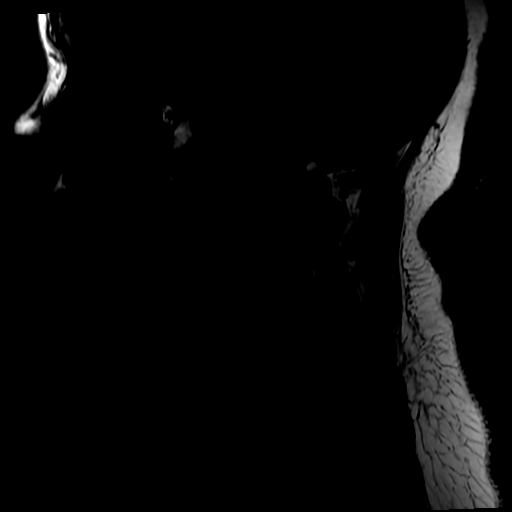

[Series 14: T2 · axial · 3.0mm · 0.35mm/px · z∈[-286,-204]mm · 8 of 32 slices shown (2 of 2)]
[im 1/32]
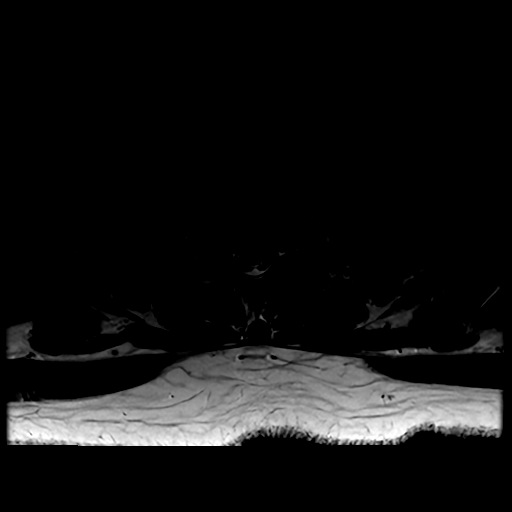
[im 4/32]
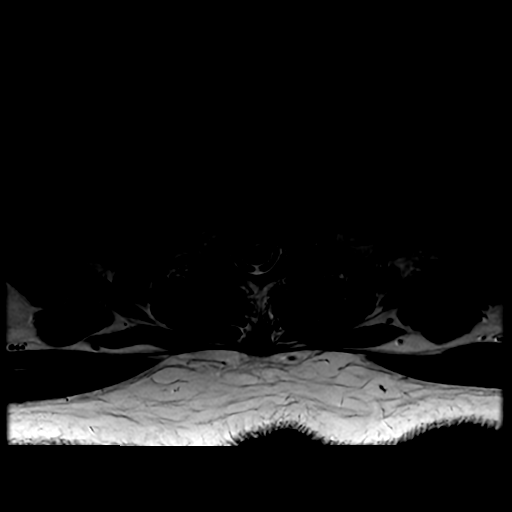
[im 8/32]
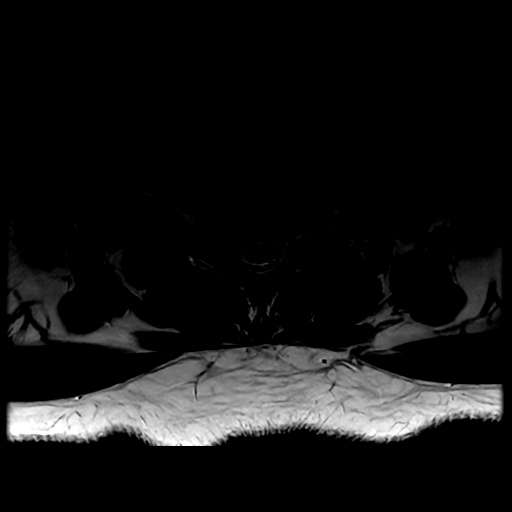
[im 12/32]
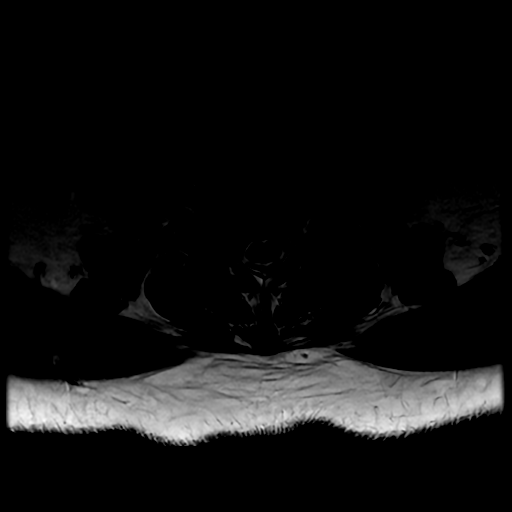
[im 16/32]
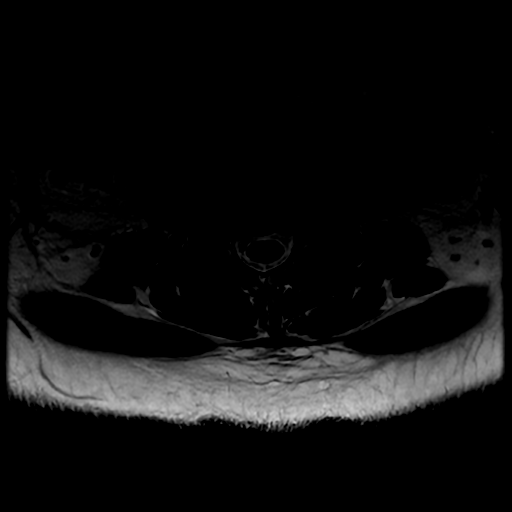
[im 20/32]
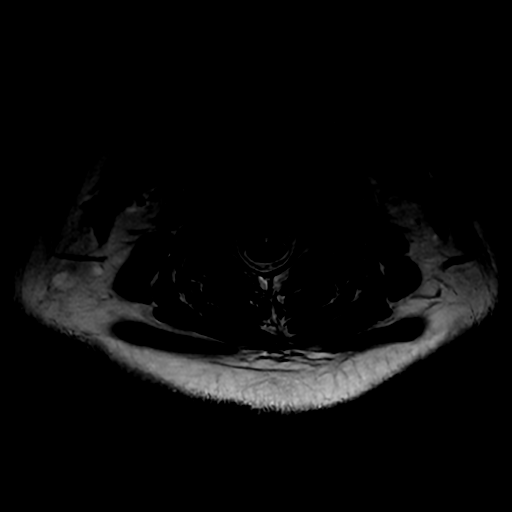
[im 24/32]
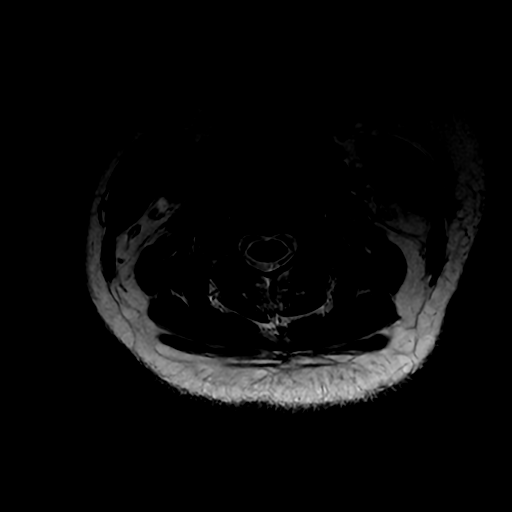
[im 28/32]
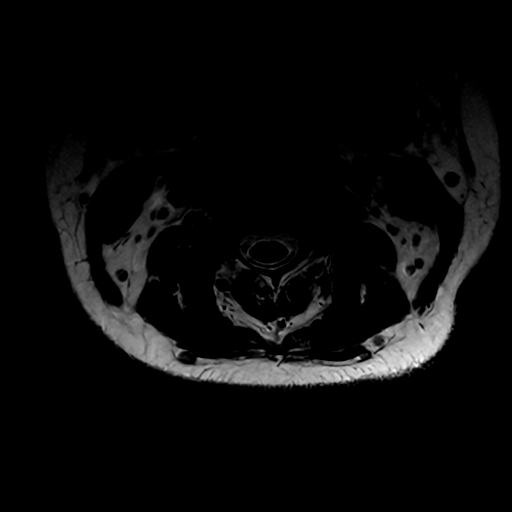

[Series 15: STIR · sagittal · 3.0mm · 0.43mm/px · 3 of 16 slices shown]
[im 1/16]
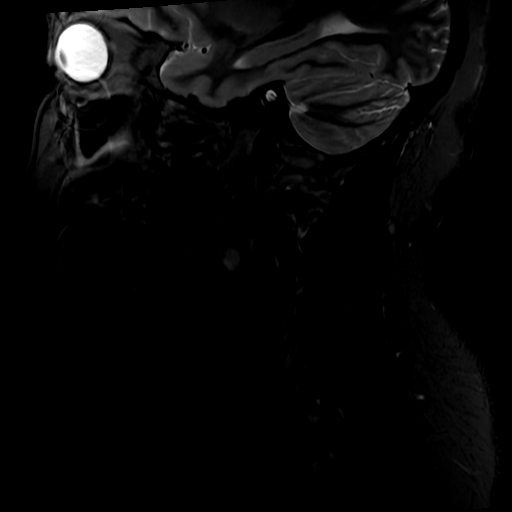
[im 11/16]
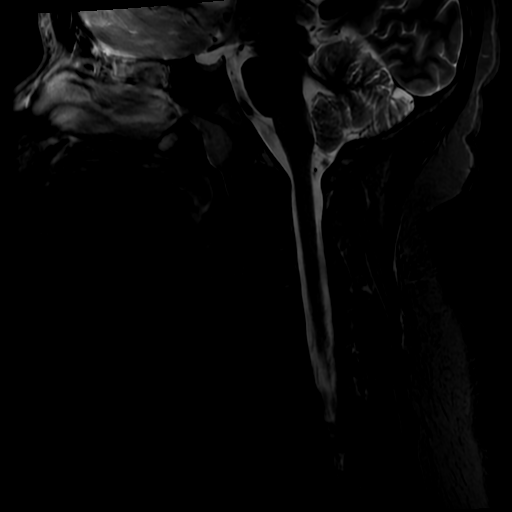
[im 16/16]
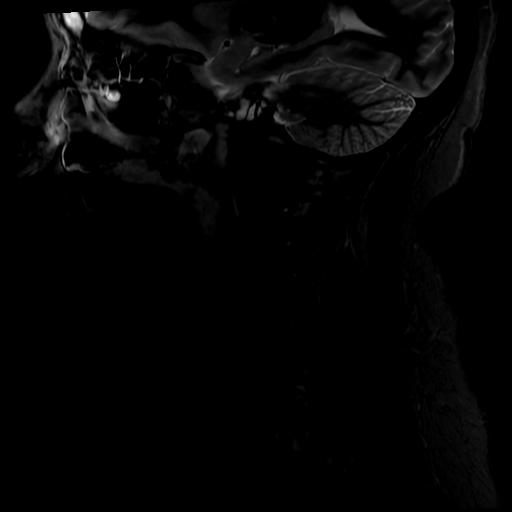

[19 of 48 positions shown; findings below may reference images not displayed]

FINDINGS: Patient declined contrast.

MRI HEAD FINDINGS

Brain: There is no acute infarction or intracranial hemorrhage.
There is no intracranial mass, mass effect, or edema. There is no
hydrocephalus or extra-axial fluid collection. Ventricles and sulci
are normal in size and configuration.

Vascular: Major vessel flow voids at the skull base are preserved.

Skull: Normal marrow signal is preserved.

Sinuses/Orbits: Opacified left frontal sinus. Otherwise minor
mucosal thickening. Orbits are unremarkable.

Other: Sella is unremarkable.  Mastoid air cells are clear.

MRI CERVICAL SPINE FINDINGS

Alignment: Preserved.

Vertebrae: Vertebral body heights are maintained. There is no marrow
edema or suspicious osseous lesion.

Cord: Normal caliber and signal.

Posterior Fossa, vertebral arteries, paraspinal tissues:
Unremarkable.

Disc levels: Intervertebral disc heights and signal are maintained.
There is no disc herniation or stenosis at any level.
IMPRESSION: No acute or significant abnormality.

## 2020-03-25 IMAGING — MR MR HEAD W/O CM
7 of 11 series · 25 of 48 positions shown · non-contrast
Comparison: None.

CLINICAL DATA: Right shoulder numbness and paresthesias

EXAM:
MRI HEAD WITHOUT CONTRAST
MRI CERVICAL SPINE WITHOUT CONTRAST
TECHNIQUE: Multiplanar, multiecho pulse sequences of the brain and surrounding
structures, and cervical spine, to include the craniocervical
junction and cervicothoracic junction, were obtained without
intravenous contrast.

[Series 2: DWI · axial · 3.0mm · 0.94mm/px · z∈[-188,-52]mm · 7 of 100 slices shown (1 of 2)]
[im 1/100]
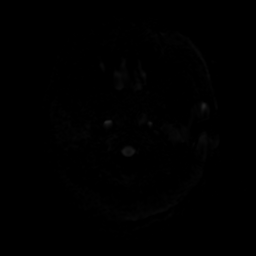
[im 17/100]
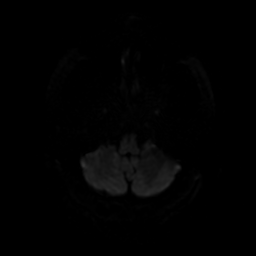
[im 34/100]
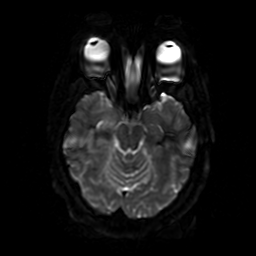
[im 50/100]
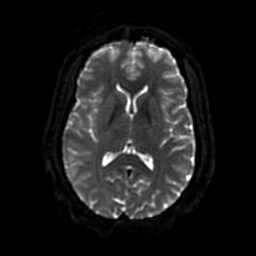
[im 67/100]
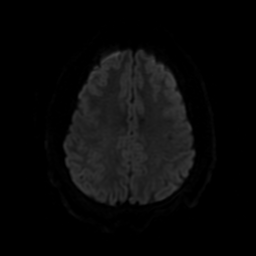
[im 83/100]
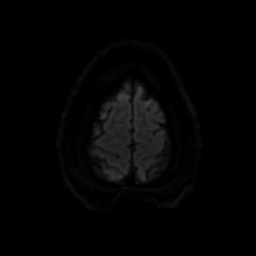
[im 100/100]
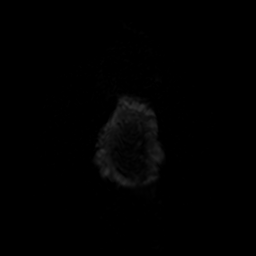

[Series 4: DWI · coronal · 4.0mm · 0.94mm/px · 6 of 74 slices shown (2 of 2)]
[im 1/74]
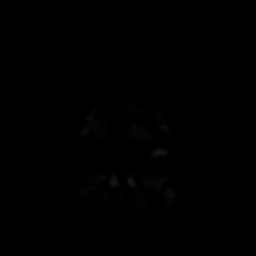
[im 15/74]
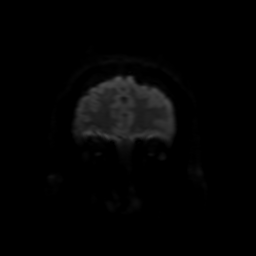
[im 30/74]
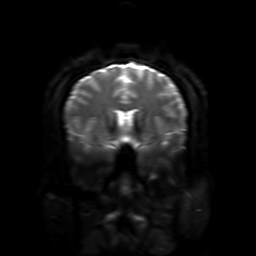
[im 44/74]
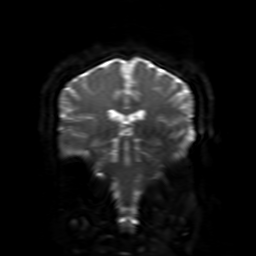
[im 59/74]
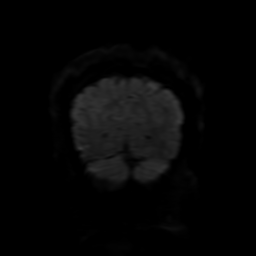
[im 74/74]
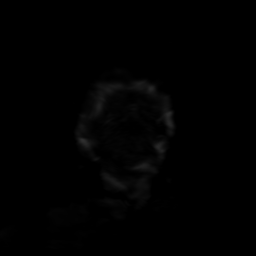

[Series 5: FLAIR · sagittal · 5.0mm · 0.23mm/px · 2 of 25 slices shown (1 of 2)]
[im 1/25]
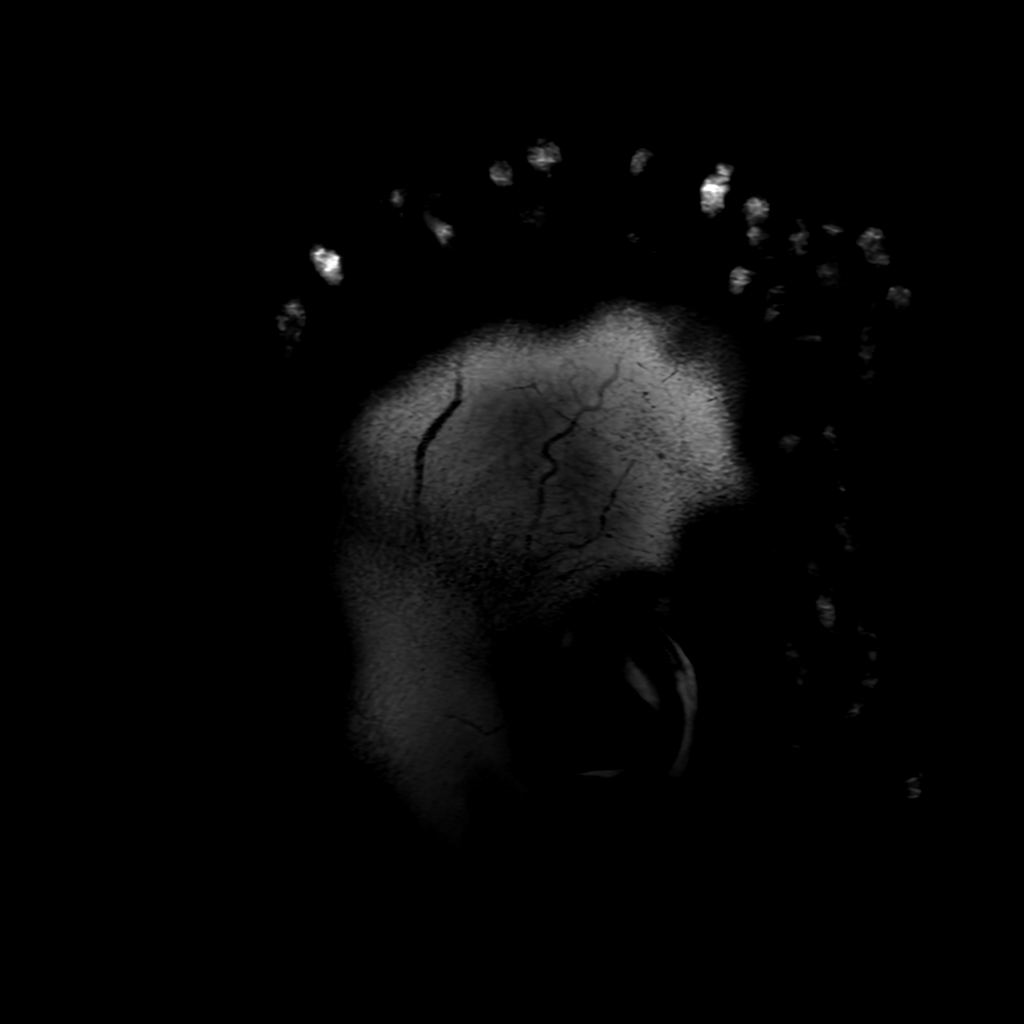
[im 25/25]
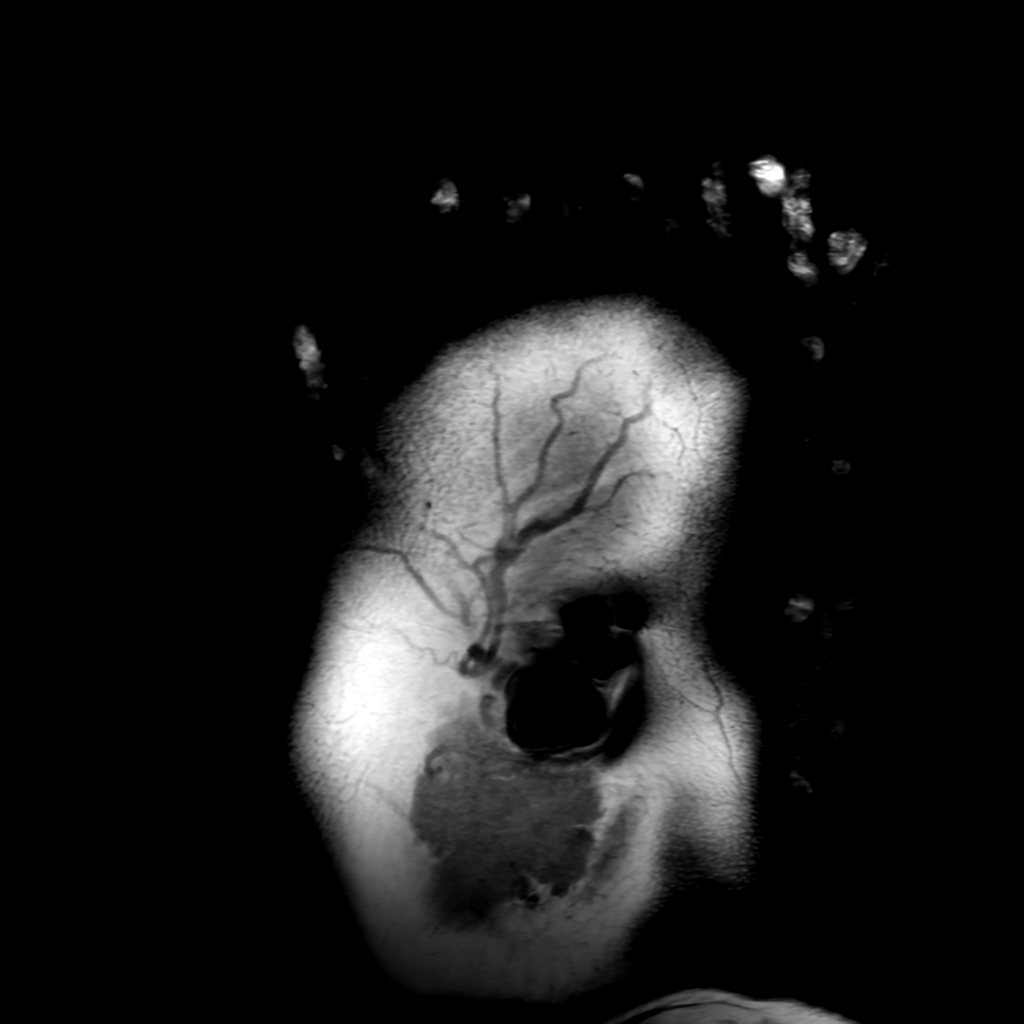

[Series 6: T2 · axial · 5.0mm · 0.23mm/px · 1 of 25 slices shown]
[im 1/25]
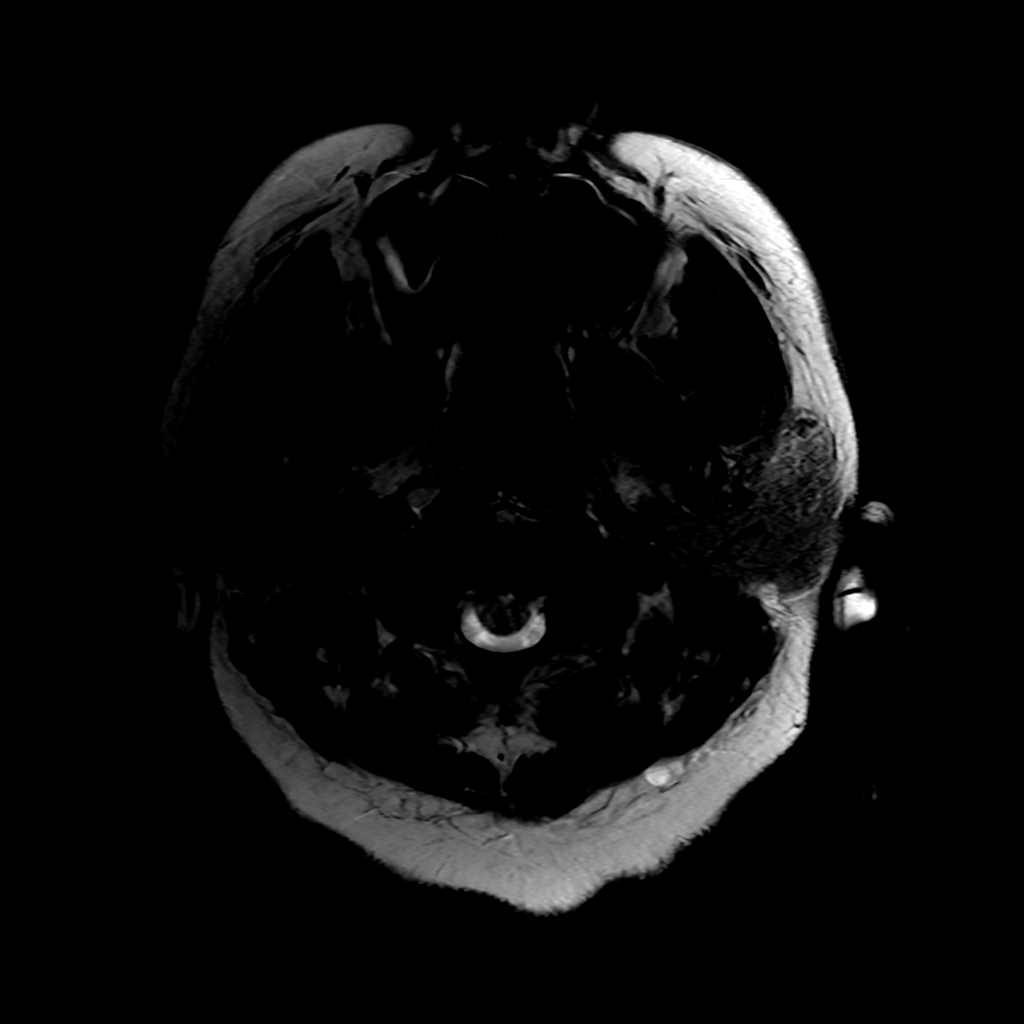

[Series 7: FLAIR · axial · 3.0mm · 0.45mm/px · z∈[-185,-51]mm · 2 of 25 slices shown (2 of 2)]
[im 1/25]
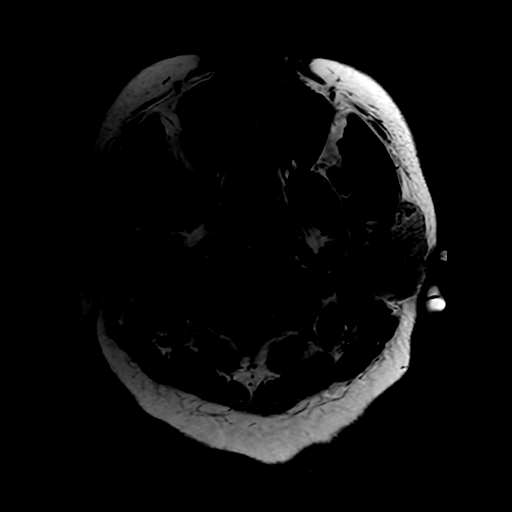
[im 25/25]
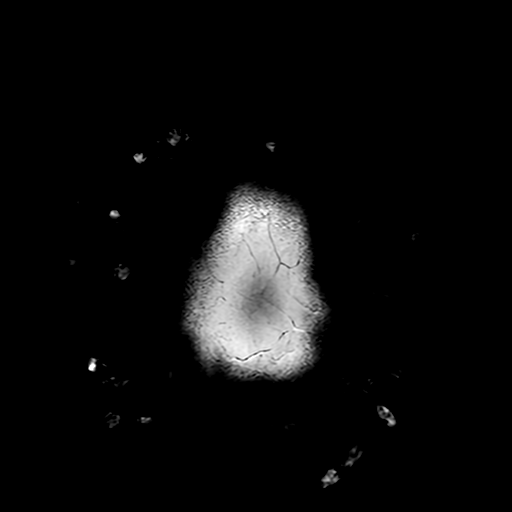

[Series 250: ADC · axial · 3.0mm · 0.94mm/px · z∈[-188,-52]mm · 4 of 50 slices shown (1 of 2)]
[im 1/50]
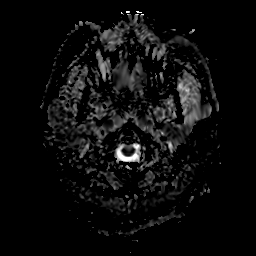
[im 17/50]
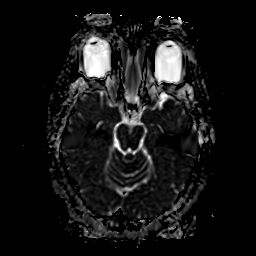
[im 33/50]
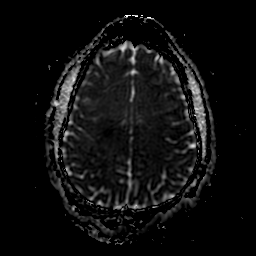
[im 50/50]
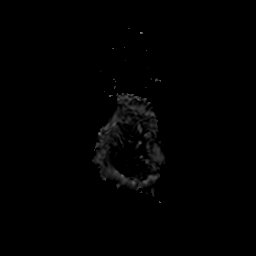

[Series 450: ADC · coronal · 4.0mm · 0.94mm/px · 3 of 37 slices shown (2 of 2)]
[im 1/37]
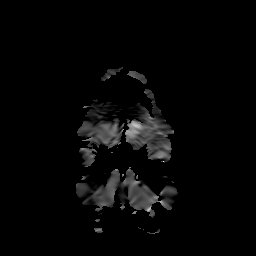
[im 19/37]
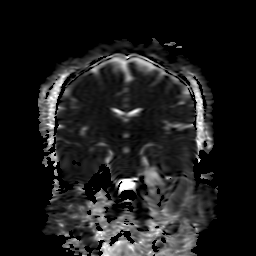
[im 37/37]
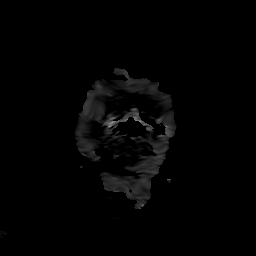

[25 of 48 positions shown; findings below may reference images not displayed]

FINDINGS: Patient declined contrast.

MRI HEAD FINDINGS

Brain: There is no acute infarction or intracranial hemorrhage.
There is no intracranial mass, mass effect, or edema. There is no
hydrocephalus or extra-axial fluid collection. Ventricles and sulci
are normal in size and configuration.

Vascular: Major vessel flow voids at the skull base are preserved.

Skull: Normal marrow signal is preserved.

Sinuses/Orbits: Opacified left frontal sinus. Otherwise minor
mucosal thickening. Orbits are unremarkable.

Other: Sella is unremarkable.  Mastoid air cells are clear.

MRI CERVICAL SPINE FINDINGS

Alignment: Preserved.

Vertebrae: Vertebral body heights are maintained. There is no marrow
edema or suspicious osseous lesion.

Cord: Normal caliber and signal.

Posterior Fossa, vertebral arteries, paraspinal tissues:
Unremarkable.

Disc levels: Intervertebral disc heights and signal are maintained.
There is no disc herniation or stenosis at any level.
IMPRESSION: No acute or significant abnormality.

## 2020-03-25 IMAGING — CR DG SHOULDER 2+V*R*
4 series · 4 of 4 positions shown · non-contrast
Comparison: Chest radiograph [DATE]

CLINICAL DATA: Right shoulder numbness and paresthesias.

EXAM:
RIGHT SHOULDER - 2+ VIEW

[shoulder grashey]
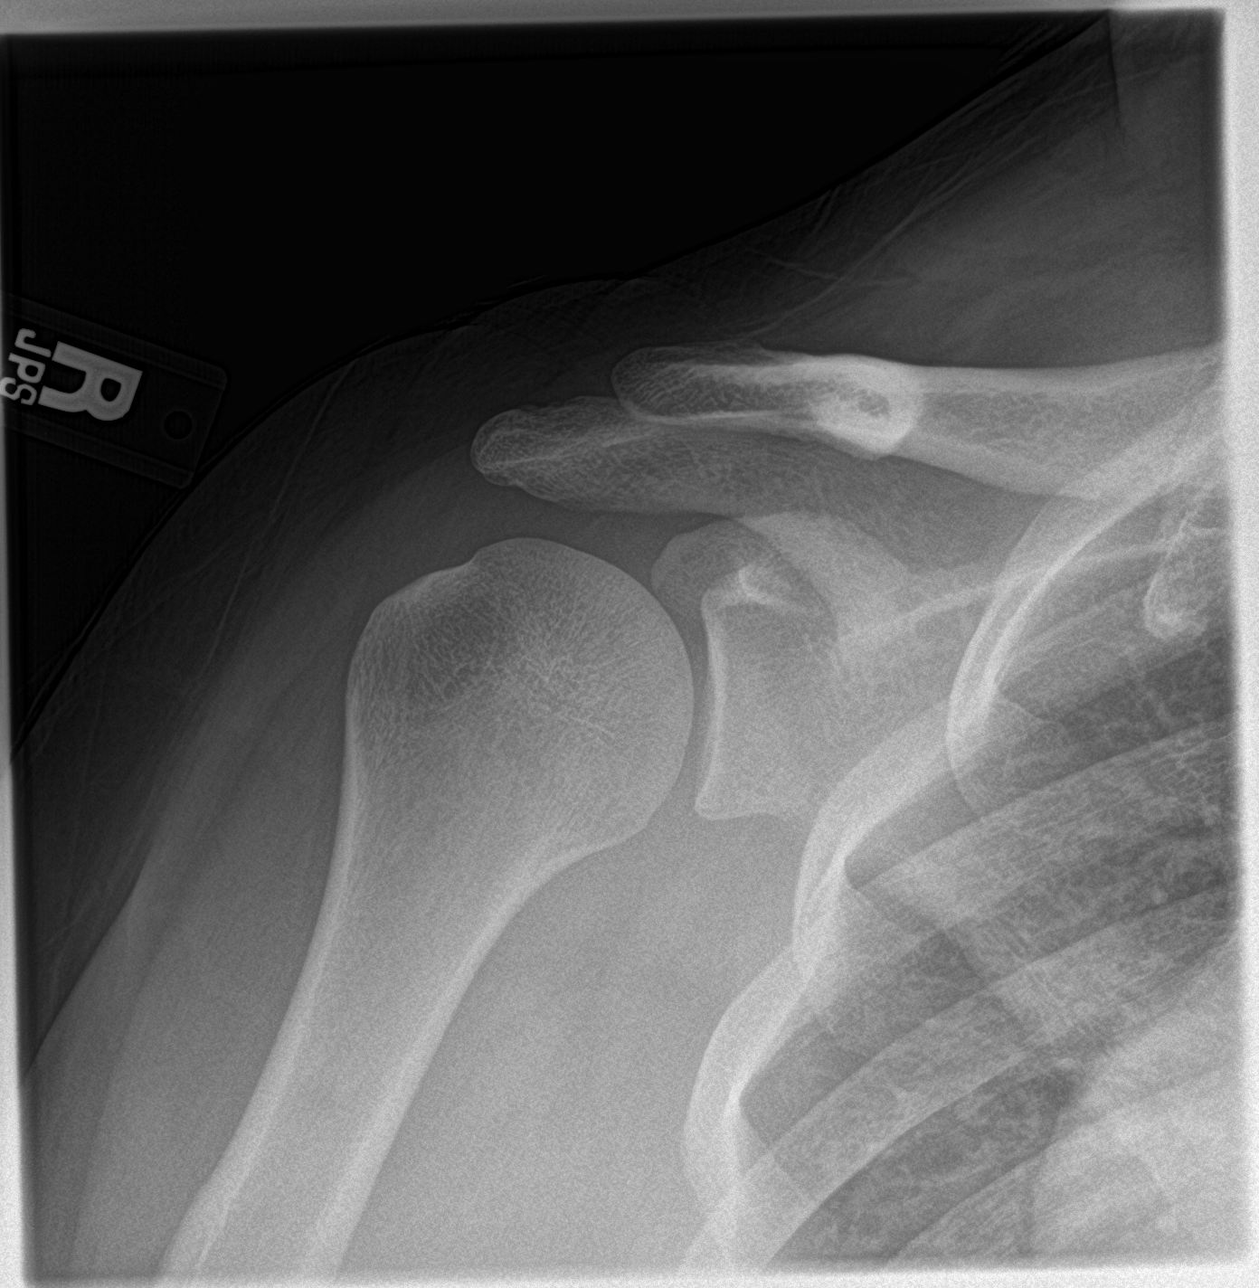

[shoulder axillary]
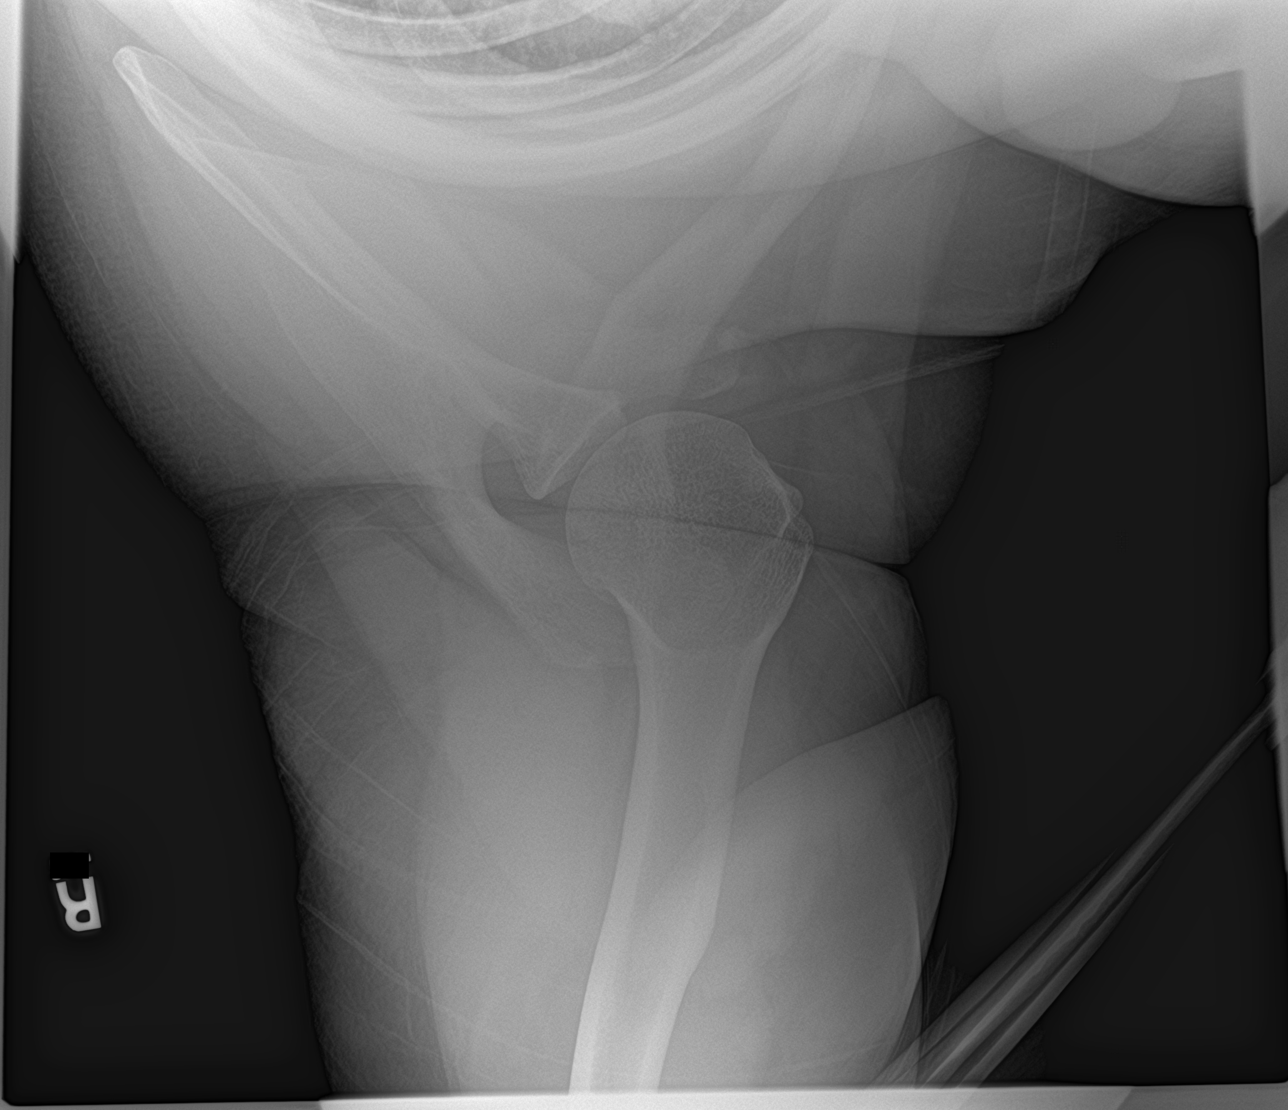

[shoulder ap neutral]
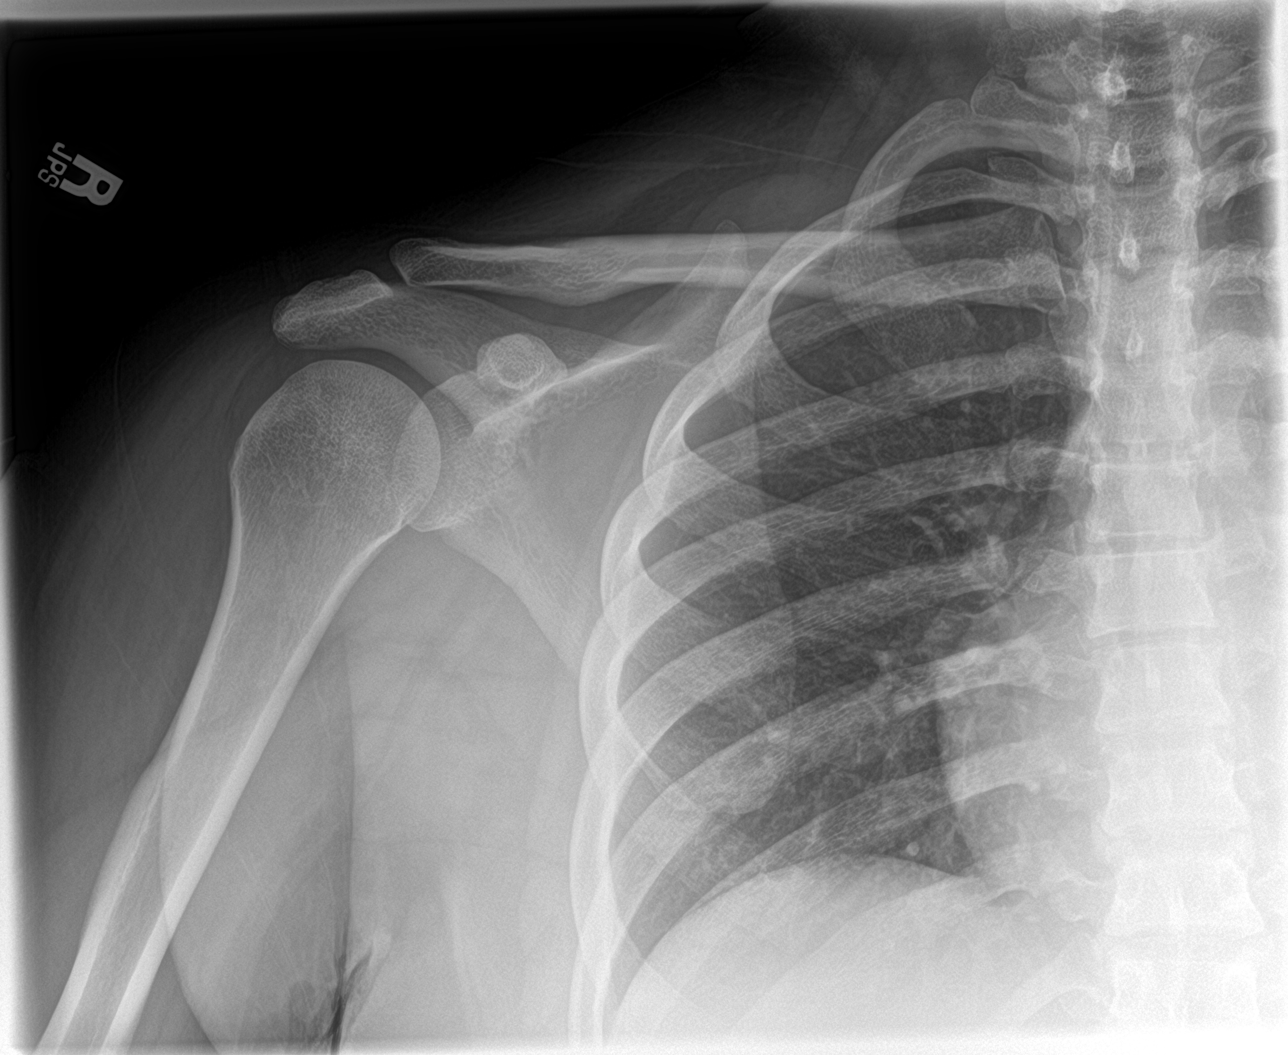

[shoulder y view]
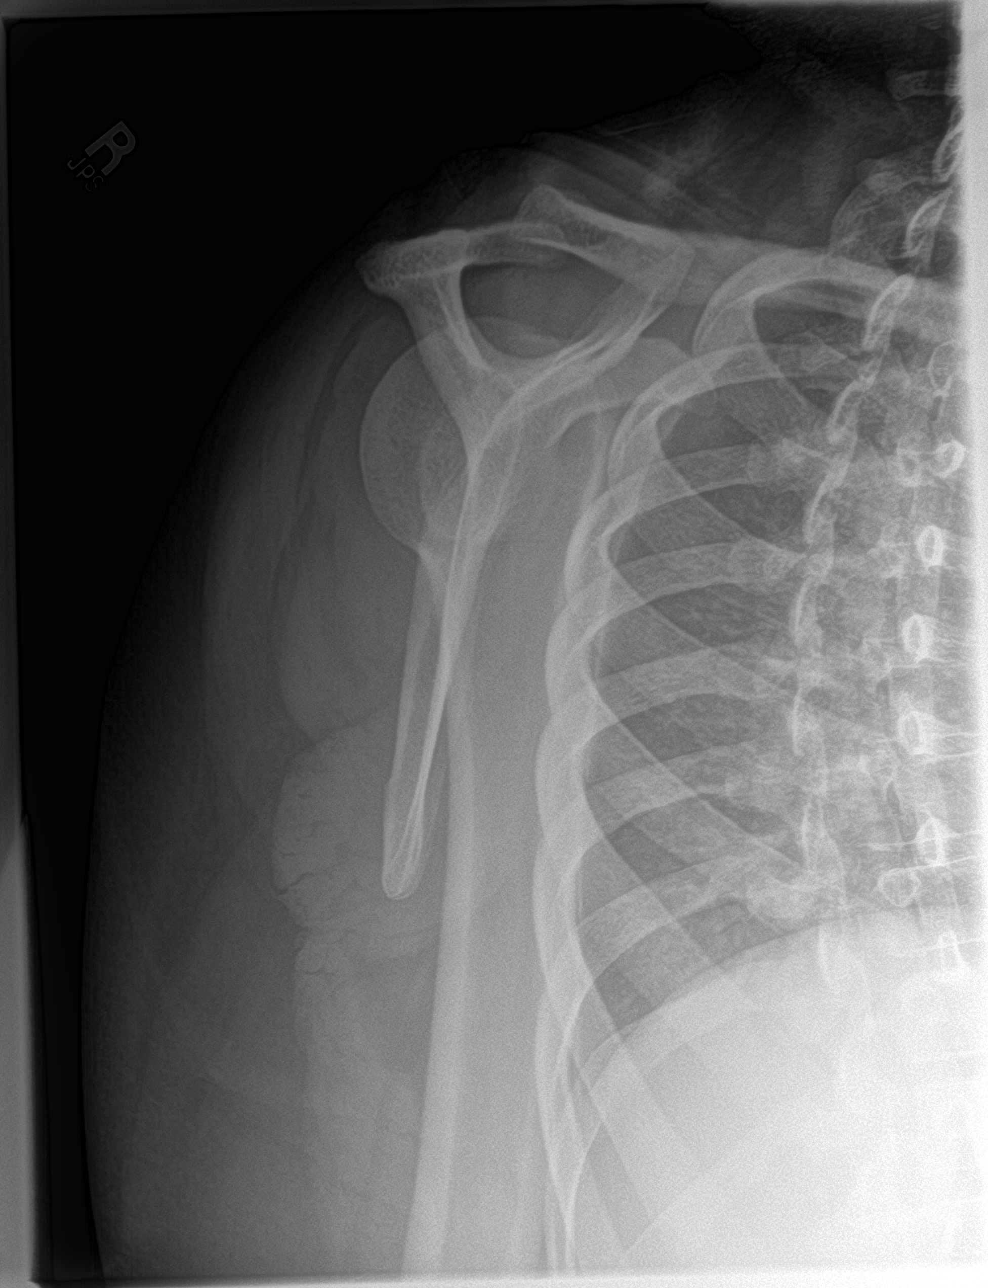

[4 of 4 positions shown; findings below may reference images not displayed]

FINDINGS: Right shoulder is located without a fracture. Visualized right ribs
are intact. Normal alignment at the right AC joint.
IMPRESSION: Normal shoulder radiographs.

## 2020-03-25 MED ORDER — LORAZEPAM 1 MG PO TABS: 0.5000 mg | ORAL_TABLET | Freq: Once | ORAL | Status: DC

## 2020-03-25 MED ORDER — LORAZEPAM 2 MG/ML IJ SOLN
0.5000 mg | Freq: Once | INTRAMUSCULAR | Status: AC
  Administered 2020-03-25: 0.5 mg via INTRAVENOUS
  Filled 2020-03-25: qty 1

## 2020-03-25 MED ORDER — DIPHENHYDRAMINE HCL 50 MG/ML IJ SOLN
25.0000 mg | Freq: Once | INTRAMUSCULAR | Status: AC
  Administered 2020-03-25: 25 mg via INTRAVENOUS
  Filled 2020-03-25: qty 1

## 2020-03-25 MED ORDER — SODIUM CHLORIDE 0.9 % IV BOLUS: 1000.0000 mL | Freq: Once | INTRAVENOUS | Status: DC

## 2020-03-25 MED ORDER — LORAZEPAM 2 MG/ML IJ SOLN: 1.0000 mg | Freq: Once | INTRAMUSCULAR | Status: DC

## 2020-03-25 MED ORDER — PROCHLORPERAZINE EDISYLATE 10 MG/2ML IJ SOLN
10.0000 mg | Freq: Once | INTRAMUSCULAR | Status: AC
  Administered 2020-03-25: 10 mg via INTRAVENOUS
  Filled 2020-03-25: qty 2

## 2020-03-25 NOTE — ED Triage Notes (Signed)
Pt c/o right arm numbness/pain onset last night at 1830 while leaving work. Also reports pain/discomfort aching in nature 10/10 that increases with shoulder rotation/abduction/adduction.  Reports she had COVID 3 weeks ago with mild symptoms. Had [place] vaccine prior to illness. On BCPs.  Does not take her lamictal for sz.  Denies trauma/injury to area, changes in vision/speech, weakness to right leg, CP, SOB, vomiting, diaphoresis, ataxia.   Smile symmetrical, right grip weak, negative arm drift, legs equal/strong. +2 radial pulse. Right arm weakness noted against resistance.   Dr. Chaney Malling notified of pt status.

## 2020-03-25 NOTE — ED Provider Notes (Signed)
HPI  SUBJECTIVE:  Karen Simon is a 29 y.o. female who presents with the acute onset of right shoulder numbness/paresthesias described as "pins-and-needles" and "like my arm being asleep" that has gone down her right entire arm starting at 1800 last night. She states it is getting worse. She reports arm weakness because it makes the paresthesias worse. She reports grip weakness. She denies headache, nausea, vomiting, blurry vision, double vision, slurred speech, facial droop, leg weakness, discoordination, neck pain, chest pain, palpitations, preceding seizure. No trauma to the neck or shoulder. No change in physical activity. She is right-handed. She has never had symptoms like this before. She tried Tylenol and a muscle rub without improvement in her symptoms. She states that her symptoms are worse with movement and with lying on it. She has a past medical history of migraines, absent seizures, noncompliant with Lamictal. States that she never started it. No history of atrial fibrillation, coronary disease, hypercholesterolemia, stroke, C-spine arthritis. LMP: 1 and half months ago. She is irregular. Denies possibility of being pregnant. PMD: None.    Past Medical History:  Diagnosis Date  . Anemia   . Asthma   . Enlarged heart   . H/O candidiasis   . H/O varicella   . Irregular periods/menstrual cycles 07/01/09  . Migraines   . Morbid obesity (HCC) 09/17/2015  . Seizure-like activity (HCC)   . Seizures (HCC)     Past Surgical History:  Procedure Laterality Date  . CHOLECYSTECTOMY  03/20/2012   Procedure: LAPAROSCOPIC CHOLECYSTECTOMY;  Surgeon: Shelly Rubenstein, MD;  Location: [facility];  Service: General;  Laterality: N/A;    Family History  Problem Relation Age of Onset  . Hypertension Mother   . Cancer Maternal Grandmother        breast  . Cancer Maternal Grandfather        colon  . Seizures Father   . Sleep apnea Father   . Seizures Paternal Aunt        x 3 aunts  .  Anesthesia problems Neg Hx     Social History   Tobacco Use  . Smoking status: Never Smoker  . Smokeless tobacco: Never Used  Substance Use Topics  . Alcohol use: Never  . Drug use: No    No current facility-administered medications for this encounter.  Current Outpatient Medications:  .  JUNEL FE 1/20 1-20 MG-MCG tablet, Take by mouth., Disp: , Rfl:  .  lamoTRIgine (LAMICTAL) 25 MG tablet, 1 tablet twice a day for the first week 2 tablets twice a day for the second week 3 tablets twice a day for the third week 4 tablets twice a day for the fourth week  For total of 140 tablets  After finish titration with small dose of lamotrigine 25 mg, change to lamotrigine 100 mg twice a day, Disp: 140 tablet, Rfl: 0 .  Multiple Vitamin (MULTIVITAMIN) tablet, Take 1 tablet by mouth daily., Disp: , Rfl:   Allergies  Allergen Reactions  . Penicillins Anaphylaxis, Itching and Swelling  . Iodine Hives  . Latex Hives  . Shellfish Allergy Hives  . Tomato Hives and Swelling     ROS  As noted in HPI.   Physical Exam  BP (!) 148/97 (BP Location: Left Wrist)   Pulse 86   Temp 98.4 F (36.9 C) (Oral)   Resp 18   SpO2 98%   Constitutional: Well developed, well nourished, no acute distress Eyes:  EOMI, conjunctiva normal bilaterally HENT: Normocephalic, atraumatic,mucus membranes  moist Respiratory: Normal inspiratory effort Cardiovascular: Normal rate GI: nondistended skin: No rash, skin intact Musculoskeletal: no deformities RP 2+ and equal bilaterally. Neurologic: Alert & oriented x 3, weakness with right shoulder shrug. Cranial nerves III through XII otherwise intact. No facial droop. Speech normal. Patient has difficulty holding arm up for 10 seconds but no definite pronator drift. Weakness with forward flexion against resistance. Grip weakness on the right. Sensation to temperature, light touch, sharp and soft intact over the entire neck and arm. Brachioradialis reflex 1/2 but equal  bilaterally. Patellar reflex 1/2 but equal bilaterally. Normal finger-nose. Tandem gait steady. Romberg negative. Psychiatric: Speech and behavior appropriate   ED Course   Medications - No data to display  No orders of the defined types were placed in this encounter.   No results found for this or any previous visit (from the past 24 hour(s)). No results found.  ED Clinical Impression  1. Right arm weakness   2. Paresthesia of arm      ED Assessment/Plan  Patient with right arm paresthesias/numbness starting acutely last night. She has some focal neurologic deficits, but sensation is intact. This started at 1800 last night. If this is a stroke, she is out of the window for TPA. In the differential is multiple sclerosis. Doubt complex migraine because she has no headache. She denies any preceding seizure activity. Denies any neck pain. Transferring to the emergency department for comprehensive work-up and possible neurology evaluation. Her vitals are normal, feel that she is stable to go via private vehicle. Discussed medical decision-making, rationale for transfer to the emergency department, emphasized the importance of going immediately to the emergency department. She agrees to go.   No orders of the defined types were placed in this encounter.   *This clinic note was created using Dragon dictation software. Therefore, there may be occasional mistakes despite careful proofreading.   ?    Domenick Gong, MD 03/25/20 [phone]

## 2020-03-25 NOTE — ED Notes (Signed)
Pt transported to MRI 

## 2020-03-25 NOTE — Discharge Instructions (Addendum)
Immediately to the [facility] emergency department. This could be something like multiple sclerosis or even a small stroke. Let them know if your symptoms change.

## 2020-03-25 NOTE — ED Triage Notes (Signed)
Pt here from [facility] with right shoulder pain , pt was sent down to r/o several things including MS and stroke, pt is not have stroke symptoms at present

## 2020-03-25 NOTE — ED Notes (Signed)
Pt d/c home per MD order. Discharge summary reviewed with pt, pt verbalizes understanding. No s/s of acute distress noted at discharge. Ambulatory off unit.  

## 2020-03-25 NOTE — ED Provider Notes (Signed)
[facility] [address] [facility] EMERGENCY DEPARTMENT Provider Note   CSN: [id number] Arrival date & time: 03/25/20  2800     History No chief complaint on file.   Karen Simon is a 29 y.o. female possible history of anemia, enlarged heart, migraines, seizures, who presents for evaluation of right upper extremity numbness/paresthesias that has been ongoing since last night.  She reports that about 6 PM last night, she started getting a weird sensation in her right shoulder.  She described it almost like a soreness and then a numbness.  She states that it started going down her entire right upper extremity.  She states she feels like "when you slept on your arm and it has that tingly sensation or you have lost circulation."  She states she can still feel but that her arm just feels "off."  She does not any trouble moving it denies any weakness.  No previous trauma, injury.  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  No known injury prior to onset of symptoms.  She has not noticed any redness or swelling of the arm.  She states she had Covid about 3 weeks ago but otherwise has been in her normal state of health.  She went to urgent care for evaluation and was sent to the ED for further evaluation.  She reports history of seizures but is not on any medication.  She denies any vision changes, difficulty speaking, chest pain, neck pain, difficulty breathing, abdominal pain, nausea/vomiting, numbness/weakness of her lower extremities, difficulty ambulating.  She states her dad has a history of seizures.  No other family history of neurological disorders.  The history is provided by the patient.       Past Medical History:  Diagnosis Date  . Anemia   . Asthma   . Enlarged heart   . H/O candidiasis   . H/O varicella   . Irregular periods/menstrual cycles 07/01/09  . Migraines   . Morbid obesity (HCC) 09/17/2015  . Seizure-like activity (HCC)   . Seizures [facility])       Patient Active Problem List   Diagnosis Date Noted  . Partial symptomatic epilepsy with complex partial seizures, not intractable, without status epilepticus (HCC) 04/11/2019  . Morbid obesity (HCC) 09/17/2015  . Normal labor 02/23/2014  . Lactating mother 11/27/2011  . Teen pregnancy 11/27/2011  . Pregnant state, incidental 11/25/2011  . Vaginal delivery 11/25/2011  . Perineal laceration complicating delivery 11/25/2011  . Candida albicans infection 11/15/2011  . Excess weight gain in pregnancy 11/07/2011  . Anemia in pregnancy 09/14/2011  . History of high blood pressure - never took meds 04/12/2011  . 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 History of anemia 11/26/2010  . Asthma 11/26/2010  . Migraine 11/26/2010    Past Surgical History:  Procedure Laterality Date  . CHOLECYSTECTOMY  03/20/2012   Procedure: LAPAROSCOPIC CHOLECYSTECTOMY;  Surgeon: Shelly Rubenstein, MD;  Location: [facility];  Service: General;  Laterality: N/A;     OB History    Gravida  2   Para  2   Term  2   Preterm  0   AB  0   Living  2     SAB  0   TAB  0   Ectopic  0   Multiple  0   Live Births  2           Family History  Problem Relation Age of Onset  .  Hypertension Mother   . Cancer Maternal Grandmother        breast  . Cancer Maternal Grandfather        colon  . Seizures Father   . Sleep apnea Father   . Seizures Paternal Aunt        x 3 aunts  . Anesthesia problems Neg Hx     Social History   Tobacco Use  . Smoking status: Never Smoker  . Smokeless tobacco: Never Used  Substance Use Topics  . Alcohol use: Never  . Drug use: No    Home Medications Prior to Admission medications   Medication Sig Start Date End Date Taking? Authorizing Provider  JUNEL FE 1/20 1-20 MG-MCG tablet Take 1 tablet by mouth daily.  01/20/20  Yes [provider]  Multiple Vitamin (MULTIVITAMIN) tablet Take 1 tablet by mouth daily.   Yes [provider]  lamoTRIgine (LAMICTAL) 25 MG tablet 1 tablet twice a day for the first week 2 tablets twice a day for the second week 3 tablets twice a day for the third week 4 tablets twice a day for the fourth week  For total of 140 tablets  After finish titration with small dose of lamotrigine 25 mg, change to lamotrigine 100 mg twice a day Patient not taking: Reported on 03/25/2020 08/15/19   Glean Salvo, NP    Allergies    Penicillins, Iodine, Latex, Shellfish allergy, and Tomato  Review of Systems   Review of Systems  Constitutional: Negative for fever.  Respiratory: Negative for cough and shortness of breath.   Cardiovascular: Negative for chest pain.  Gastrointestinal: Negative for abdominal pain, nausea and vomiting.  Genitourinary: Negative for dysuria and hematuria.  Neurological: Positive for numbness. Negative for weakness and headaches.  All other systems reviewed and are negative.   Physical Exam Updated Vital Signs BP (!) 108/52   Pulse 81   Temp 97.8 F (36.6 C) (Oral)   Resp 16   SpO2 100%   Physical Exam Vitals and nursing note reviewed.  Constitutional:      Appearance: Normal appearance. She is well-developed.  HENT:     Head: Normocephalic and atraumatic.  Eyes:     General: Lids are normal.     Conjunctiva/sclera: Conjunctivae normal.     Pupils: Pupils are equal, round, and reactive to light.  Neck:     Comments: Full flexion/extension and lateral movement of neck fully intact. No bony midline tenderness. No deformities or crepitus. Negative Spurling maneuver.  Cardiovascular:     Rate and Rhythm: Normal rate and regular rhythm.     Pulses: Normal pulses.          Radial pulses are 2+ on the right side and 2+ on the left side.     Heart sounds: Normal heart sounds. No murmur heard.  No friction rub. No gallop.   Pulmonary:     Effort: Pulmonary effort is normal.     Breath sounds: Normal breath sounds.     Comments: Lungs clear to  auscultation bilaterally.  Symmetric chest rise.  No wheezing, rales, rhonchi. Abdominal:     Palpations: Abdomen is soft. Abdomen is not rigid.     Tenderness: There is no abdominal tenderness. There is no guarding.     Comments: Abdomen is soft, non-distended, non-tender. No rigidity, No guarding. No peritoneal signs.  Musculoskeletal:        General: Normal range of motion.     Cervical back: Full  passive range of motion without pain.  Skin:    General: Skin is warm and dry.     Capillary Refill: Capillary refill takes less than 2 seconds.  Neurological:     Mental Status: She is alert and oriented to person, place, and time.     Comments: Cranial nerves III-XII intact Follows commands, Moves all extremities  5/5 strength to LUE and BLE. 4/5 strength of RUE 4/5 strength of the RUE when assessing elbow flexion/extension. Slight unequal grip strength (diminished on the right).  Sensation intact throughout all major nerve distributions. She reports she can feel on her RUE but that it just feels "tingly."  No pronator drift but has difficulty maintaining strength of RUE against resistance.  No gait abnormalities  No slurred speech. No facial droop.   Psychiatric:        Speech: Speech normal.     ED Results / Procedures / Treatments   Labs (all labs ordered are listed, but only abnormal results are displayed) Labs Reviewed  CBC - Abnormal; Notable for the following components:      Result Value   Hemoglobin 11.3 (*)    All other components within normal limits  COMPREHENSIVE METABOLIC PANEL - Abnormal; Notable for the following components:   CO2 21 (*)    Albumin 3.4 (*)    All other components within normal limits    EKG None  Radiology DG Shoulder Right  Result Date: 03/25/2020 CLINICAL DATA:  Right shoulder numbness and paresthesias. EXAM: RIGHT SHOULDER - 2+ VIEW COMPARISON:  Chest radiograph 07/04/2018 FINDINGS: Right shoulder is located without a fracture.  Visualized right ribs are intact. Normal alignment at the right [facility] joint. IMPRESSION: Normal shoulder radiographs. Electronically Signed   By: Richarda Overlie M.D.   On: 03/25/2020 10:03   MR BRAIN WO CONTRAST  Result Date: 03/25/2020 CLINICAL DATA:  Right shoulder numbness and paresthesias EXAM: MRI HEAD WITHOUT CONTRAST MRI CERVICAL SPINE WITHOUT CONTRAST TECHNIQUE: Multiplanar, multiecho pulse sequences of the brain and surrounding structures, and cervical spine, to include the craniocervical junction and cervicothoracic junction, were obtained without intravenous contrast. COMPARISON:  None. FINDINGS: Patient declined contrast. MRI HEAD FINDINGS Brain: There is no acute infarction or intracranial hemorrhage. There is no intracranial mass, mass effect, or edema. There is no hydrocephalus or extra-axial fluid collection. Ventricles and sulci are normal in size and configuration. Vascular: Major vessel flow voids at the skull base are preserved. Skull: Normal marrow signal is preserved. Sinuses/Orbits: Opacified left frontal sinus. Otherwise minor mucosal thickening. Orbits are unremarkable. Other: Sella is unremarkable.  Mastoid air cells are clear. MRI CERVICAL SPINE FINDINGS Alignment: Preserved. Vertebrae: Vertebral body heights are maintained. There is no marrow edema or suspicious osseous lesion. Cord: Normal caliber and signal. Posterior Fossa, vertebral arteries, paraspinal tissues: Unremarkable. Disc levels: Intervertebral disc heights and signal are maintained. There is no disc herniation or stenosis at any level. IMPRESSION: No acute or significant abnormality. Electronically Signed   By: Guadlupe Spanish M.D.   On: 03/25/2020 12:45   MR CERVICAL SPINE WO CONTRAST  Result Date: 03/25/2020 CLINICAL DATA:  Right shoulder numbness and paresthesias EXAM: MRI HEAD WITHOUT CONTRAST MRI CERVICAL SPINE WITHOUT CONTRAST TECHNIQUE: Multiplanar, multiecho pulse sequences of the brain and surrounding structures,  and cervical spine, to include the craniocervical junction and cervicothoracic junction, were obtained without intravenous contrast. COMPARISON:  None. FINDINGS: Patient declined contrast. MRI HEAD FINDINGS Brain: There is no acute infarction or intracranial hemorrhage. There is no intracranial mass,  mass effect, or edema. There is no hydrocephalus or extra-axial fluid collection. Ventricles and sulci are normal in size and configuration. Vascular: Major vessel flow voids at the skull base are preserved. Skull: Normal marrow signal is preserved. Sinuses/Orbits: Opacified left frontal sinus. Otherwise minor mucosal thickening. Orbits are unremarkable. Other: Sella is unremarkable.  Mastoid air cells are clear. MRI CERVICAL SPINE FINDINGS Alignment: Preserved. Vertebrae: Vertebral body heights are maintained. There is no marrow edema or suspicious osseous lesion. Cord: Normal caliber and signal. Posterior Fossa, vertebral arteries, paraspinal tissues: Unremarkable. Disc levels: Intervertebral disc heights and signal are maintained. There is no disc herniation or stenosis at any level. IMPRESSION: No acute or significant abnormality. Electronically Signed   By: Guadlupe Spanish M.D.   On: 03/25/2020 12:45    Procedures Procedures (including critical care time)  Medications Ordered in ED Medications  LORazepam (ATIVAN) tablet 0.5 mg (0.5 mg Oral Not Given 03/25/20 1111)  LORazepam (ATIVAN) injection 1 mg (1 mg Intravenous Not Given 03/25/20 1110)  sodium chloride 0.9 % bolus 1,000 mL (1,000 mLs Intravenous Refused 03/25/20 1241)  LORazepam (ATIVAN) injection 0.5 mg (0.5 mg Intravenous Given 03/25/20 1039)  prochlorperazine (COMPAZINE) injection 10 mg (10 mg Intravenous Given 03/25/20 1037)  diphenhydrAMINE (BENADRYL) injection 25 mg (25 mg Intravenous Given 03/25/20 1036)    ED Course  I have reviewed the triage vital signs and the nursing notes.  Pertinent labs & imaging results that were available  during my care of the patient were reviewed by me and considered in my medical decision making (see chart for details).    MDM Rules/Calculators/A&P                          29 year old female who presents for evaluation of right upper extremity paresthesia that began last night. She reports feeling like her RUE is asleep. No weakness. No history of injury. Patient is afebrile, non-toxic appearing, sitting comfortably on examination table. Vital signs reviewed and stable. Patient with good radial pulses. She does have some decreased strength of RUE and unequal grip strength. Sensation appears to be intact. No pronator drift though cannot hold the arm up against resistance.  Low suspicion for CVA but is a consideration.  Given that her symptoms began at 6 PM last evening, she is outside of the TPA window.  Also consider radiculopathy pain.  She does have a history of migraines.  She does not currently have a headache but also consider complex migraine.  Previous provider was concerned about possible MS which is why she transferred over to the ED.  Patient with no family history of MS.  It is a consideration though my suspicion is lower.  Will obtain imaging of her head and neck, including MRI with and without contrast.  Will give migraine cocktail to see if that helps with her symptoms.  History/physical exam not concerning for ischemic limb, DVT of upper extremity.  CBC shows no acute abnormalities.  CMP shows normal pain and creatinine.  I had ordered MRI brain and C-spine with and without contrast.  They were canceled without notifying me in MRI brain and C-spine without contrast were ordered.  Patient told me that when she went to MRI, they told her they would have to do 2 and she was concerned that she would not be able to tolerate it and so she had declined the with contrast and did only that without.  MRI brain and C-spine showed no evidence  of acute abnormalities.  I discussed the results with  patient.  I did discuss with her that given that the MRI with contrast was not performed, there is a small chance that there could be missed diagnosis is.  I did offer to repeat MRI with contrast but patient declined.  She understands that there is a risk that there is a missed diagnosis from not obtaining MRI without contrast.  She exhibits full medical decision-making capacity and is clinically sober.  I discussed with her that we will plan to have her follow-up with outpatient neurology for her symptoms. At this time, patient exhibits no emergent life-threatening condition that require further evaluation in ED. Patient had ample opportunity for questions and discussion. All patient's questions were answered with full understanding. Strict return precautions discussed. Patient expresses understanding and agreement to plan.   Portions of this note were generated with Scientist, clinical (histocompatibility and immunogenetics)Dragon dictation software. Dictation errors may occur despite best attempts at proofreading.   Final Clinical Impression(s) / ED Diagnoses Final diagnoses:  Paresthesia    Rx / DC Orders ED Discharge Orders         Ordered    Ambulatory referral to Neurology       Comments: An appointment is requested in approximately: 2 weeks   03/25/20 1306           Maxwell CaulLayden, Clerence Gubser A, PA-C 03/25/20 1326    Geoffery Lyonselo, Douglas, MD 03/26/20 [phone]

## 2020-03-25 NOTE — ED Notes (Signed)
Patient is being discharged from the Urgent Care and sent to the Emergency Department via POV . Per Dr. Konrad Saha, patient is in need of higher level of care due to r/o CVA, right arm weakness Patient is aware and verbalizes understanding of plan of care.  Vitals:   03/25/20 0815  BP: (!) 148/97  Pulse: 86  Resp: 18  Temp: 98.4 F (36.9 C)  SpO2: 98%

## 2020-03-25 NOTE — Discharge Instructions (Addendum)
As we discussed, your work-up today was reassuring.  It is unclear as to what is causing your symptoms.  We will refer you to an outpatient neurology that you can follow-up with.  If you have not heard from them in a week or so, call their office to arrange an appointment.  If it anytime, your symptoms worsen or you have any weakness or redness or swelling of the leg, return emergency department immediately.

## 2020-05-19 ENCOUNTER — Telehealth: Payer: Self-pay | Admitting: Neurology

## 2020-05-19 NOTE — Telephone Encounter (Signed)
MRI of the brain showed no abnormalities.   MRI of the brain without contrast 03/25/2020 IMPRESSION: No acute or significant abnormality.  Read ER note, ordered with contrast but patient declined the contrast. This was done for paresthesia, we have seen for seizures. MRI cervical spine was also unremarkable. Scans would have been best with contrast (was told this in the ER).   If she needs to be seen for paresthesia, needs new referral for new problem.

## 2020-05-19 NOTE — Telephone Encounter (Signed)
I called pt and she wanted [place] to know that she had these done.  She stated she had L arm numbness and sz?  She is claustrophobic and was forced to have due to her symptoms.  She is doing ok. She will see [place] for f/u yearly for sz.  She will call back for appt as one is not scheduled. She is not taking lamotrigine from urgent care note.

## 2020-05-19 NOTE — Telephone Encounter (Signed)
Pt states in Dec she had a MRI done at [facility], pt would like the MRI read and would like a call to discuss results.

## 2020-11-04 ENCOUNTER — Encounter (HOSPITAL_BASED_OUTPATIENT_CLINIC_OR_DEPARTMENT_OTHER): Payer: Self-pay | Admitting: Obstetrics and Gynecology

## 2020-11-04 ENCOUNTER — Other Ambulatory Visit: Payer: Self-pay

## 2020-11-04 ENCOUNTER — Emergency Department (HOSPITAL_BASED_OUTPATIENT_CLINIC_OR_DEPARTMENT_OTHER)
Admission: EM | Admit: 2020-11-04 | Discharge: 2020-11-05 | Disposition: A | Discharge status: Home / Self Care | Payer: 59 | Attending: Emergency Medicine | Admitting: Emergency Medicine

## 2020-11-04 DIAGNOSIS — R1030 Lower abdominal pain, unspecified: Secondary | ICD-10-CM

## 2020-11-04 DIAGNOSIS — J45909 Unspecified asthma, uncomplicated: Secondary | ICD-10-CM | POA: Diagnosis not present

## 2020-11-04 DIAGNOSIS — D649 Anemia, unspecified: Secondary | ICD-10-CM | POA: Diagnosis not present

## 2020-11-04 DIAGNOSIS — Z91040 Latex allergy status: Secondary | ICD-10-CM | POA: Insufficient documentation

## 2020-11-04 DIAGNOSIS — R109 Unspecified abdominal pain: Secondary | ICD-10-CM | POA: Diagnosis present

## 2020-11-04 LAB — COMPREHENSIVE METABOLIC PANEL
ALT: 14 U/L (ref 0–44)
AST: 14 U/L — ABNORMAL LOW (ref 15–41)
Albumin: 4.1 g/dL (ref 3.5–5.0)
Alkaline Phosphatase: 57 U/L (ref 38–126)
Anion gap: 7 (ref 5–15)
BUN: 13 mg/dL (ref 6–20)
CO2: 27 mmol/L (ref 22–32)
Calcium: 9.5 mg/dL (ref 8.9–10.3)
Chloride: 104 mmol/L (ref 98–111)
Creatinine, Ser: 0.94 mg/dL (ref 0.44–1.00)
GFR, Estimated: >60 mL/min (ref >60)
Glucose, Bld: 84 mg/dL (ref 70–99)
Potassium: 3.9 mmol/L (ref 3.5–5.1)
Sodium: 138 mmol/L (ref 135–145)
Total Bilirubin: 0.2 mg/dL — ABNORMAL LOW (ref 0.3–1.2)
Total Protein: 7.3 g/dL (ref 6.5–8.1)

## 2020-11-04 LAB — URINALYSIS, ROUTINE W REFLEX MICROSCOPIC
Bilirubin Urine: NEGATIVE
Glucose, UA: NEGATIVE mg/dL
Hgb urine dipstick: NEGATIVE
Leukocytes,Ua: NEGATIVE
Nitrite: NEGATIVE
Specific Gravity, Urine: 1.033 — ABNORMAL HIGH (ref 1.005–1.030)
pH: 7 (ref 5.0–8.0)

## 2020-11-04 LAB — CBC
HCT: 34.8 % — ABNORMAL LOW (ref 36.0–46.0)
Hemoglobin: 11.1 g/dL — ABNORMAL LOW (ref 12.0–15.0)
MCH: 27.3 pg (ref 26.0–34.0)
MCHC: 31.9 g/dL (ref 30.0–36.0)
MCV: 85.5 fL (ref 80.0–100.0)
Platelets: 293 10*3/uL (ref 150–400)
RBC: 4.07 MIL/uL (ref 3.87–5.11)
RDW: 13.3 % (ref 11.5–15.5)
WBC: 9.2 10*3/uL (ref 4.0–10.5)
nRBC: 0 % (ref 0.0–0.2)

## 2020-11-04 LAB — LIPASE, BLOOD: Lipase: 14 U/L (ref 11–51)

## 2020-11-04 LAB — OCCULT BLOOD X 1 CARD TO LAB, STOOL: Fecal Occult Bld: NEGATIVE

## 2020-11-04 LAB — PREGNANCY, URINE: Preg Test, Ur: NEGATIVE

## 2020-11-04 NOTE — ED Provider Notes (Signed)
[facility] EMERGENCY DEPT Provider Note   CSN: [id number] Arrival date & time: 11/04/20  2047     History Chief Complaint  Patient presents with   Abdominal Cramping    Karen Simon is a 30 y.o. female.  The history is provided by the patient.  Abdominal Cramping She has history of complex partial seizures and comes in complaining of abdominal cramping for the last week.  Cramps have generally been only moderate in intensity and she rates them at 5/10, but she did have 1 episode where she had severe cramps which she rated at 10/10.  There is no associated nausea or vomiting.  She denies any urinary difficulty.  Last menses was about 10 days ago and was normal.  Today, she had an episode of bright red blood with her stool.  Nothing seems to make her cramps better and nothing seems to make them worse.  Acetaminophen does give her temporary relief.  She denies dizziness or lightheadedness.  Her sexual partner has had a vasectomy.   Past Medical History:  Diagnosis Date   Anemia    Asthma    Enlarged heart    H/O candidiasis    H/O varicella    Irregular periods/menstrual cycles 07/01/09   Migraines    Morbid obesity (HCC) 09/17/2015   Seizure-like activity (HCC)    Seizures (HCC)     Patient Active Problem List   Diagnosis Date Noted   Partial symptomatic epilepsy with complex partial seizures, not intractable, without status epilepticus (HCC) 04/11/2019   Morbid obesity (HCC) 09/17/2015   Normal labor 02/23/2014   Lactating mother 11/27/2011   Teen pregnancy 11/27/2011   Pregnant state, incidental 11/25/2011   Vaginal delivery 11/25/2011   Perineal laceration complicating delivery 11/25/2011   Candida albicans infection 11/15/2011   Excess weight gain in pregnancy 11/07/2011   Anemia in pregnancy 09/14/2011   History of high blood pressure - never took meds 04/12/2011   Latex allergy 04/12/2011   Shellfish allergy 04/12/2011   History of anemia  11/26/2010   Asthma 11/26/2010   Migraine 11/26/2010    Past Surgical History:  Procedure Laterality Date   CHOLECYSTECTOMY  03/20/2012   Procedure: LAPAROSCOPIC CHOLECYSTECTOMY;  Surgeon: Shelly Rubenstein, MD;  Location: [facility];  Service: General;  Laterality: N/A;     OB History     Gravida  2   Para  2   Term  2   Preterm  0   AB  0   Living  2      SAB  0   IAB  0   Ectopic  0   Multiple  0   Live Births  2           Family History  Problem Relation Age of Onset   Hypertension Mother    Cancer Maternal Grandmother        breast   Cancer Maternal Grandfather        colon   Seizures Father    Sleep apnea Father    Seizures Paternal Aunt        x 3 aunts   Anesthesia problems Neg Hx     Social History   Tobacco Use   Smoking status: Never   Smokeless tobacco: Never  Substance Use Topics   Alcohol use: Never   Drug use: Yes    Types: Marijuana    Home Medications Prior to Admission medications   Medication Sig Start Date End Date Taking?  Authorizing Provider  JUNEL FE 1/20 1-20 MG-MCG tablet Take 1 tablet by mouth daily.  01/20/20   [provider]  lamoTRIgine (LAMICTAL) 25 MG tablet 1 tablet twice a day for the first week 2 tablets twice a day for the second week 3 tablets twice a day for the third week 4 tablets twice a day for the fourth week  For total of 140 tablets  After finish titration with small dose of lamotrigine 25 mg, change to lamotrigine 100 mg twice a day Patient not taking: Reported on 03/25/2020 08/15/19   Glean Salvo, NP  Multiple Vitamin (MULTIVITAMIN) tablet Take 1 tablet by mouth daily.    [provider]    Allergies    Penicillins, Iodine, Latex, Shellfish allergy, and Tomato  Review of Systems   Review of Systems  All other systems reviewed and are negative.  Physical Exam Updated Vital Signs BP 114/72   Pulse 60   Temp 98.3 F (36.8 C) (Oral)   Resp 18   Ht 5\' 5"  (1.651 m)    Wt 118.8 kg   SpO2 100%   BMI 43.58 kg/m   Physical Exam Vitals and nursing note reviewed.  30 year old female, resting comfortably and in no acute distress. Vital signs are normal. Oxygen saturation is 100%, which is normal. Head is normocephalic and atraumatic. PERRLA, EOMI. Oropharynx is clear. Neck is nontender and supple without adenopathy or JVD. Back is nontender and there is no CVA tenderness. Lungs are clear without rales, wheezes, or rhonchi. Chest is nontender. Heart has regular rate and rhythm without murmur. Abdomen is soft, flat, nontender without masses or hepatosplenomegaly and peristalsis is normoactive. Rectal: Normal sphincter tone, no masses.  Small amount of brown stool which is sent for Hemoccult testing. Extremities have no cyanosis or edema, full range of motion is present. Skin is warm and dry without rash. Neurologic: Mental status is normal, cranial nerves are intact, there are no motor or sensory deficits.  ED Results / Procedures / Treatments   Labs (all labs ordered are listed, but only abnormal results are displayed) Labs Reviewed  COMPREHENSIVE METABOLIC PANEL - Abnormal; Notable for the following components:      Result Value   AST 14 (*)    Total Bilirubin 0.2 (*)    All other components within normal limits  CBC - Abnormal; Notable for the following components:   Hemoglobin 11.1 (*)    HCT 34.8 (*)    All other components within normal limits  URINALYSIS, ROUTINE W REFLEX MICROSCOPIC - Abnormal; Notable for the following components:   Specific Gravity, Urine 1.033 (*)    Ketones, ur TRACE (*)    Protein, ur TRACE (*)    All other components within normal limits  LIPASE, BLOOD  PREGNANCY, URINE  OCCULT BLOOD X 1 CARD TO LAB, STOOL   Procedures Procedures   Medications Ordered in ED Medications - No data to display  ED Course  I have reviewed the triage vital signs and the nursing notes.  Pertinent lab results that were available  during my care of the patient were reviewed by me and considered in my medical decision making (see chart for details).   MDM Rules/Calculators/A&P                         Lower abdominal cramping with episode of hematochezia, benign exam.  Labs show mild anemia which is unchanged from baseline.  Will check  orthostatic vital signs.  Old records are reviewed, and she has no relevant past visits.  Stool is Hemoccult negative.  Orthostatic vital signs show no significant change in pulse or blood pressure.  Patient is reassured that there is no sign of any significant bleeding.  [facility] and also to gastroenterology.  Return precautions discussed.  Final Clinical Impression(s) / ED Diagnoses Final diagnoses:  Lower abdominal pain    Rx / DC Orders ED Discharge Orders     None        Dione Booze, MD 11/04/20 2355

## 2020-11-04 NOTE — Discharge Instructions (Addendum)
Your evaluation today did not show any signs of blood in your stool or signs of significant blood loss.  Please continue taking acetaminophen and/or ibuprofen as needed for cramping.  If your pain is getting worse, please return for further evaluation.  Otherwise, follow-up with the [facility], and with the gastroenterologist.

## 2020-11-04 NOTE — ED Triage Notes (Signed)
Patient reports abdominal cramping for x7 days and has gotten progressively worse. Patient reports she had a BM today that had a large amount of bright red blood in her stool. Patient denies N/V/D, denies chest pain or SOB.

## 2020-12-03 ENCOUNTER — Other Ambulatory Visit: Payer: Self-pay

## 2020-12-03 ENCOUNTER — Encounter (HOSPITAL_COMMUNITY): Payer: Self-pay | Admitting: Emergency Medicine

## 2020-12-03 ENCOUNTER — Ambulatory Visit (HOSPITAL_COMMUNITY)
Admission: EM | Admit: 2020-12-03 | Discharge: 2020-12-03 | Disposition: A | Discharge status: Home / Self Care | Payer: 59 | Attending: Internal Medicine | Admitting: Internal Medicine

## 2020-12-03 DIAGNOSIS — U071 COVID-19: Secondary | ICD-10-CM

## 2020-12-03 DIAGNOSIS — R0602 Shortness of breath: Secondary | ICD-10-CM | POA: Diagnosis not present

## 2020-12-03 MED ORDER — PREDNISONE 20 MG PO TABS: 40.0000 mg | ORAL_TABLET | Freq: Every day | ORAL | 0 refills | Status: AC

## 2020-12-03 NOTE — ED Triage Notes (Signed)
Pt is present today with mild SOB. Pt states that she noticed the SOB last wednesday. Pt states that she did test positive for Covid last week. Pt states that she is now having to use her inhaler twice daily to help with SOB.

## 2020-12-03 NOTE — Discharge Instructions (Addendum)
Please go the hospital if shortness of breath worsens.  You may continue to use your albuterol inhaler as needed.  You have been prescribed prednisone steroid to decrease inflammation in chest.  Please take with food.  Please follow-up with the neurologist as soon as possible.

## 2020-12-03 NOTE — ED Provider Notes (Addendum)
[facility]    CSN: [id number] Arrival date & time: 12/03/20  1840      History   Chief Complaint Chief Complaint  Patient presents with   Shortness of Breath    HPI Karen Simon is a 30 y.o. female.   Patient presents to urgent care today with 1 week history of shortness of breath.  States that she tested positive for COVID-19 approximately 8 days ago.  All other symptoms have resolved but she has noticed that shortness of breath has been consistent and intermittent.  Has history of asthma and was prescribed albuterol inhaler for previous acute illness.  Patient has been having to use inhaler at least twice daily to help with shortness of breath with no improvement in symptoms.  Denies any chest pain.  Denies any known fevers.  Patient also states that she had a seizure approximately 1 week ago.  Patient has been having seizures that coincide with her menstrual cycles monthly for the past few months.  Was prescribed Lamictal to take for seizure prevention by neurologist but quit taking because she did not like "mood swings that it gave her".  Last saw neurologist at beginning of the year.  Does not have a follow-up appointment at this time.  Denies any headache, dizziness, nausea, vomiting, blurred vision, confusion, disorientation.   Shortness of Breath  Past Medical History:  Diagnosis Date   Anemia    Asthma    Enlarged heart    H/O candidiasis    H/O varicella    Irregular periods/menstrual cycles 07/01/09   Migraines    Morbid obesity (HCC) 09/17/2015   Seizure-like activity (HCC)    Seizures (HCC)     Patient Active Problem List   Diagnosis Date Noted   Partial symptomatic epilepsy with complex partial seizures, not intractable, without status epilepticus (HCC) 04/11/2019   Morbid obesity (HCC) 09/17/2015   Normal labor 02/23/2014   Lactating mother 11/27/2011   Teen pregnancy 11/27/2011   Pregnant state, incidental 11/25/2011   Vaginal delivery  11/25/2011   Perineal laceration complicating delivery 11/25/2011   Candida albicans infection 11/15/2011   Excess weight gain in pregnancy 11/07/2011   Anemia in pregnancy 09/14/2011   History of high blood pressure - never took meds 04/12/2011   Latex allergy 04/12/2011   Shellfish allergy 04/12/2011   History of anemia 11/26/2010   Asthma 11/26/2010   Migraine 11/26/2010    Past Surgical History:  Procedure Laterality Date   CHOLECYSTECTOMY  03/20/2012   Procedure: LAPAROSCOPIC CHOLECYSTECTOMY;  Surgeon: Shelly Rubenstein, MD;  Location: [facility];  Service: General;  Laterality: N/A;    OB History     Gravida  2   Para  2   Term  2   Preterm  0   AB  0   Living  2      SAB  0   IAB  0   Ectopic  0   Multiple  0   Live Births  2            Home Medications    Prior to Admission medications   Medication Sig Start Date End Date Taking? Authorizing Provider  predniSONE (DELTASONE) 20 MG tablet Take 2 tablets (40 mg total) by mouth daily for 5 days. 12/03/20 12/08/20 Yes Lance Muss, FNP  JUNEL FE 1/20 1-20 MG-MCG tablet Take 1 tablet by mouth daily.  01/20/20   [provider]  lamoTRIgine (LAMICTAL) 25 MG tablet 1 tablet  twice a day for the first week 2 tablets twice a day for the second week 3 tablets twice a day for the third week 4 tablets twice a day for the fourth week  For total of 140 tablets  After finish titration with small dose of lamotrigine 25 mg, change to lamotrigine 100 mg twice a day Patient not taking: No sig reported 08/15/19   Glean Salvo, NP  Multiple Vitamin (MULTIVITAMIN) tablet Take 1 tablet by mouth daily.    [provider]    Family History Family History  Problem Relation Age of Onset   Hypertension Mother    Cancer Maternal Grandmother        breast   Cancer Maternal Grandfather        colon   Seizures Father    Sleep apnea Father    Seizures Paternal Aunt        x 3 aunts   Anesthesia  problems Neg Hx     Social History Social History   Tobacco Use   Smoking status: Never   Smokeless tobacco: Never  Substance Use Topics   Alcohol use: Never   Drug use: Yes    Types: Marijuana     Allergies   Latex, Penicillins, Tomato, Iodine, and Shellfish allergy   Review of Systems Review of Systems Per HPI  Physical Exam Triage Vital Signs ED Triage Vitals  Enc Vitals Group     BP 12/03/20 1945 (!) 114/58     Pulse Rate 12/03/20 1945 84     Resp 12/03/20 1945 17     Temp 12/03/20 1945 98.6 F (37 C)     Temp Source 12/03/20 1945 Oral     SpO2 12/03/20 1945 99 %     Weight --      Height --      Head Circumference --      Peak Flow --      Pain Score 12/03/20 1942 0     Pain Loc --      Pain Edu? --      Excl. in GC? --    No data found.  Updated Vital Signs BP (!) 114/58   Pulse 84   Temp 98.6 F (37 C) (Oral)   Resp 17   SpO2 99%   Visual Acuity Right Eye Distance:   Left Eye Distance:   Bilateral Distance:    Right Eye Near:   Left Eye Near:    Bilateral Near:     Physical Exam Constitutional:      General: She is not in acute distress.    Appearance: Normal appearance. She is not ill-appearing, toxic-appearing or diaphoretic.  HENT:     Head: Normocephalic and atraumatic.  Eyes:     Extraocular Movements: Extraocular movements intact.     Conjunctiva/sclera: Conjunctivae normal.  Cardiovascular:     Rate and Rhythm: Normal rate and regular rhythm.     Pulses: Normal pulses.     Heart sounds: Normal heart sounds.  Pulmonary:     Effort: Pulmonary effort is normal. No tachypnea.     Breath sounds: Normal breath sounds. No decreased breath sounds, wheezing, rhonchi or rales.  Skin:    General: Skin is warm and dry.  Neurological:     General: No focal deficit present.     Mental Status: She is alert and oriented to person, place, and time. Mental status is at baseline.  Psychiatric:        Mood  and Affect: Mood normal.         Behavior: Behavior normal.        Thought Content: Thought content normal.        Judgment: Judgment normal.     UC Treatments / Results  Labs (all labs ordered are listed, but only abnormal results are displayed) Labs Reviewed - No data to display  EKG   Radiology No results found.  Procedures Procedures (including critical care time)  Medications Ordered in UC Medications - No data to display  Initial Impression / Assessment and Plan / UC Course  I have reviewed the triage vital signs and the nursing notes.  Pertinent labs & imaging results that were available during my care of the patient were reviewed by me and considered in my medical decision making (see chart for details).     Suspect shortness of breath is related to inflammation of lungs related to COVID-19.  Vital signs and oxygen stable at this time, therefore patient does not need immediate medical attention at the hospital at this time.  Suggested chest x-ray to evaluate shortness of breath but patient declined with shared decision making.  Will prescribe prednisone x5 days to decrease inflammation in chest and help with shortness of breath.  Patient to continue using albuterol inhaler as needed for shortness of breath.  Advised patient to go to the hospital if shortness of breath worsens or chest pain develops.  Advised patient of the importance of following up with her neurologist as soon as possible for evaluation of more frequent seizures.  Patient voiced understanding. Discussed strict return precautions. Patient verbalized understanding and is agreeable with plan.  Final Clinical Impressions(s) / UC Diagnoses   Final diagnoses:  Shortness of breath  COVID-19     Discharge Instructions      Please go the hospital if shortness of breath worsens.  You may continue to use your albuterol inhaler as needed.  You have been prescribed prednisone steroid to decrease inflammation in chest.  Please take with  food.  Please follow-up with the neurologist as soon as possible.     ED Prescriptions     Medication Sig Dispense Auth. Provider   predniSONE (DELTASONE) 20 MG tablet Take 2 tablets (40 mg total) by mouth daily for 5 days. 10 tablet Lance Muss, FNP      PDMP not reviewed this encounter.   Lance Muss, FNP 12/03/20 2101    Lance Muss, FNP 12/03/20 2102

## 2020-12-24 ENCOUNTER — Encounter: Payer: 59 | Admitting: Nurse Practitioner

## 2020-12-24 DIAGNOSIS — Z0289 Encounter for other administrative examinations: Secondary | ICD-10-CM

## 2021-01-07 ENCOUNTER — Encounter (HOSPITAL_COMMUNITY): Payer: Self-pay | Admitting: Emergency Medicine

## 2021-01-07 ENCOUNTER — Ambulatory Visit (INDEPENDENT_AMBULATORY_CARE_PROVIDER_SITE_OTHER): Payer: 59

## 2021-01-07 ENCOUNTER — Ambulatory Visit (HOSPITAL_COMMUNITY)
Admission: EM | Admit: 2021-01-07 | Discharge: 2021-01-07 | Disposition: A | Discharge status: Home / Self Care | Payer: 59

## 2021-01-07 ENCOUNTER — Other Ambulatory Visit: Payer: Self-pay

## 2021-01-07 DIAGNOSIS — M545 Low back pain, unspecified: Secondary | ICD-10-CM | POA: Diagnosis not present

## 2021-01-07 DIAGNOSIS — M549 Dorsalgia, unspecified: Secondary | ICD-10-CM

## 2021-01-07 IMAGING — DX DG LUMBAR SPINE 2-3V
2 series · 2 of 2 positions shown · non-contrast
Comparison: None.

CLINICAL DATA: Midline back pain after lifting injury.

EXAM:
LUMBAR SPINE - 2-3 VIEW

[l-spine ap]
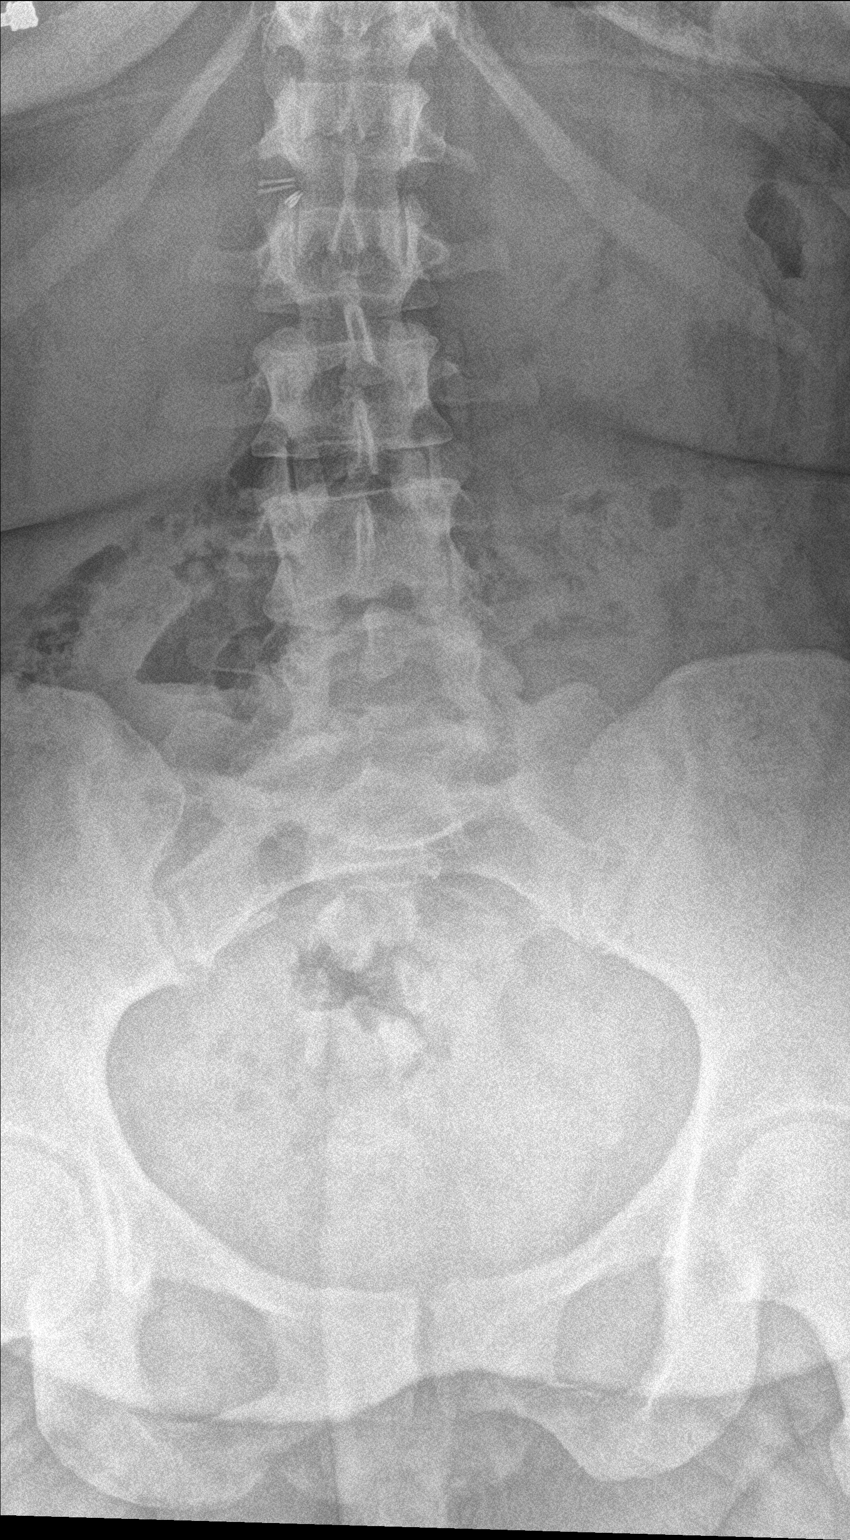

[l-spine lat]
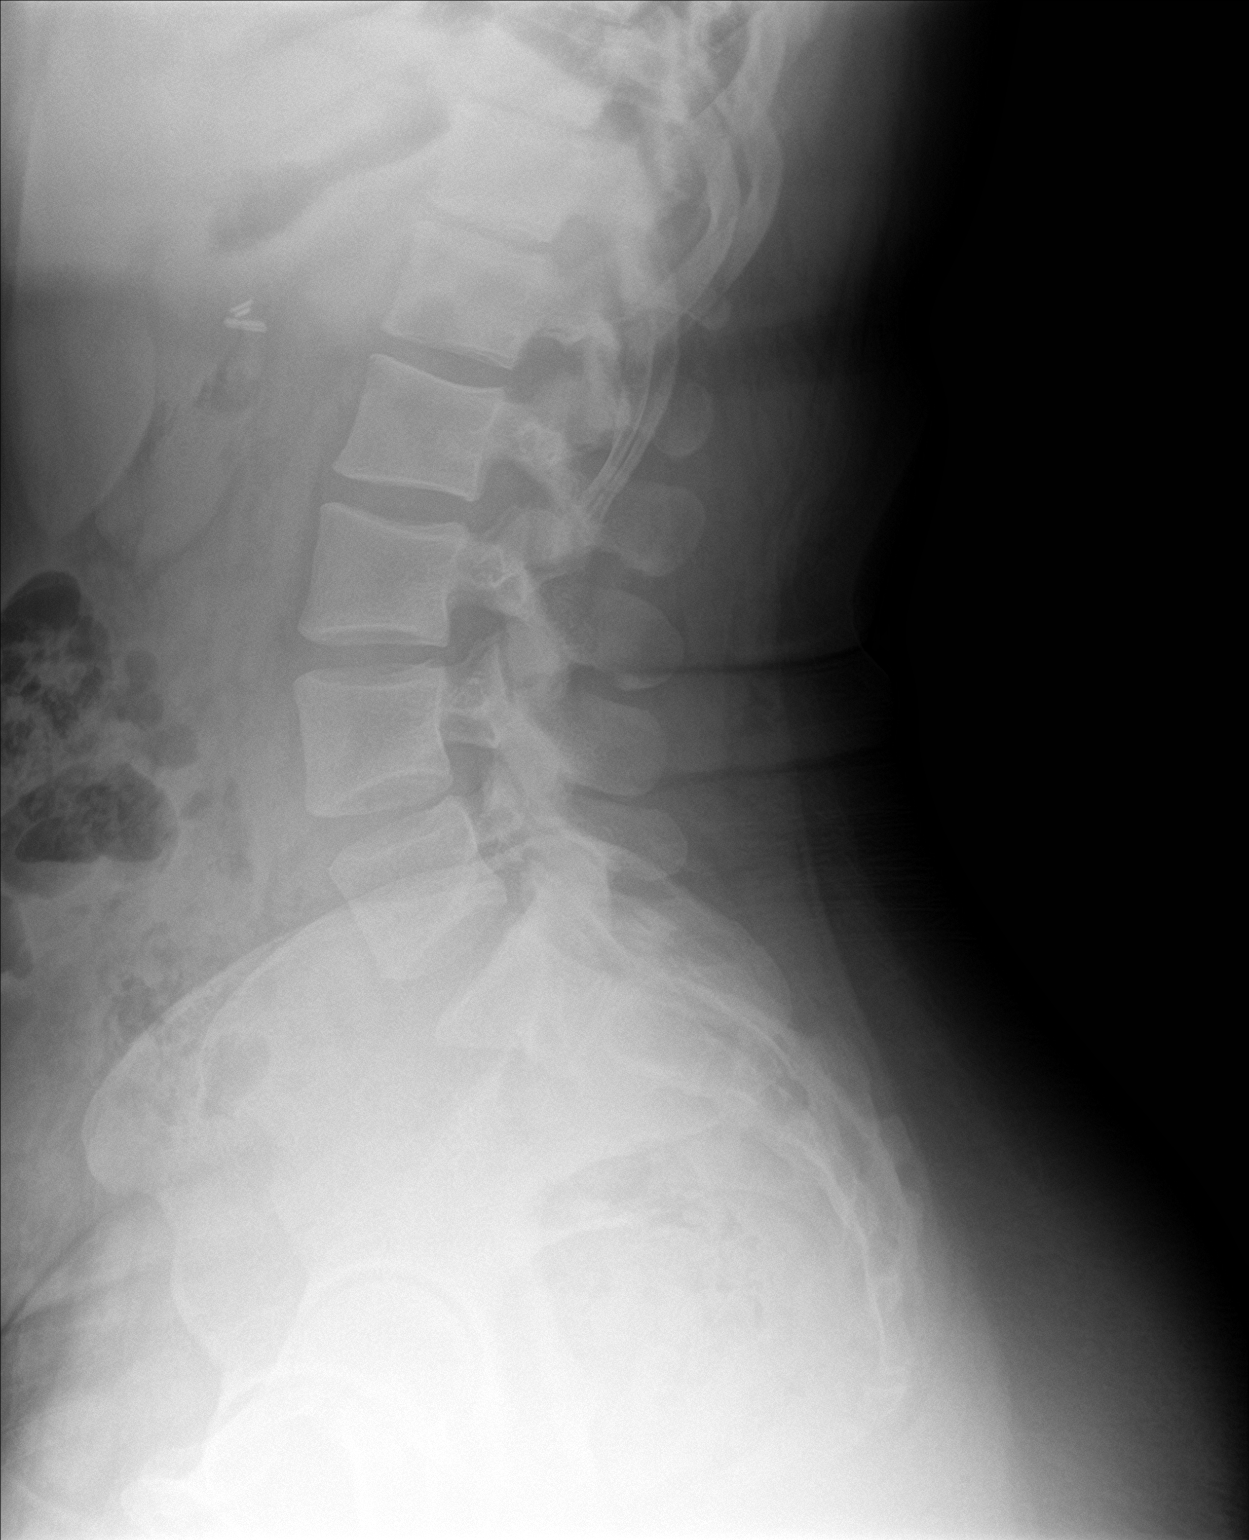

[2 of 2 positions shown; findings below may reference images not displayed]

FINDINGS: There is no evidence of lumbar spine fracture. Alignment is normal.
Intervertebral disc spaces are maintained.
IMPRESSION: Negative.

## 2021-01-07 MED ORDER — METHOCARBAMOL 500 MG PO TABS: 500.0000 mg | ORAL_TABLET | Freq: Two times a day (BID) | ORAL | 0 refills | Status: DC

## 2021-01-07 MED ORDER — KETOROLAC TROMETHAMINE 60 MG/2ML IM SOLN
60.0000 mg | Freq: Once | INTRAMUSCULAR | Status: AC
  Administered 2021-01-07: 60 mg via INTRAMUSCULAR

## 2021-01-07 MED ORDER — MELOXICAM 15 MG PO TABS: 15.0000 mg | ORAL_TABLET | Freq: Every day | ORAL | 0 refills | Status: DC

## 2021-01-07 MED ORDER — KETOROLAC TROMETHAMINE 60 MG/2ML IM SOLN
INTRAMUSCULAR | Status: AC
  Filled 2021-01-07: qty 2

## 2021-01-07 NOTE — ED Provider Notes (Signed)
[facility]    CSN: [id number] Arrival date & time: 01/07/21  6948      History   Chief Complaint Back Pain  HPI Karen Simon is a 30 y.o. female.   HPI  Back Pain: Pt reports back pain for the past 3 days. Pain is unimproved and slightly worsened. No known injury but does reports that symptoms started after lifting heavy boxes. She has tried ibuprofen, tylenol and topicals without any improvement which has her concerned.  She denies any numbness or tingling, numbness of groin, incontinence or neuro weakness.  No past injuries of back.  Past Medical History:  Diagnosis Date   Anemia    Asthma    Enlarged heart    H/O candidiasis    H/O varicella    Irregular periods/menstrual cycles 07/01/09   Migraines    Morbid obesity (HCC) 09/17/2015   Seizure-like activity (HCC)    Seizures (HCC)     Patient Active Problem List   Diagnosis Date Noted   Partial symptomatic epilepsy with complex partial seizures, not intractable, without status epilepticus (HCC) 04/11/2019   Morbid obesity (HCC) 09/17/2015   Normal labor 02/23/2014   Lactating mother 11/27/2011   Teen pregnancy 11/27/2011   Pregnant state, incidental 11/25/2011   Vaginal delivery 11/25/2011   Perineal laceration complicating delivery 11/25/2011   Candida albicans infection 11/15/2011   Excess weight gain in pregnancy 11/07/2011   Anemia in pregnancy 09/14/2011   History of high blood pressure - never took meds 04/12/2011   Latex allergy 04/12/2011   Shellfish allergy 04/12/2011   History of anemia 11/26/2010   Asthma 11/26/2010   Migraine 11/26/2010    Past Surgical History:  Procedure Laterality Date   CHOLECYSTECTOMY  03/20/2012   Procedure: LAPAROSCOPIC CHOLECYSTECTOMY;  Surgeon: Shelly Rubenstein, MD;  Location: [facility];  Service: General;  Laterality: N/A;    OB History     Gravida  2   Para  2   Term  2   Preterm  0   AB  0   Living  2      SAB  0   IAB  0    Ectopic  0   Multiple  0   Live Births  2            Home Medications    Prior to Admission medications   Medication Sig Start Date End Date Taking? Authorizing Provider  acetaminophen (TYLENOL) 325 MG tablet Take 650 mg by mouth every 6 (six) hours as needed.   Yes [provider]  JUNEL FE 1/20 1-20 MG-MCG tablet Take 1 tablet by mouth daily.  01/20/20   [provider]  lamoTRIgine (LAMICTAL) 25 MG tablet 1 tablet twice a day for the first week 2 tablets twice a day for the second week 3 tablets twice a day for the third week 4 tablets twice a day for the fourth week  For total of 140 tablets  After finish titration with small dose of lamotrigine 25 mg, change to lamotrigine 100 mg twice a day Patient not taking: No sig reported 08/15/19   Glean Salvo, NP  Multiple Vitamin (MULTIVITAMIN) tablet Take 1 tablet by mouth daily.    [provider]    Family History Family History  Problem Relation Age of Onset   Hypertension Mother    Cancer Maternal Grandmother        breast   Cancer Maternal Grandfather  colon   Seizures Father    Sleep apnea Father    Seizures Paternal Aunt        x 3 aunts   Anesthesia problems Neg Hx     Social History Social History   Tobacco Use   Smoking status: Never   Smokeless tobacco: Never  Vaping Use   Vaping Use: Former  Substance Use Topics   Alcohol use: Never   Drug use: Yes    Types: Marijuana     Allergies   Latex, Penicillins, Tomato, Iodine, and Shellfish allergy   Review of Systems Review of Systems  As stated above in HPI Physical Exam Triage Vital Signs ED Triage Vitals [01/07/21 0835]  Enc Vitals Group     BP      Pulse      Resp      Temp      Temp src      SpO2      Weight      Height      Head Circumference      Peak Flow      Pain Score 8     Pain Loc      Pain Edu?      Excl. in GC?    No data found.  Updated Vital Signs BP 118/67 (BP Location: Right  Arm) Comment (BP Location): large cuff  Pulse 73   Temp 98.6 F (37 C) (Oral)   Resp 20   SpO2 97%   Physical Exam Vitals and nursing note reviewed.  Constitutional:      General: She is not in acute distress.    Appearance: Normal appearance. She is not ill-appearing, toxic-appearing or diaphoretic.  HENT:     Head: Normocephalic and atraumatic.  Musculoskeletal:     Comments: Back flexion limited by about 50 %, back extension limited by about 75%. Twisting is normal. Normal straight leg raise bilaterally. Midline tenderness of lower lumbar spine. No tenderness of back muscles throughout.   Skin:    General: Skin is warm.     Capillary Refill: Capillary refill takes less than 2 seconds.  Neurological:     Mental Status: She is alert and oriented to person, place, and time.     Sensory: No sensory deficit.     Motor: No weakness.     Coordination: Coordination normal.     Gait: Gait normal.     Deep Tendon Reflexes: Reflexes normal.     UC Treatments / Results  Labs (all labs ordered are listed, but only abnormal results are displayed) Labs Reviewed - No data to display  EKG   Radiology No results found.  Procedures Procedures (including critical care time)  Medications Ordered in UC Medications - No data to display  Initial Impression / Assessment and Plan / UC Course  I have reviewed the triage vital signs and the nursing notes.  Pertinent labs & imaging results that were available during my care of the patient were reviewed by me and considered in my medical decision making (see chart for details).     New.  Concern for herniated disc so we will obtain an x-ray of her lower back.  I have recommended Toradol, muscle relaxers and NSAIDs and will recommend PT and Ortho follow-up should she have a herniated disc or other abnormality.  Discussed red flag signs and symptoms.  UPDATE: x ray is normal. Will administer Toradol after discussing with patient who reports  no other NSAID today- she  will only take tylenol today for pain.  Final Clinical Impressions(s) / UC Diagnoses   Final diagnoses:  None   Discharge Instructions   None    ED Prescriptions   None    PDMP not reviewed this encounter.   Rushie Chestnut, [place] 01/07/21 [phone]

## 2021-01-07 NOTE — ED Triage Notes (Signed)
Complains of back pain that started 3 days ago.  No known injury.  Denies history.  Patient reports pain in middle of lower back

## 2021-02-28 ENCOUNTER — Other Ambulatory Visit: Payer: Self-pay

## 2021-02-28 ENCOUNTER — Emergency Department (HOSPITAL_BASED_OUTPATIENT_CLINIC_OR_DEPARTMENT_OTHER)
Admission: EM | Admit: 2021-02-28 | Discharge: 2021-02-28 | Disposition: A | Discharge status: Home / Self Care | Payer: 59 | Attending: Emergency Medicine | Admitting: Emergency Medicine

## 2021-02-28 ENCOUNTER — Encounter (HOSPITAL_BASED_OUTPATIENT_CLINIC_OR_DEPARTMENT_OTHER): Payer: Self-pay | Admitting: Emergency Medicine

## 2021-02-28 DIAGNOSIS — G44309 Post-traumatic headache, unspecified, not intractable: Secondary | ICD-10-CM | POA: Diagnosis not present

## 2021-02-28 DIAGNOSIS — R519 Headache, unspecified: Secondary | ICD-10-CM

## 2021-02-28 MED ORDER — PROCHLORPERAZINE EDISYLATE 10 MG/2ML IJ SOLN
10.0000 mg | Freq: Once | INTRAMUSCULAR | Status: AC
  Administered 2021-02-28: 10 mg via INTRAMUSCULAR
  Filled 2021-02-28: qty 2

## 2021-02-28 MED ORDER — DEXAMETHASONE 4 MG PO TABS
10.0000 mg | ORAL_TABLET | Freq: Once | ORAL | Status: AC
  Administered 2021-02-28: 10 mg via ORAL
  Filled 2021-02-28: qty 3

## 2021-02-28 MED ORDER — DIPHENHYDRAMINE HCL 25 MG PO CAPS
25.0000 mg | ORAL_CAPSULE | Freq: Once | ORAL | Status: AC
  Administered 2021-02-28: 25 mg via ORAL
  Filled 2021-02-28: qty 1

## 2021-02-28 NOTE — ED Provider Notes (Signed)
[facility] EMERGENCY DEPT Provider Note   CSN: [id number] Arrival date & time: 02/28/21  9381     History Chief Complaint  Patient presents with   Headache    Karen Simon is a 30 y.o. female.  The history is provided by the patient.  Headache Pain location:  Generalized Radiates to:  Does not radiate Severity currently:  7/10 Onset quality:  Gradual Duration:  1 week Timing:  Intermittent Progression:  Waxing and waning Chronicity:  Recurrent Similar to prior headaches: yes   Relieved by:  Nothing Worsened by:  Nothing Associated symptoms: no abdominal pain, no back pain, no blurred vision, no congestion, no cough, no diarrhea, no dizziness, no drainage, no ear pain, no eye pain, no facial pain, no fatigue, no fever, no focal weakness, no hearing loss, no loss of balance, no myalgias, no nausea, no near-syncope, no neck pain, no neck stiffness, no numbness, no paresthesias, no photophobia, no seizures, no sinus pressure, no sore throat, no swollen glands, no syncope, no tingling, no URI, no visual change, no vomiting and no weakness       Past Medical History:  Diagnosis Date   Anemia    Asthma    Enlarged heart    H/O candidiasis    H/O varicella    Irregular periods/menstrual cycles 07/01/09   Migraines    Morbid obesity (HCC) 09/17/2015   Seizure-like activity (HCC)    Seizures (HCC)     Patient Active Problem List   Diagnosis Date Noted   Partial symptomatic epilepsy with complex partial seizures, not intractable, without status epilepticus (HCC) 04/11/2019   Morbid obesity (HCC) 09/17/2015   Normal labor 02/23/2014   Lactating mother 11/27/2011   Teen pregnancy 11/27/2011   Pregnant state, incidental 11/25/2011   Vaginal delivery 11/25/2011   Perineal laceration complicating delivery 11/25/2011   Candida albicans infection 11/15/2011   Excess weight gain in pregnancy 11/07/2011   Anemia in pregnancy 09/14/2011   History of high  blood pressure - never took meds 04/12/2011   Latex allergy 04/12/2011   Shellfish allergy 04/12/2011   History of anemia 11/26/2010   Asthma 11/26/2010   Migraine 11/26/2010    Past Surgical History:  Procedure Laterality Date   CHOLECYSTECTOMY  03/20/2012   Procedure: LAPAROSCOPIC CHOLECYSTECTOMY;  Surgeon: Shelly Rubenstein, MD;  Location: [facility];  Service: General;  Laterality: N/A;     OB History     Gravida  2   Para  2   Term  2   Preterm  0   AB  0   Living  2      SAB  0   IAB  0   Ectopic  0   Multiple  0   Live Births  2           Family History  Problem Relation Age of Onset   Hypertension Mother    Cancer Maternal Grandmother        breast   Cancer Maternal Grandfather        colon   Seizures Father    Sleep apnea Father    Seizures Paternal Aunt        x 3 aunts   Anesthesia problems Neg Hx     Social History   Tobacco Use   Smoking status: Never   Smokeless tobacco: Never  Vaping Use   Vaping Use: Former  Substance Use Topics   Alcohol use: Never   Drug use: Yes  Types: Marijuana    Home Medications Prior to Admission medications   Medication Sig Start Date End Date Taking? Authorizing Provider  acetaminophen (TYLENOL) 325 MG tablet Take 650 mg by mouth every 6 (six) hours as needed.    [provider]  JUNEL FE 1/20 1-20 MG-MCG tablet Take 1 tablet by mouth daily.  01/20/20   [provider]  lamoTRIgine (LAMICTAL) 25 MG tablet 1 tablet twice a day for the first week 2 tablets twice a day for the second week 3 tablets twice a day for the third week 4 tablets twice a day for the fourth week  For total of 140 tablets  After finish titration with small dose of lamotrigine 25 mg, change to lamotrigine 100 mg twice a day Patient not taking: No sig reported 08/15/19   Glean Salvo, NP  meloxicam (MOBIC) 15 MG tablet Take 1 tablet (15 mg total) by mouth daily. 01/07/21   Rushie Chestnut, PA-C   methocarbamol (ROBAXIN) 500 MG tablet Take 1 tablet (500 mg total) by mouth 2 (two) times daily. 01/07/21   Rushie Chestnut, PA-C  Multiple Vitamin (MULTIVITAMIN) tablet Take 1 tablet by mouth daily.    [provider]    Allergies    Latex, Penicillins, Tomato, Iodine, and Shellfish allergy  Review of Systems   Review of Systems  Constitutional:  Negative for chills, fatigue and fever.  HENT:  Negative for congestion, ear pain, hearing loss, postnasal drip, sinus pressure and sore throat.   Eyes:  Negative for blurred vision, photophobia, pain and visual disturbance.  Respiratory:  Negative for cough and shortness of breath.   Cardiovascular:  Negative for chest pain, palpitations, syncope and near-syncope.  Gastrointestinal:  Negative for abdominal pain, diarrhea, nausea and vomiting.  Genitourinary:  Negative for dysuria and hematuria.  Musculoskeletal:  Negative for arthralgias, back pain, myalgias, neck pain and neck stiffness.  Skin:  Negative for color change and rash.  Neurological:  Positive for headaches. Negative for dizziness, focal weakness, seizures, syncope, weakness, numbness, paresthesias and loss of balance.  All other systems reviewed and are negative.  Physical Exam Updated Vital Signs BP 132/79   Pulse 97   Temp 98.5 F (36.9 C) (Oral)   Resp 20   SpO2 100%   Physical Exam Vitals and nursing note reviewed.  Constitutional:      General: She is not in acute distress.    Appearance: She is well-developed. She is not ill-appearing.  HENT:     Head: Normocephalic and atraumatic.     Mouth/Throat:     Mouth: Mucous membranes are moist.  Eyes:     General: No visual field deficit.    Extraocular Movements: Extraocular movements intact.     Right eye: Normal extraocular motion.     Left eye: Normal extraocular motion.     Conjunctiva/sclera: Conjunctivae normal.     Pupils: Pupils are equal, round, and reactive to light.  Cardiovascular:      Rate and Rhythm: Normal rate and regular rhythm.     Heart sounds: No murmur heard. Pulmonary:     Effort: Pulmonary effort is normal. No respiratory distress.     Breath sounds: Normal breath sounds.  Abdominal:     Palpations: Abdomen is soft.     Tenderness: There is no abdominal tenderness.  Musculoskeletal:     Cervical back: Normal range of motion and neck supple. No rigidity.  Skin:    General: Skin is warm and  dry.  Neurological:     Mental Status: She is alert and oriented to person, place, and time.     Cranial Nerves: No cranial nerve deficit, dysarthria or facial asymmetry.     Sensory: No sensory deficit.     Motor: No weakness.     Coordination: Coordination normal.     Gait: Gait normal.  Psychiatric:        Mood and Affect: Mood normal. Mood is not anxious.    ED Results / Procedures / Treatments   Labs (all labs ordered are listed, but only abnormal results are displayed) Labs Reviewed - No data to display  EKG None  Radiology No results found.  Procedures Procedures   Medications Ordered in ED Medications  prochlorperazine (COMPAZINE) injection 10 mg (10 mg Intramuscular Given 02/28/21 0909)  diphenhydrAMINE (BENADRYL) capsule 25 mg (25 mg Oral Given 02/28/21 0909)  dexamethasone (DECADRON) tablet 10 mg (10 mg Oral Given 02/28/21 5638)    ED Course  I have reviewed the triage vital signs and the nursing notes.  Pertinent labs & imaging results that were available during my care of the patient were reviewed by me and considered in my medical decision making (see chart for details).    MDM Rules/Calculators/A&P                           Arita Miss is here for headache.  History of migraines and seizures.  Normal vitals.  No fever.  That headache on and off for the last week.  Maybe has had an absence seizure once or twice the last several weeks as well.  Has not been taking her seizure medication in over a year but does have neurology  follow-up soon.  She states that seizures have been frequent or causing her any issues.  She states that migraine headaches been worse this week.  Offered her IV headache cocktail and fluids but ultimately she decided on IM treatment with Compazine and will give Benadryl and Decadron as well.  She denies any concern for pregnancy.  Neurologically she is intact.  She looks very comfortable and well.  Recommend that she continue to take her seizure medication until she follows up with neurology.  She was given headache cocktail and discharged in ED in good condition.  She understands return precautions.  Recommend continued use of Tylenol and ibuprofen at home.  No concern for infectious process.  Overall suspect migraine headache.  This chart was dictated using voice recognition software.  Despite best efforts to proofread,  errors can occur which can change the documentation meaning.   Final Clinical Impression(s) / ED Diagnoses Final diagnoses:  Nonintractable headache, unspecified chronicity pattern, unspecified headache type    Rx / DC Orders ED Discharge Orders     None        Virgina Norfolk, DO 02/28/21 [phone]

## 2021-02-28 NOTE — Discharge Instructions (Signed)
Follow-up with your neurologist as discussed.  Recommend that you restart your seizure medication.  Recommend 1000 mg of Tylenol every 6 hours as needed as well as 800 mg ibuprofen every 8 hours as needed for your headache.  You have been given 3 different medications to help with headache now and over the next several days.

## 2021-02-28 NOTE — ED Triage Notes (Signed)
Pt has been having absence seizures (3) in the past 2 weeks as well as constant severe headache for 1 week. Has history of these seizures, stopped taking seizure meds earlier this year, neuro unaware.

## 2021-03-17 ENCOUNTER — Encounter: Payer: Self-pay | Admitting: Physician Assistant

## 2021-03-17 ENCOUNTER — Other Ambulatory Visit: Payer: Self-pay

## 2021-03-17 ENCOUNTER — Ambulatory Visit (INDEPENDENT_AMBULATORY_CARE_PROVIDER_SITE_OTHER): Payer: 59 | Admitting: Physician Assistant

## 2021-03-17 VITALS — BP 115/76 | HR 66 | Temp 97.8°F | Ht 65.0 in | Wt 257.5 lb

## 2021-03-17 DIAGNOSIS — Z6841 Body Mass Index (BMI) 40.0 and over, adult: Secondary | ICD-10-CM

## 2021-03-17 DIAGNOSIS — Z23 Encounter for immunization: Secondary | ICD-10-CM | POA: Diagnosis not present

## 2021-03-17 DIAGNOSIS — Z7689 Persons encountering health services in other specified circumstances: Secondary | ICD-10-CM

## 2021-03-17 DIAGNOSIS — Z3009 Encounter for other general counseling and advice on contraception: Secondary | ICD-10-CM | POA: Diagnosis not present

## 2021-03-17 DIAGNOSIS — N946 Dysmenorrhea, unspecified: Secondary | ICD-10-CM

## 2021-03-17 DIAGNOSIS — N920 Excessive and frequent menstruation with regular cycle: Secondary | ICD-10-CM

## 2021-03-17 DIAGNOSIS — Z793 Long term (current) use of hormonal contraceptives: Secondary | ICD-10-CM

## 2021-03-17 MED ORDER — JUNEL FE 1/20 1-20 MG-MCG PO TABS: 1.0000 | ORAL_TABLET | Freq: Every day | ORAL | 2 refills | Status: DC

## 2021-03-17 NOTE — Patient Instructions (Signed)
So good to meet you! You are doing a great job on your health journey!  Thank you for letting me take part in your health care. Please schedule for a well woman exam with fasting labs. Call sooner if you have any concerns.  Flu shot updated in office today.

## 2021-03-17 NOTE — Progress Notes (Addendum)
Subjective:    Patient ID: Karen Simon, female    DOB: Jul 11, 1990, 30 y.o.   MRN: [id number]  Chief Complaint  Patient presents with   Establish Care    HPI 30 yo patient is in today for new patient establishment. No PCP in awhile. She is married to her husband and has two children, ages 12 & 7. 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   Current Care Team: Dr. Terrace Arabia, neurologist - hx seizures, migraines   Acute Concerns: -Menorrhagia and dysmenorrhea. -Birth control through [facility], started [place] about one year ago and doing well. Needs refills. LMP 3 weeks ago.   -Needs flu vaccine  -Obesity - Intermittent fasting, using a trainer & working out, lost 65 lbs in the last 9 months.   Chronic Concerns: See PMH listed below, as well as A/P for details on issues we specifically discussed during today's visit.     Past Medical History:  Diagnosis Date   Anemia    Asthma    Enlarged heart    H/O candidiasis    H/O varicella    Irregular periods/menstrual cycles 07/01/09   Migraines    Morbid obesity (HCC) 09/17/2015   Seizure-like activity (HCC)    Seizures (HCC)     Past Surgical History:  Procedure Laterality Date   CHOLECYSTECTOMY  03/20/2012   Procedure: LAPAROSCOPIC CHOLECYSTECTOMY;  Surgeon: Shelly Rubenstein, MD;  Location: [facility];  Service: General;  Laterality: N/A;    Family History  Problem Relation Age of Onset   Hypertension Mother    Cancer Maternal Grandmother        breast   Cancer Maternal Grandfather        colon   Seizures Father    Sleep apnea Father    Seizures Paternal Aunt        x 3 aunts   Anesthesia problems Neg Hx     Social History   Tobacco Use   Smoking status: Never   Smokeless tobacco: Never  Vaping Use   Vaping Use: Former  Substance Use Topics   Alcohol use: Never   Drug use: Yes    Types: Marijuana     Allergies  Allergen Reactions   Latex Hives   Penicillins Anaphylaxis, Itching and  Swelling   Tomato Hives and Swelling   Iodine Hives   Shellfish Allergy Hives    Review of Systems REFER TO HPI FOR PERTINENT POSITIVES AND NEGATIVES      Objective:     BP 115/76   Pulse 66   Temp 97.8 F (36.6 C)   Ht 5\' 5"  (1.651 m)   Wt 257 lb 8 oz (116.8 kg)   SpO2 99%   BMI 42.85 kg/m   Wt Readings from Last 3 Encounters:  03/17/21 257 lb 8 oz (116.8 kg)  11/04/20 261 lb 14.5 oz (118.8 kg)  01/20/20 288 lb (130.6 kg)    BP Readings from Last 3 Encounters:  03/17/21 115/76  02/28/21 121/72  01/07/21 118/67     Physical Exam Vitals and nursing note reviewed.  Constitutional:      Appearance: Normal appearance. She is obese. She is not toxic-appearing.  HENT:     Head: Normocephalic and atraumatic.     Right Ear: Tympanic membrane, ear canal and external ear normal.     Left Ear: Tympanic membrane, ear canal and external ear normal.     Nose: Nose normal.     Mouth/Throat:  Mouth: Mucous membranes are moist.  Eyes:     Extraocular Movements: Extraocular movements intact.     Conjunctiva/sclera: Conjunctivae normal.     Pupils: Pupils are equal, round, and reactive to light.  Cardiovascular:     Rate and Rhythm: Normal rate and regular rhythm.     Pulses: Normal pulses.     Heart sounds: Normal heart sounds.  Pulmonary:     Effort: Pulmonary effort is normal.     Breath sounds: Normal breath sounds.  Abdominal:     General: Abdomen is flat. Bowel sounds are normal.     Palpations: Abdomen is soft.  Musculoskeletal:        General: Normal range of motion.     Cervical back: Normal range of motion and neck supple.  Skin:    General: Skin is warm and dry.  Neurological:     General: No focal deficit present.     Mental Status: She is alert and oriented to person, place, and time.  Psychiatric:        Mood and Affect: Mood normal.        Behavior: Behavior normal.        Thought Content: Thought content normal.        Judgment: Judgment  normal.       Assessment & Plan:   Problem List Items Addressed This Visit   None Visit Diagnoses     Encounter to establish care    -  Primary   Influenza vaccine needed       Need for immunization against influenza       Relevant Orders   Flu Vaccine QUAD 59mo+IM (Fluarix, Fluzone & Alfiuria Quad PF) (Completed)        Meds ordered this encounter  Medications   JUNEL FE 1/20 1-20 MG-MCG tablet    Sig: Take 1 tablet by mouth daily.    Dispense:  28 tablet    Refill:  2   1. Encounter to establish care 2. Influenza vaccine needed 3. Need for immunization against influenza -Doing well overall, needs to schedule well woman exam -Updated vaccination today  4. Birth control counseling, menorrhagia, dysmenorrhea -Doing well, symptoms controlled on [place], refilled today  -BP to goal -No issues with current Rx; aware of possible side effects  5. Class 3 severe obesity due to excess calories without serious comorbidity with body mass index (BMI) of 40.0 to 44.9 in adult [facility]) -Still doing very well on weight loss journey -She does not need any additional help at this time    This note was prepared with assistance of Dragon voice recognition software. Occasional wrong-word or sound-a-like substitutions may have occurred due to the inherent limitations of voice recognition software.   Janda Cargo M Keddrick Wyne, PA-C

## 2021-04-16 ENCOUNTER — Other Ambulatory Visit: Payer: Self-pay

## 2021-04-16 ENCOUNTER — Ambulatory Visit (INDEPENDENT_AMBULATORY_CARE_PROVIDER_SITE_OTHER): Payer: 59 | Admitting: Physician Assistant

## 2021-04-16 VITALS — BP 120/78 | HR 52 | Temp 98.0°F | Ht 65.0 in | Wt 252.2 lb

## 2021-04-16 DIAGNOSIS — Z0000 Encounter for general adult medical examination without abnormal findings: Secondary | ICD-10-CM | POA: Diagnosis not present

## 2021-04-16 DIAGNOSIS — R001 Bradycardia, unspecified: Secondary | ICD-10-CM | POA: Diagnosis not present

## 2021-04-16 DIAGNOSIS — Z13220 Encounter for screening for lipoid disorders: Secondary | ICD-10-CM

## 2021-04-16 DIAGNOSIS — Z131 Encounter for screening for diabetes mellitus: Secondary | ICD-10-CM

## 2021-04-16 LAB — COMPREHENSIVE METABOLIC PANEL
ALT: 23 U/L (ref 0–35)
AST: 18 U/L (ref 0–37)
Albumin: 3.9 g/dL (ref 3.5–5.2)
Alkaline Phosphatase: 58 U/L (ref 39–117)
BUN: 12 mg/dL (ref 6–23)
CO2: 27 mEq/L (ref 19–32)
Calcium: 9.5 mg/dL (ref 8.4–10.5)
Chloride: 105 mEq/L (ref 96–112)
Creatinine, Ser: 0.73 mg/dL (ref 0.40–1.20)
GFR: 112.17 mL/min (ref >60.00)
Glucose, Bld: 89 mg/dL (ref 70–99)
Potassium: 4.8 mEq/L (ref 3.5–5.1)
Sodium: 137 mEq/L (ref 135–145)
Total Bilirubin: 0.3 mg/dL (ref 0.2–1.2)
Total Protein: 7.4 g/dL (ref 6.0–8.3)

## 2021-04-16 LAB — LIPID PANEL
Cholesterol: 152 mg/dL (ref 0–200)
HDL: 45.9 mg/dL (ref >39.00)
LDL Cholesterol: 94 mg/dL (ref 0–99)
NonHDL: 106.47
Total CHOL/HDL Ratio: 3
Triglycerides: 62 mg/dL (ref 0.0–149.0)
VLDL: 12.4 mg/dL (ref 0.0–40.0)

## 2021-04-16 LAB — CBC WITH DIFFERENTIAL/PLATELET
Basophils Absolute: 0 10*3/uL (ref 0.0–0.1)
Basophils Relative: 0.6 % (ref 0.0–3.0)
Eosinophils Absolute: 0.3 10*3/uL (ref 0.0–0.7)
Eosinophils Relative: 4 % (ref 0.0–5.0)
HCT: 35.7 % — ABNORMAL LOW (ref 36.0–46.0)
Hemoglobin: 11.6 g/dL — ABNORMAL LOW (ref 12.0–15.0)
Lymphocytes Relative: 29.6 % (ref 12.0–46.0)
Lymphs Abs: 1.9 10*3/uL (ref 0.7–4.0)
MCHC: 32.6 g/dL (ref 30.0–36.0)
MCV: 84.8 fl (ref 78.0–100.0)
Monocytes Absolute: 0.3 10*3/uL (ref 0.1–1.0)
Monocytes Relative: 5.1 % (ref 3.0–12.0)
Neutro Abs: 3.9 10*3/uL (ref 1.4–7.7)
Neutrophils Relative %: 60.7 % (ref 43.0–77.0)
Platelets: 270 10*3/uL (ref 150.0–400.0)
RBC: 4.21 Mil/uL (ref 3.87–5.11)
RDW: 13.3 % (ref 11.5–15.5)
WBC: 6.4 10*3/uL (ref 4.0–10.5)

## 2021-04-16 NOTE — Progress Notes (Signed)
Subjective:    Patient ID: Karen Simon, female    DOB: 1991-02-11, 30 y.o.   MRN: [id number]  Chief Complaint  Patient presents with   Annual Exam    HPI Acute concerns: No concerns  Health maintenance: Lifestyle/ exercise: Personal trainer, goes 5 days per week, does power-lifting  Nutrition: Doing well, cooks at home; rarely eats meat; enjoys fish  Mental health: Doing well Caffeine: Celsius and coffee Sleep: Doing really well  Substance use: Marijuana  Sexual activity: monogamous  Immunizations: UTD    Past Medical History:  Diagnosis Date   Anemia    Asthma    Enlarged heart    H/O candidiasis    H/O varicella    Irregular periods/menstrual cycles 07/01/09   Migraines    Morbid obesity (HCC) 09/17/2015   Seizure-like activity (HCC)    Seizures (HCC)     Past Surgical History:  Procedure Laterality Date   CHOLECYSTECTOMY  03/20/2012   Procedure: LAPAROSCOPIC CHOLECYSTECTOMY;  Surgeon: Shelly Rubenstein, MD;  Location: [facility];  Service: General;  Laterality: N/A;    Family History  Problem Relation Age of Onset   Hypertension Mother    Cancer Maternal Grandmother        breast   Cancer Maternal Grandfather        colon   Seizures Father    Sleep apnea Father    Seizures Paternal Aunt        x 3 aunts   Anesthesia problems Neg Hx     Social History   Tobacco Use   Smoking status: Never   Smokeless tobacco: Never  Vaping Use   Vaping Use: Former  Substance Use Topics   Alcohol use: Never   Drug use: Yes    Types: Marijuana     Allergies  Allergen Reactions   Latex Hives   Penicillins Anaphylaxis, Itching and Swelling   Tomato Hives and Swelling   Iodine Hives   Shellfish Allergy Hives    Review of Systems NEGATIVE UNLESS OTHERWISE INDICATED IN HPI      Objective:     BP 120/78   Pulse (!) 52   Temp 98 F (36.7 C)   Ht 5\' 5"  (1.651 m)   Wt 252 lb 3.2 oz (114.4 kg)   SpO2 99%   BMI 41.97 kg/m   Wt Readings from  Last 3 Encounters:  04/16/21 252 lb 3.2 oz (114.4 kg)  03/17/21 257 lb 8 oz (116.8 kg)  11/04/20 261 lb 14.5 oz (118.8 kg)    BP Readings from Last 3 Encounters:  04/16/21 120/78  03/17/21 115/76  02/28/21 121/72     Physical Exam Vitals and nursing note reviewed.  Constitutional:      Appearance: Normal appearance. She is normal weight. She is not toxic-appearing.  HENT:     Head: Normocephalic and atraumatic.     Right Ear: Tympanic membrane, ear canal and external ear normal.     Left Ear: Tympanic membrane, ear canal and external ear normal.     Nose: Nose normal.     Mouth/Throat:     Mouth: Mucous membranes are moist.  Eyes:     Extraocular Movements: Extraocular movements intact.     Conjunctiva/sclera: Conjunctivae normal.     Pupils: Pupils are equal, round, and reactive to light.  Cardiovascular:     Rate and Rhythm: Regular rhythm. Bradycardia present.     Pulses: Normal pulses.     Heart sounds: Normal heart  sounds. No murmur heard.   No friction rub. No gallop.  Pulmonary:     Effort: Pulmonary effort is normal.     Breath sounds: Normal breath sounds.  Abdominal:     General: Abdomen is flat. Bowel sounds are normal.     Palpations: Abdomen is soft.  Musculoskeletal:        General: Normal range of motion.     Cervical back: Normal range of motion and neck supple.  Skin:    General: Skin is warm and dry.  Neurological:     General: No focal deficit present.     Mental Status: She is alert and oriented to person, place, and time.  Psychiatric:        Mood and Affect: Mood normal.        Behavior: Behavior normal.        Thought Content: Thought content normal.        Judgment: Judgment normal.       Assessment & Plan:   Problem List Items Addressed This Visit   None Visit Diagnoses     Encounter for annual physical exam    -  Primary   Relevant Orders   CBC with Differential/Platelet   Comprehensive metabolic panel   Lipid panel   Diabetes  mellitus screening       Relevant Orders   CBC with Differential/Platelet   Comprehensive metabolic panel   Lipid panel   Screening for cholesterol level       Relevant Orders   CBC with Differential/Platelet   Comprehensive metabolic panel   Lipid panel   Sinus bradycardia           Plan: -Age-appropriate screening and counseling performed today. Will check labs and call with results. Preventive measures discussed and printed in AVS for patient.  -Pap smear next year  -Monitor HR and rhythm, she has no complaints, she has been doing great with exercise and fitness  -F/up in 1 year, she knows to call sooner if any concerns   Jeremian Whitby M Serrena Linderman, PA-C

## 2021-04-16 NOTE — Patient Instructions (Signed)
Good to see you! Please go to lab and I will call with results. Monitor your heart rate and rhythm. It was regular in office at 52 bpm, which is most likely a result of your exercises.  Call if any chest pain, dizziness, fainting, headaches, etc.

## 2021-04-20 ENCOUNTER — Telehealth: Payer: Self-pay

## 2021-04-20 NOTE — Telephone Encounter (Signed)
Patient has called back in regard to labs.   I have given her [place]'s response in regard.  Patient understood.  States she was told she has had anemia in the past but nothing was followed up with.  Please follow back up in regard.

## 2021-05-10 NOTE — Progress Notes (Signed)
PATIENT: Karen Simon DOB: 09/30/1990  REASON FOR VISIT: follow up HISTORY FROM: patient Primary Neurologist: Dr. Terrace ArabiaYan   HISTORY Karen Simon is a 31 year old female, seen in request by emergency room for evaluation of confusion spells, initial evaluation was on April 11, 2019.   I have reviewed and summarized the referring note from the referring physician.  She was previously healthy, works at [employer], presented to emergency room on April 01, 2019 with weakness confusion spells   On March 31, 2019, while shopping at [employer], she suddenly had transient confusion, lasting for few seconds, there was no loss of consciousness, next day, while she was spending time with her family, she was noted to raise her right arm, finger fidgety movement, confusion, did not know where she was, mumbling, lasting 45 minutes, she is aware of the surroundings, but could not respond to that.   Her father,2 of paternal aunts have seizure, she has children at 97 and 29 years old, her 29-year-old daughter is going through sleep study for evaluation of confusion, waking up from sleep with fidgety movement.   I personally reviewed CT head without contrast April 01, 2019 that was normal   Laboratory evaluations showed positive UDS for THC, CMP showed normal creatinine 0.9, CBC with hemoglobin of 11.3  Update August 15, 2019 SS: Here today for follow-up, she stopped Keppra, reports side effect of moodiness.  EEG was normal.  She has not scheduled MRI of the brain, due to concerns about removing facial piercings, and does not have health insurance currently.  She has continued to report seizure episodes, 3-4 since last seen.  Usually around her menstrual cycle.  With the episodes, will feel confused, spacey, involuntary movement of the right arm, speech is mumbled, did not have control of her actions.  No loss of consciousness.  She is on a diet, but has not lost any weight, no longer eats  meat.  She works part-time at [employer].  Update May 11, 2021 SS: Here today alone, lost to follow up since April 2021. Still having seizures, will stare off, is aware, finds something fixate on, continues to talk, can't get words out or may not make sense, had 1 weekly summer 2022, stopped up until November 2022. Felt to be triggered by stress, not eating? On oral contraceptive, Junel for periods, her husband had vasectomy. Keppra resulted in mood swings, never started the Lamictal. [employer] a [employer] store. She can feel spells before they come on, gets intense anxiety feeling beforehand. Will take CBD oil, doesn't notice any change. There is no oral injury or incontinence. After 20 minutes is back to normal. Had 1 spell yesterday, at parents, spells lasts for less than 5 minutes. Her dad and her aunt have seizures  MRI of the brain without contrast in November 2021 showed no abnormalities, was ordered with contrast, but she declined.Marland Kitchen.  REVIEW OF SYSTEMS: Out of a complete 14 system review of symptoms, the patient complains only of the following symptoms, and all other reviewed systems are negative.  See HPI  ALLERGIES: Allergies  Allergen Reactions   Latex Hives   Penicillins Anaphylaxis, Itching and Swelling   Tomato Hives and Swelling   Iodine Hives   Shellfish Allergy Hives    HOME MEDICATIONS: Outpatient Medications Prior to Visit  Medication Sig Dispense Refill   JUNEL FE 1/20 1-20 MG-MCG tablet Take 1 tablet by mouth daily. 28 tablet 2   MAGNESIUM PO Take 30  mg by mouth daily.     Multiple Vitamin (MULTIVITAMIN) tablet Take 1 tablet by mouth daily.     No facility-administered medications prior to visit.    PAST MEDICAL HISTORY: Past Medical History:  Diagnosis Date   Anemia    Asthma    Enlarged heart    H/O candidiasis    H/O varicella    Irregular periods/menstrual cycles 07/01/09   Migraines    Morbid obesity (HCC) 09/17/2015   Seizure-like activity (HCC)     Seizures (HCC)     PAST SURGICAL HISTORY: Past Surgical History:  Procedure Laterality Date   CHOLECYSTECTOMY  03/20/2012   Procedure: LAPAROSCOPIC CHOLECYSTECTOMY;  Surgeon: Shelly Rubenstein, MD;  Location: [facility];  Service: General;  Laterality: N/A;    FAMILY HISTORY: Family History  Problem Relation Age of Onset   Hypertension Mother    Cancer Maternal Grandmother        breast   Cancer Maternal Grandfather        colon   Seizures Father    Sleep apnea Father    Seizures Paternal Aunt        x 3 aunts   Anesthesia problems Neg Hx     SOCIAL HISTORY: Social History   Socioeconomic History   Marital status: Married    Spouse name: Not on file   Number of children: 2   Years of education: some college   Highest education level: Not on file  Occupational History   Occupation: Physicist, medical   Occupation: Food and Nutrition  Tobacco Use   Smoking status: Never   Smokeless tobacco: Never  Vaping Use   Vaping Use: Former  Substance and Sexual Activity   Alcohol use: Never   Drug use: Yes    Types: Marijuana   Sexual activity: Yes    Birth control/protection: Pill  Other Topics Concern   Not on file  Social History Narrative   Lives at home with her husband.   Right-handed.   2-4 cups caffeine per day.   Social Determinants of Health   Financial Resource Strain: Not on file  Food Insecurity: Not on file  Transportation Needs: Not on file  Physical Activity: Not on file  Stress: Not on file  Social Connections: Not on file  Intimate Partner Violence: Not on file      PHYSICAL EXAM  Vitals:   05/11/21 1110  BP: 120/63  Pulse: 70  Weight: 253 lb (114.8 kg)  Height: 5\' 5"  (1.651 m)    Body mass index is 42.1 kg/m.  Generalized: Well developed, in no acute distress   Neurological examination  Mentation: Alert oriented to time, place, history taking. Follows all commands speech and language fluent Cranial nerve II-XII: Pupils were equal round  reactive to light. Extraocular movements were full, visual field were full on confrontational test. Facial sensation and strength were normal. Head turning and shoulder shrug  were normal and symmetric. Motor: The motor testing reveals 5 over 5 strength of all 4 extremities. Good symmetric motor tone is noted throughout.  Sensory: Sensory testing is intact to soft touch on all 4 extremities. No evidence of extinction is noted.  Coordination: Cerebellar testing reveals good finger-nose-finger and heel-to-shin bilaterally.  Gait and station: Gait is normal.  Reflexes: Deep tendon reflexes are symmetric and normal bilaterally.   DIAGNOSTIC DATA (LABS, IMAGING, TESTING) - I reviewed patient records, labs, notes, testing and imaging myself where available.  Lab Results  Component Value Date  WBC 6.4 04/16/2021   HGB 11.6 (L) 04/16/2021   HCT 35.7 (L) 04/16/2021   MCV 84.8 04/16/2021   PLT 270.0 04/16/2021      Component Value Date/Time   NA 137 04/16/2021 0805   K 4.8 04/16/2021 0805   CL 105 04/16/2021 0805   CO2 27 04/16/2021 0805   GLUCOSE 89 04/16/2021 0805   BUN 12 04/16/2021 0805   CREATININE 0.73 04/16/2021 0805   CALCIUM 9.5 04/16/2021 0805   PROT 7.4 04/16/2021 0805   ALBUMIN 3.9 04/16/2021 0805   AST 18 04/16/2021 0805   ALT 23 04/16/2021 0805   ALKPHOS 58 04/16/2021 0805   BILITOT 0.3 04/16/2021 0805   GFRNONAA >60 11/04/2020 2105   GFRAA >60 04/01/2019 1851   Lab Results  Component Value Date   CHOL 152 04/16/2021   HDL 45.90 04/16/2021   LDLCALC 94 04/16/2021   TRIG 62.0 04/16/2021   CHOLHDL 3 04/16/2021   No results found for: HGBA1C No results found for: VITAMINB12 Lab Results  Component Value Date   TSH 1.41 11/26/2010   ASSESSMENT AND PLAN 31 y.o. year old female  has a past medical history of Anemia, Asthma, Enlarged heart, H/O candidiasis, H/O varicella, Irregular periods/menstrual cycles (07/01/09), Migraines, Morbid obesity (HCC) (09/17/2015),  Seizure-like activity (HCC), and Seizures (HCC). here with:  1.  Probable complex partial seizure  -Reported continued seizure spells -Check EEG  -Start Lamictal, working up to 100 mg twice a day, sent in the 25 mg tablet for 4 week titration, will then reach out to me and I will send in the 100 mg tablet, advised to watch for rash, ensure she drinks plenty of water -Could not tolerate Keppra due to side effect of moodiness -MRI of the brain in November 2021 was unremarkable, however was done without contrast after she refused, may need to be considered at some point -Family history of her father and aunt having seizures  -EEG was normal -She is currently on birth control to help with her menstrual cycle, she is not planning pregnancy, her husband had a vasectomy -Advised she should not drive until she is seizure-free for 6 months -Recommended to document spells, have her family take a video and send to me, send progress report via MyChart, we will follow-up in 6 months or sooner if needed with Dr. Terrace Arabia   Meds ordered this encounter  Medications   lamoTRIgine (LAMICTAL) 25 MG tablet    Sig: 1 tablet twice a day for the first week 2 tablets twice a day for the second week 3 tablets twice a day for the third week 4 tablets twice a day for the fourth week  For total of 140 tablets  After finish titration with small dose of lamotrigine 25 mg, change to lamotrigine 100 mg twice a day    Dispense:  140 tablet    Refill:  0    Order Specific Question:   Supervising Provider    Answer:   Levert Feinstein [[id number]]   Margie Ege, AGNP-C, DNP 05/11/2021, 11:37 AM [facility] [address] [place], [place] [address] [phone]

## 2021-05-11 ENCOUNTER — Other Ambulatory Visit: Payer: Self-pay

## 2021-05-11 ENCOUNTER — Encounter: Payer: Self-pay | Admitting: Neurology

## 2021-05-11 ENCOUNTER — Ambulatory Visit (INDEPENDENT_AMBULATORY_CARE_PROVIDER_SITE_OTHER): Payer: 59 | Admitting: Neurology

## 2021-05-11 VITALS — BP 120/63 | HR 70 | Ht 65.0 in | Wt 253.0 lb

## 2021-05-11 DIAGNOSIS — G40209 Localization-related (focal) (partial) symptomatic epilepsy and epileptic syndromes with complex partial seizures, not intractable, without status epilepticus: Secondary | ICD-10-CM

## 2021-05-11 MED ORDER — LAMOTRIGINE 25 MG PO TABS: ORAL_TABLET | ORAL | 0 refills | Status: DC

## 2021-05-11 NOTE — Patient Instructions (Signed)
Start Lamictal working up to 100 mg twice daily 1 tablet twice a day for the first week 2 tablets twice a day for the second week 3 tablets twice a day for the third week 4 tablets twice a day for the fourth week  For total of 140 tablets  After finish titration with small dose of lamotrigine 25 mg, change to lamotrigine 100 mg twice a day  Call me once you finished the titration to send in a new script for 100 mg twice daily  Check EEG  Document spells, take a Video  No driving until seizure free 6 months  See you back in 6 months

## 2021-05-12 NOTE — Progress Notes (Signed)
Chart reviewed, agree above plan ?

## 2021-05-13 ENCOUNTER — Ambulatory Visit (INDEPENDENT_AMBULATORY_CARE_PROVIDER_SITE_OTHER): Payer: 59 | Admitting: Neurology

## 2021-05-13 DIAGNOSIS — G40209 Localization-related (focal) (partial) symptomatic epilepsy and epileptic syndromes with complex partial seizures, not intractable, without status epilepticus: Secondary | ICD-10-CM | POA: Diagnosis not present

## 2021-05-26 ENCOUNTER — Ambulatory Visit: Payer: 59 | Admitting: Neurology

## 2021-06-07 NOTE — Procedures (Signed)
° °  HISTORY: 31 year old female presented with confusion spell  TECHNIQUE:  This is a routine 16 channel EEG recording with one channel devoted to a limited EKG recording.  It was performed during wakefulness, drowsiness and asleep.  Hyperventilation and photic stimulation were performed as activating procedures.  There are minimum muscle and movement artifact noted.  Upon maximum arousal, posterior dominant waking rhythm consistent of rhythmic alpha range activity, with frequency of 10 hz. Activities are symmetric over the bilateral posterior derivations and attenuated with eye opening.  Hyperventilation produced mild/moderate buildup with higher amplitude and the slower activities noted.  Photic stimulation did not alter the tracing.  During EEG recording, patient developed drowsiness and no deeper stage of sleep was achieved  During EEG recording, there was no epileptiform discharge noted.  EKG demonstrate sinus rhythm, with heart rate of 56 bpm  CONCLUSION: This is a  normal awake EEG.  There is no electrodiagnostic evidence of epileptiform discharge.  Levert Feinstein, M.D. Ph.D.  [facility] [address] [place], [place] [address] Phone: [phone] Fax:      [phone]

## 2021-06-11 ENCOUNTER — Other Ambulatory Visit: Payer: Self-pay | Admitting: Physician Assistant

## 2021-06-16 ENCOUNTER — Other Ambulatory Visit: Payer: Self-pay | Admitting: Physician Assistant

## 2021-07-17 ENCOUNTER — Encounter: Payer: Self-pay | Admitting: Physician Assistant

## 2021-07-23 ENCOUNTER — Encounter: Payer: Self-pay | Admitting: Family Medicine

## 2021-07-23 ENCOUNTER — Ambulatory Visit (INDEPENDENT_AMBULATORY_CARE_PROVIDER_SITE_OTHER): Payer: 59 | Admitting: Family Medicine

## 2021-07-23 ENCOUNTER — Other Ambulatory Visit: Payer: Self-pay

## 2021-07-23 VITALS — BP 110/60 | HR 70 | Temp 97.6°F | Ht 65.0 in | Wt 252.4 lb

## 2021-07-23 DIAGNOSIS — R1030 Lower abdominal pain, unspecified: Secondary | ICD-10-CM | POA: Diagnosis not present

## 2021-07-23 LAB — POCT URINALYSIS DIPSTICK
Bilirubin, UA: NEGATIVE
Blood, UA: NEGATIVE
Glucose, UA: NEGATIVE
Ketones, UA: NEGATIVE
Leukocytes, UA: NEGATIVE
Nitrite, UA: NEGATIVE
Protein, UA: NEGATIVE
Spec Grav, UA: 1.01 (ref 1.010–1.025)
Urobilinogen, UA: 0.2 E.U./dL
pH, UA: 7 (ref 5.0–8.0)

## 2021-07-23 LAB — POCT URINE PREGNANCY: Preg Test, Ur: NEGATIVE

## 2021-07-23 NOTE — Patient Instructions (Signed)
It was very nice to see you today! ? ?Aleve twice daily for 1 wk.  Decrease core exercises.   ? ? ?PLEASE NOTE: ? ?If you had any lab tests please let us know if you have not heard back within a few days. You may see your results on MyChart before we have a chance to review them but we will give you a call once they are reviewed by [place]. If we ordered any referrals today, please let us know if you have not heard from their office within the next week.  ? ?Please try these tips to maintain a healthy lifestyle: ? ?Eat most of your calories during the day when you are active. Eliminate processed foods including packaged sweets (pies, cakes, cookies), reduce intake of potatoes, white bread, white pasta, and white rice. Look for whole grain options, oat flour or almond flour. ? ?Each meal should contain half fruits/vegetables, one quarter protein, and one quarter carbs (no bigger than a computer mouse). ? ?Cut down on sweet beverages. This includes juice, soda, and sweet tea. Also watch fruit intake, though this is a healthier sweet option, it still contains natural sugar! Limit to 3 servings daily. ? ?Drink at least 1 glass of water with each meal and aim for at least 8 glasses per day ? ?Exercise at least 150 minutes every week.   ?

## 2021-07-23 NOTE — Progress Notes (Signed)
? ?Subjective:  ? ? ? Patient ID: Karen Simon, female    DOB: [birth year], 31 y.o.   MRN: [id number] ? ?Chief Complaint  ?Patient presents with  ? Abdominal Pain  ?  Really bad cramping 1 week and half ago ?Bottom of abdomen  ? Menstrual Problem  ?  Was on birth control, but stopped due to having irregular periods  ? ? ?HPI ?Sopped ocp end of Jan-husb has vasec.  ?Sz disorder-hasn't started Lamictal yet.  No sz since Feb ? ?Abd pain-cramps suprapubic.  Constant around 6/-7/10.  Will be constant most of time, small times of none.  No change w/urination/bm ?Occ vomit 2-3x/mo for 1 yr. Seems to be shellfish.  No diarrhea/dysuria/f/c.  ?LMP 2/28.  But stopped ocp end of Jan. Strange bleeding then.   No rad.  Took Aleve-helps some but not wanting to take regularly ? ?Health Maintenance Due  ?Topic Date Due  ? Hepatitis C Screening  Never done  ? PAP-Cervical Cytology Screening  03/25/2017  ? PAP SMEAR-Modifier  03/25/2017  ? COVID-19 Vaccine (3 - Moderna risk series) 11/15/2019  ? ? ?Past Medical History:  ?Diagnosis Date  ? Anemia   ? Asthma   ? Enlarged heart   ? H/O candidiasis   ? H/O varicella   ? Irregular periods/menstrual cycles 07/01/09  ? Migraines   ? Morbid obesity (HCC) 09/17/2015  ? Seizure-like activity (HCC)   ? Seizures (HCC)   ? ? ?Past Surgical History:  ?Procedure Laterality Date  ? CHOLECYSTECTOMY  03/20/2012  ? Procedure: LAPAROSCOPIC CHOLECYSTECTOMY;  Surgeon: Shelly Rubenstein, MD;  Location: [facility];  Service: General;  Laterality: N/A;  ? ? ?Outpatient Medications Prior to Visit  ?Medication Sig Dispense Refill  ? MAGNESIUM PO Take 30 mg by mouth daily.    ? Multiple Vitamin (MULTIVITAMIN) tablet Take 1 tablet by mouth daily.    ? JUNEL FE 1/20 1-20 MG-MCG tablet Take 1 tablet by mouth once daily (Patient not taking: Reported on 07/23/2021) 28 tablet 0  ? lamoTRIgine (LAMICTAL) 25 MG tablet 1 tablet twice a day for the first week ?2 tablets twice a day for the second week ?3 tablets twice a  day for the third week ?4 tablets twice a day for the fourth week ? ?For total of 140 tablets ? ?After finish titration with small dose of lamotrigine 25 mg, change to lamotrigine 100 mg twice a day (Patient not taking: Reported on 07/23/2021) 140 tablet 0  ? ?No facility-administered medications prior to visit.  ? ? ?Allergies  ?Allergen Reactions  ? Latex Hives  ? Penicillins Anaphylaxis, Itching and Swelling  ? Tomato Hives and Swelling  ? Iodine Hives  ? Shellfish Allergy Hives  ? ?ROS neg/noncontributory except as noted HPI/below ? ? ?   ?Objective:  ?  ? ?BP 110/60   Pulse 70   Temp 97.6 ?F (36.4 ?C) (Temporal)   Ht 5\' 5"  (1.651 m)   Wt 252 lb 6 oz (114.5 kg)   LMP 07/08/2021 (Approximate)   SpO2 98%   BMI 42.00 kg/m?  ?Wt Readings from Last 3 Encounters:  ?07/23/21 252 lb 6 oz (114.5 kg)  ?05/11/21 253 lb (114.8 kg)  ?04/16/21 252 lb 3.2 oz (114.4 kg)  ? ? ?Physical Exam  ? ?Gen: WDWN NAD MOAAF ?HEENT: NCAT, conjunctiva not injected, sclera nonicteric ?NECK:  supple, no thyromegaly, no nodes, no carotid bruits ?CARDIAC: RRR, S1S2+, no murmur. DP 2+B ?LUNGS: CTAB. No wheezes ?ABDOMEN:  BS+,  soft, NTND, No HSM, no masses.  No CVAT.  Obturator neg.  Pain to lie back and get up.  ?EXT:  no edema ?MSK: no gross abnormalities.  ?NEURO: A&O x3.  CN II-XII intact.  ?PSYCH: normal mood. Good eye contact ? ?Results for orders placed or performed in visit on 07/23/21  ?POCT urinalysis dipstick  ?Result Value Ref Range  ? Color, UA yellow   ? Clarity, UA clear   ? Glucose, UA Negative Negative  ? Bilirubin, UA negative   ? Ketones, UA negative   ? Spec Grav, UA 1.010 1.010 - 1.025  ? Blood, UA negative   ? pH, UA 7.0 5.0 - 8.0  ? Protein, UA Negative Negative  ? Urobilinogen, UA 0.2 0.2 or 1.0 E.U./dL  ? Nitrite, UA negative   ? Leukocytes, UA Negative Negative  ? Appearance    ? Odor    ?POCT urine pregnancy  ?Result Value Ref Range  ? Preg Test, Ur Negative Negative  ?  ? ? ? ? ?   ?Assessment & Plan:  ? ?Problem  List Items Addressed This Visit   ?None ?Visit Diagnoses   ? ? Lower abdominal pain    -  Primary  ? Relevant Orders  ? POCT urinalysis dipstick  ? POCT urine pregnancy  ? US Pelvic Complete With Transvaginal  ? ?  ? Lower abd pain-suspect more muscular but also has DUB and some anemia-will check ua and upreg.  Hold on labs.  Take bid aleve and decrease core exercises for now.  Will check u/s pelvis as irreg bleeding, anemia and pain.   ? ?No orders of the defined types were placed in this encounter. ? ? ?Angelena Sole, MD ? ?

## 2021-08-23 ENCOUNTER — Other Ambulatory Visit: Payer: Self-pay

## 2021-08-24 ENCOUNTER — Ambulatory Visit: Payer: 59 | Admitting: Physician Assistant

## 2021-08-27 ENCOUNTER — Ambulatory Visit (INDEPENDENT_AMBULATORY_CARE_PROVIDER_SITE_OTHER): Payer: 59 | Admitting: Sports Medicine

## 2021-08-27 VITALS — BP 110/80 | HR 52 | Ht 65.0 in | Wt 246.0 lb

## 2021-08-27 DIAGNOSIS — M25562 Pain in left knee: Secondary | ICD-10-CM | POA: Diagnosis not present

## 2021-08-27 DIAGNOSIS — G8929 Other chronic pain: Secondary | ICD-10-CM

## 2021-08-27 DIAGNOSIS — M25511 Pain in right shoulder: Secondary | ICD-10-CM

## 2021-08-27 MED ORDER — MELOXICAM 15 MG PO TABS: 15.0000 mg | ORAL_TABLET | Freq: Every day | ORAL | 0 refills | Status: DC

## 2021-08-27 NOTE — Progress Notes (Signed)
? ? Karen Simon D.Judd Gaudier ?[facility] ?[address] [place] [address] ?Phone: [phone] ?  ?Assessment and Plan:   ?  ?1. Chronic right shoulder pain ?-Chronic with exacerbation, initial sports medicine visit ?- Most consistent with rotator cuff pathology versus possible subluxation.  I do not believe that patient truly dislocated her shoulder as patient was able to move her arm at all times during both episodes, and did not experience radicular symptoms ?-- Start meloxicam 15 mg daily x2 weeks.  If still having pain after 2 weeks, complete 3rd-week of meloxicam. May use remaining meloxicam as needed once daily for pain control.  Do not to use additional NSAIDs while taking meloxicam.  May use Tylenol [phone] mg 2 to 3 times a day for breakthrough pain. ?- Start HEP for rotator cuff ? ?2. Chronic pain of left knee ?-Chronic with exacerbation, initial sports medicine visit ?- Most consistent with patellofemoral syndrome based on HPI, physical exam, imaging ?-- Start meloxicam 15 mg daily x2 weeks.  If still having pain after 2 weeks, complete 3rd-week of meloxicam. May use remaining meloxicam as needed once daily for pain control.  Do not to use additional NSAIDs while taking meloxicam.  May use Tylenol [phone] mg 2 to 3 times a day for breakthrough pain. ?- Start HEP for patellofemoral syndrome ?  ?Pertinent previous records reviewed include x-ray left knee 03/2020, x-ray right shoulder 03/2020 ?  ?Follow Up: 3 weeks for reevaluation.  Could consider CSI versus PT versus repeat imaging based on symptoms ?  ?Subjective:   ?I, Karen Simon, am serving as a Neurosurgeon for Doctor [employer] ? ?Chief Complaint: right shoulder ,left knee pain  ? ?HPI:  ? ?08/27/21 ?Patient is a 31 year old female complaining of right shoulder and left knee pain. Patient states that last November she got into weightlifting she says she had a shoulder dislocation, started doing band work  yesterday she dislocated her shoulder , no numbness or tingling in the shoulder, no clicking or popping, no radiating pain, her knee swells and its uncomfortable, no MOI three weeks ago she was lifting heavy lower body, no numbness tingling, anterior knee pain , no radiating pain , has been taking tylenol  ? ?Relevant Historical Information: None pertinent ? ?Additional pertinent review of systems negative. ? ? ?Current Outpatient Medications:  ?  JUNEL FE 1/20 1-20 MG-MCG tablet, Take 1 tablet by mouth once daily, Disp: 28 tablet, Rfl: 0 ?  lamoTRIgine (LAMICTAL) 25 MG tablet, 1 tablet twice a day for the first week 2 tablets twice a day for the second week 3 tablets twice a day for the third week 4 tablets twice a day for the fourth week  For total of 140 tablets  After finish titration with small dose of lamotrigine 25 mg, change to lamotrigine 100 mg twice a day, Disp: 140 tablet, Rfl: 0 ?  MAGNESIUM PO, Take 30 mg by mouth daily., Disp: , Rfl:  ?  meloxicam (MOBIC) 15 MG tablet, Take 1 tablet (15 mg total) by mouth daily., Disp: 30 tablet, Rfl: 0 ?  Multiple Vitamin (MULTIVITAMIN) tablet, Take 1 tablet by mouth daily., Disp: , Rfl:   ? ?Objective:   ?  ?Vitals:  ? 08/27/21 0944  ?BP: 110/80  ?Pulse: (!) 52  ?SpO2: 96%  ?Weight: 246 lb (111.6 kg)  ?Height: 5\' 5"  (1.651 m)  ?  ?  ?Body mass index is 40.94 kg/m?.  ?  ?Physical Exam:   ? ?  General:  awake, alert oriented, no acute distress nontoxic ?Skin: no suspicious lesions or rashes ?Neuro:sensation intact, no deficits, strength 5/5 with no deficits, no atrophy, normal muscle tone ?Psych: No signs of anxiety, depression or other mood disorder ? ?Left knee: ?No swelling ?No deformity ?Neg fluid wave, joint milking ?ROM Flex 110 , Ext 0  ?Mild TTP patella ?NTTP over the quad tendon, medial fem condyle, lat fem condyle,  , plica, patella tendon, tibial tuberostiy, fibular head, posterior fossa, pes anserine bursa, gerdy's tubercle, medial jt line, lateral jt  line ?Neg anterior and posterior drawer ?Neg lachman ?Neg sag sign ?Negative varus stress ?Negative valgus stress ?Negative McMurray ?  ? ?Gait normal  ?  ? ?Right shoulder: no deformity, swelling or muscle wasting ?No scapular winging ?FF 170, abd 160, int 30, ext 80 ?TTP anterior shoulder musculature ?NTTP over the [place], clavicle, ac, coracoid, biceps groove, humerus, deltoid, trapezius, cervical spine ?Positive neer, hawkings, empty can, obriens, ?Negative subscap liftoff, speeds, crossarm ?Neg ant drawer, sulcus sign, apprehension ?Negative Spurling's test bilat ?FROM of neck  ? ?Electronically signed by:  ?Karen Simon D.Judd Gaudier ?[facility] ?10:18 AM 08/27/21 ?

## 2021-08-27 NOTE — Patient Instructions (Addendum)
Good to see you  ?Start meloxicam 15 mg daily x2 weeks.  If still having pain after 2 weeks, complete 3rd-week of meloxicam. May use remaining meloxicam as needed once daily for pain control.  Do not to use additional NSAIDs while taking meloxicam.  May use Tylenol [phone] mg 2 to 3 times a day for breakthrough pain. ?Rotator cuff/ patellar femoral HEP ?Recommend 1 week of no weight lifting start back at 50% for day 1 ?3 week follow  ? ? ?

## 2021-08-30 ENCOUNTER — Telehealth: Payer: Self-pay | Admitting: Physician Assistant

## 2021-08-30 NOTE — Telephone Encounter (Signed)
FYI, patient scheduled tomorrow.

## 2021-08-30 NOTE — Telephone Encounter (Signed)
Pt could not come in today but is scheduled on 4/25 at 1:30 with [facility] ? ?Patient ?Name: ?Karen H ?ENDERSON ?Gender: Female ?DOB: [birth year] ?Age: 31 Y 86 M 21 D ?Return ?Phone ?Number: ?[id number] ?(Primary), ?[phone] ?(Secondary) ?Address: ?City/ ?State/ ?Zip: ?[place] ? 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 ?Translation No ?Nurse Assessment ?Nurse: Lenon Curt, RN, Melanie Date/Time Eilene Ghazi Time): 08/29/2021 7:16:06 PM ?Confirm and document reason for call. If ?symptomatic, describe symptoms. ?---Caller states that she is having tingling and ?numbness in her leg that started a few days ago. Sports ?med doctor, for dislocated shoulder during a workout ?and knee pain but has progressed to intermittent numb/ ?tingling. Currently resting for the evening. ?Does the patient have any new or worsening ?symptoms? ---Yes ?Will a triage be completed? ---Yes ?Related visit to physician within the last 2 weeks? ---Yes ?Does the PT have any chronic conditions? (i.e. ?diabetes, asthma, this includes High risk factors for ?pregnancy, etc.) ?---No ?Is the patient pregnant or possibly pregnant? (Ask ?all females between the ages of 77-55) ---No ?Is this a behavioral health or substance abuse call? ---No ?Guidelines ?Guideline Title Affirmed Question Affirmed Notes Nurse Date/Time (Eastern ?Time) ?Neurologic Deficit [1] Tingling (e.g., ?pins and needles) ?of the face, arm / ?hand, or leg / foot on ?one side of the body ?AND [2]  present ?Lenon Curt, RN, Melanie 08/29/2021 7:18:06 ?PM ?Guidelines ?Guideline Title Affirmed Question Affirmed Notes Nurse Date/Time (Eastern ?Time) ?now (Exceptions: ?Chronic or recurrent ?symptom lasting > ?4 weeks; or tingling ?from known cause, ?such as: bumped ?elbow, carpal tunnel ?syndrome, pinched ?nerve, frostbite.) ?Disp. Time (Eastern ?Time) Disposition Final User ?08/29/2021 7:21:55 PM See HCP within 4 Hours (or ?PCP triage) ?Yes Lenon Curt, RN, Threasa Beards ?Caller Disagree/Comply Comply ?Caller Understands Yes ?PreDisposition Call Doctor ?Care Advice Given Per Guideline ?SEE HCP (OR PCP TRIAGE) WITHIN 4 HOURS: * IF OFFICE WILL BE CLOSED AND NO PCP (PRIMARY CARE ?PROVIDER) SECOND-LEVEL TRIAGE: You need to be seen within the next 3 or 4 hours. A nearby Urgent Care Center [facility]) is ?often a good source of care. Another choice is to go to the ED. Go sooner if you become worse. * ED: Patients who may need surgery ?or hospital admission need to be sent to an ED. So do most patients with serious symptoms or complex medical problems. * UCC: ?Some UCCs can manage patients who are stable and have less serious symptoms (e.g., minor illnesses and injuries). The triager ?must know the [facility] capabilities before sending a patient there. If unsure, call ahead. CALL BACK IF: * You become worse CARE ?ADVICE given per Neurologic Deficit (Adult) guideline. ?Comments ?User: Erik Obey, RN Date/Time Eilene Ghazi Time): 08/29/2021 7:18:36 PM ?left leg weakness but does not give out ?User: Erik Obey, RN Date/Time Eilene Ghazi Time): 08/29/2021 7:22:50 PM ?Not so much numbness more tingling and uncomfortable sensations, not painful ?User: Erik Obey, RN Date/Time Eilene Ghazi Time): 08/29/2021 7:23:14 PM ?May wait until tomorrow to be seen. ?Referrals ?GO TO FACILITY UNDECIDED ?

## 2021-08-30 NOTE — Telephone Encounter (Signed)
Noted and agreed, thank you. 

## 2021-08-31 ENCOUNTER — Encounter: Payer: Self-pay | Admitting: Physician Assistant

## 2021-08-31 ENCOUNTER — Ambulatory Visit (INDEPENDENT_AMBULATORY_CARE_PROVIDER_SITE_OTHER): Payer: 59 | Admitting: Physician Assistant

## 2021-08-31 VITALS — Ht 65.0 in

## 2021-08-31 DIAGNOSIS — R202 Paresthesia of skin: Secondary | ICD-10-CM | POA: Diagnosis not present

## 2021-08-31 NOTE — Progress Notes (Signed)
? ?Subjective:  ? ? Patient ID: Karen Simon, female    DOB: [birth year], 31 y.o.   MRN: [id number] ? ?Chief Complaint  ?Patient presents with  ? Tingling  ?  Right leg. She assumed it was due to a sports injury in her knee, but states symptoms have worsened. She was seen in Ortho and is taking Meloxicam for her shoulder. She states this is not helping her symptoms of numbness and tingling.    ? Numbness  ? ? ?HPI ?Patient is in today for tingling / numbness feeling in left leg x 3-4 days ?Worse after gym or standing at work. ?Left inferior knee wrapping around L calf down into leg and toes.  ?Exercise daily. No known injury. No new exercises or changes in intensity or frequency. Sleeps ok, but the feeling is uncomfortable.  ?Meloxicam 15 mg daily. No other treatments. Currently working with Dr. Jean Rosenthal. ? ?Past Medical History:  ?Diagnosis Date  ? Anemia   ? Asthma   ? Enlarged heart   ? H/O candidiasis   ? H/O varicella   ? Irregular periods/menstrual cycles 07/01/09  ? Migraines   ? Morbid obesity (HCC) 09/17/2015  ? Seizure-like activity (HCC)   ? Seizures (HCC)   ? ? ?Past Surgical History:  ?Procedure Laterality Date  ? CHOLECYSTECTOMY  03/20/2012  ? Procedure: LAPAROSCOPIC CHOLECYSTECTOMY;  Surgeon: Shelly Rubenstein, MD;  Location: [facility];  Service: General;  Laterality: N/A;  ? ? ?Family History  ?Problem Relation Age of Onset  ? Hypertension Mother   ? Cancer Maternal Grandmother   ?     breast  ? Cancer Maternal Grandfather   ?     colon  ? Seizures Father   ? Sleep apnea Father   ? Seizures Paternal Aunt   ?     x 3 aunts  ? Anesthesia problems Neg Hx   ? ? ?Social History  ? ?Tobacco Use  ? Smoking status: Never  ? Smokeless tobacco: Never  ?Vaping Use  ? Vaping Use: Former  ?Substance Use Topics  ? Alcohol use: Never  ? Drug use: Yes  ?  Types: Marijuana  ?  ? ?Allergies  ?Allergen Reactions  ? Latex Hives  ? Penicillins Anaphylaxis, Itching and Swelling  ? Tomato Hives and Swelling  ? Iodine Hives   ? Shellfish Allergy Hives  ? ? ?Review of Systems ?NEGATIVE UNLESS OTHERWISE INDICATED IN HPI ? ? ?   ?Objective:  ?  ? ?Ht 5\' 5"  (1.651 m)   BMI 40.94 kg/m?  ? ?Wt Readings from Last 3 Encounters:  ?08/27/21 246 lb (111.6 kg)  ?07/23/21 252 lb 6 oz (114.5 kg)  ?05/11/21 253 lb (114.8 kg)  ? ? ?BP Readings from Last 3 Encounters:  ?08/27/21 110/80  ?07/23/21 110/60  ?05/11/21 120/63  ?  ? ?Physical Exam ?Constitutional:   ?   Appearance: Normal appearance. She is obese.  ?Musculoskeletal:     ?   General: No swelling, tenderness, deformity or signs of injury. Normal range of motion.  ?   Lumbar back: Negative right straight leg raise test and negative left straight leg raise test.  ?   Left knee: No swelling. Normal range of motion. No tenderness.  ?   Right lower leg: No edema.  ?   Left lower leg: No edema.  ?Skin: ?   General: Skin is warm.  ?Neurological:  ?   General: No focal deficit present.  ?   Mental Status:  She is alert.  ?   Motor: No weakness.  ?   Gait: Gait normal.  ? ? ?   ?Assessment & Plan:  ? ?Problem List Items Addressed This Visit   ?None ?Visit Diagnoses   ? ? Paresthesia of left leg    -  Primary  ? ?  ? ? ?1. Paresthesia of left leg ?I personally messaged Dr. Jean Rosenthal today regarding patient's concerns. Fairly recent XRAY L spine and L knee w/o significant findings. No focal neurologic findings on exam. Elected to give Meloxicam 15 mg more time. F/up with him as scheduled or sooner if any sudden changes.  ? ? ? ?This note was prepared with assistance of Conservation officer, historic buildings. Occasional wrong-word or sound-a-like substitutions may have occurred due to the inherent limitations of voice recognition software. ? ? ?Cyana Shook M Aodhan Scheidt, PA-C ?

## 2021-09-16 NOTE — Progress Notes (Signed)
? ? Karen Sells D.Judd Simon ?[facility] ?[address] [place] [address] ?Phone: [phone] ?  ?Assessment and Plan:   ?  ?1. Chronic right shoulder pain ?2. Chronic pain of left knee ?-Chronic with exacerbation, subsequent visit ?- Near resolution of acute flare of chronic pain after completion of 2 to 3-week course of meloxicam ?- Discontinue daily meloxicam and may use remainder as needed ?- Encouraged to continue home exercise program for both knee and shoulder to prevent recurrence of pain ?  ?Pertinent previous records reviewed include none ?  ?Follow Up: As needed if no improvement or worsening of symptoms ?  ?Subjective:   ?I, [name], am serving as a Neurosurgeon for [employer] ?  ?Chief Complaint: right shoulder ,left knee pain  ?  ?HPI:  ?  ?08/27/21 ?Patient is a 31 year old female complaining of right shoulder and left knee pain. Patient states that last November she got into weightlifting she says she had a shoulder dislocation, started doing band work yesterday she dislocated her shoulder , no numbness or tingling in the shoulder, no clicking or popping, no radiating pain, her knee swells and its uncomfortable, no MOI three weeks ago she was lifting heavy lower body, no numbness tingling, anterior knee pain , no radiating pain , has been taking tylenol  ? ?5/12/023 ?Patient states she is great  ? ?Relevant Historical Information: None pertinent ? ?Additional pertinent review of systems negative. ? ? ?Current Outpatient Medications:  ?  JUNEL FE 1/20 1-20 MG-MCG tablet, Take 1 tablet by mouth once daily, Disp: 28 tablet, Rfl: 0 ?  lamoTRIgine (LAMICTAL) 25 MG tablet, 1 tablet twice a day for the first week 2 tablets twice a day for the second week 3 tablets twice a day for the third week 4 tablets twice a day for the fourth week  For total of 140 tablets  After finish titration with small dose of lamotrigine 25 mg, change to lamotrigine 100 mg twice a  day, Disp: 140 tablet, Rfl: 0 ?  MAGNESIUM PO, Take 30 mg by mouth daily., Disp: , Rfl:  ?  meloxicam (MOBIC) 15 MG tablet, Take 1 tablet (15 mg total) by mouth daily., Disp: 30 tablet, Rfl: 0 ?  Multiple Vitamin (MULTIVITAMIN) tablet, Take 1 tablet by mouth daily., Disp: , Rfl:   ? ?Objective:   ?  ?Vitals:  ? 09/17/21 0802  ?BP: 118/80  ?Pulse: 75  ?SpO2: 99%  ?Weight: 242 lb (109.8 kg)  ?Height: 5\' 5"  (1.651 m)  ?  ?  ?Body mass index is 40.27 kg/m?.  ?  ?Physical Exam:   ? ?General:  awake, alert oriented, no acute distress nontoxic ?Skin: no suspicious lesions or rashes ?Neuro:sensation intact, no deficits, strength 5/5 with no deficits, no atrophy, normal muscle tone ?Psych: No signs of anxiety, depression or other mood disorder ? ?Left knee: ?No swelling ?No deformity ?Neg fluid wave, joint milking ?ROM Flex 110 , Ext 0  ?NTTP over the quad tendon, medial fem condyle, lat fem condyle, patella, plica, patella tendon, tibial tuberostiy, fibular head, posterior fossa, pes anserine bursa, gerdy's tubercle, medial jt line, lateral jt line ?Neg anterior and posterior drawer ?Neg lachman ?Neg sag sign ?Negative varus stress ?Negative valgus stress ?Negative McMurray ?Negative Thessaly ? ?Gait normal  ?  ? ?Right shoulder: no deformity, swelling or muscle wasting ?No scapular winging ?FF 180, abd 180, int 0, ext 90 ?NTTP over the [place], clavicle, ac, coracoid, biceps groove,  humerus, deltoid, trapezius, cervical spine ?Neg neer, hawkings, empty can, subscap liftoff, speeds, obriens, crossarm ?Neg ant drawer, sulcus sign, apprehension ?Negative Spurling's test bilat ?FROM of neck  ? ?Electronically signed by:  ?Karen Sells D.Judd Simon ?[facility] ?8:08 AM 09/17/21 ?

## 2021-09-17 ENCOUNTER — Ambulatory Visit (INDEPENDENT_AMBULATORY_CARE_PROVIDER_SITE_OTHER): Payer: 59 | Admitting: Sports Medicine

## 2021-09-17 VITALS — BP 118/80 | HR 75 | Ht 65.0 in | Wt 242.0 lb

## 2021-09-17 DIAGNOSIS — G8929 Other chronic pain: Secondary | ICD-10-CM | POA: Diagnosis not present

## 2021-09-17 DIAGNOSIS — M25562 Pain in left knee: Secondary | ICD-10-CM

## 2021-09-17 DIAGNOSIS — M25511 Pain in right shoulder: Secondary | ICD-10-CM

## 2021-09-17 NOTE — Patient Instructions (Signed)
Good to see you ?Use remainder of meloxicam as needed   ?As needed follow up  ?

## 2021-09-19 ENCOUNTER — Ambulatory Visit (HOSPITAL_COMMUNITY)
Admission: EM | Admit: 2021-09-19 | Discharge: 2021-09-19 | Disposition: A | Discharge status: Home / Self Care | Payer: 59 | Attending: Internal Medicine | Admitting: Internal Medicine

## 2021-09-19 ENCOUNTER — Encounter (HOSPITAL_COMMUNITY): Payer: Self-pay | Admitting: Emergency Medicine

## 2021-09-19 DIAGNOSIS — R070 Pain in throat: Secondary | ICD-10-CM | POA: Insufficient documentation

## 2021-09-19 LAB — POCT RAPID STREP A, ED / UC: Streptococcus, Group A Screen (Direct): NEGATIVE

## 2021-09-19 NOTE — ED Provider Notes (Signed)
?[facility] ? ? ? ?CSN: [id number] ?Arrival date & time: 09/19/21  1617 ? ? ?  ? ?History   ?Chief Complaint ?No chief complaint on file. ? ? ?HPI ?Karen Simon is a 31 y.o. female.  ? ?Patient presents to urgent care with tonsil stones that started to her left tonsil and spread to her right tonsil for the last 4 days.  She has been using hot tea to try to help with the swelling in the back of her throat and the stones but nothing has been helping.  She denies any sore throat, painful swallowing, nausea, abdominal pain, ear pain, eye pain, dental abscesses, and headache.  She has never gotten tonsil stones in the past.  She sees a dentist every 6 months.  Denies any other aggravating or relieving factors. ? ? ? ?Past Medical History:  ?Diagnosis Date  ? Anemia   ? Asthma   ? Enlarged heart   ? H/O candidiasis   ? H/O varicella   ? Irregular periods/menstrual cycles 07/01/09  ? Migraines   ? Morbid obesity (HCC) 09/17/2015  ? Seizure-like activity (HCC)   ? Seizures (HCC)   ? ? ?Patient Active Problem List  ? Diagnosis Date Noted  ? Partial symptomatic epilepsy with complex partial seizures, not intractable, without status epilepticus (HCC) 04/11/2019  ? Morbid obesity (HCC) 09/17/2015  ? Normal labor 02/23/2014  ? Lactating mother 11/27/2011  ? Teen pregnancy 11/27/2011  ? Pregnant state, incidental 11/25/2011  ? Vaginal delivery 11/25/2011  ? Perineal laceration complicating delivery 11/25/2011  ? Candida albicans infection 11/15/2011  ? Excess weight gain in pregnancy 11/07/2011  ? Anemia in pregnancy 09/14/2011  ? History of high blood pressure - never took meds 04/12/2011  ? Latex allergy 04/12/2011  ? Shellfish allergy 04/12/2011  ? History of anemia 11/26/2010  ? Asthma 11/26/2010  ? Migraine 11/26/2010  ? ? ?Past Surgical History:  ?Procedure Laterality Date  ? CHOLECYSTECTOMY  03/20/2012  ? Procedure: LAPAROSCOPIC CHOLECYSTECTOMY;  Surgeon: Shelly Rubensteinouglas A Blackman, MD;  Location: [facility];  Service:  General;  Laterality: N/A;  ? ? ?OB History   ? ? Gravida  ?2  ? Para  ?2  ? Term  ?2  ? Preterm  ?0  ? AB  ?0  ? Living  ?2  ?  ? ? SAB  ?0  ? IAB  ?0  ? Ectopic  ?0  ? Multiple  ?0  ? Live Births  ?2  ?   ?  ?  ? ? ? ?Home Medications   ? ?Prior to Admission medications   ?Medication Sig Start Date End Date Taking? Authorizing Provider  ?JUNEL FE 1/20 1-20 MG-MCG tablet Take 1 tablet by mouth once daily 06/14/21   Allwardt, Alyssa M, PA-C  ?lamoTRIgine (LAMICTAL) 25 MG tablet 1 tablet twice a day for the first week ?2 tablets twice a day for the second week ?3 tablets twice a day for the third week ?4 tablets twice a day for the fourth week ? ?For total of 140 tablets ? ?After finish titration with small dose of lamotrigine 25 mg, change to lamotrigine 100 mg twice a day 05/11/21   Glean SalvoSlack, Sarah J, NP  ?MAGNESIUM PO Take 30 mg by mouth daily.    [provider]  ?meloxicam (MOBIC) 15 MG tablet Take 1 tablet (15 mg total) by mouth daily. 08/27/21   Richardean SaleJackson, Benjamin, DO  ?Multiple Vitamin (MULTIVITAMIN) tablet Take 1 tablet by mouth daily.  [provider]  ? ? ?Family History ?Family History  ?Problem Relation Age of Onset  ? Hypertension Mother   ? Cancer Maternal Grandmother   ?     breast  ? Cancer Maternal Grandfather   ?     colon  ? Seizures Father   ? Sleep apnea Father   ? Seizures Paternal Aunt   ?     x 3 aunts  ? Anesthesia problems Neg Hx   ? ? ?Social History ?Social History  ? ?Tobacco Use  ? Smoking status: Never  ? Smokeless tobacco: Never  ?Vaping Use  ? Vaping Use: Former  ?Substance Use Topics  ? Alcohol use: Never  ? Drug use: Yes  ?  Types: Marijuana  ? ? ? ?Allergies   ?Latex, Penicillins, Tomato, Iodine, and Shellfish allergy ? ? ?Review of Systems ?Review of Systems ?Per HPI ? ?Physical Exam ?Triage Vital Signs ?ED Triage Vitals  ?Enc Vitals Group  ?   BP 09/19/21 1731 127/77  ?   Pulse Rate 09/19/21 1731 62  ?   Resp 09/19/21 1731 16  ?   Temp 09/19/21 1731 98.3 ?F (36.8 ?C)   ?   Temp Source 09/19/21 1731 Oral  ?   SpO2 09/19/21 1731 98 %  ?   Weight 09/19/21 1730 242 lb (109.8 kg)  ?   Height 09/19/21 1730 5\' 5"  (1.651 m)  ?   Head Circumference --   ?   Peak Flow --   ?   Pain Score 09/19/21 1730 0  ?   Pain Loc --   ?   Pain Edu? --   ?   Excl. in GC? --   ? ?No data found. ? ?Updated Vital Signs ?BP 127/77 (BP Location: Right Arm)   Pulse 62   Temp 98.3 ?F (36.8 ?C) (Oral)   Resp 16   Ht 5\' 5"  (1.651 m)   Wt 242 lb (109.8 kg)   SpO2 98%   BMI 40.27 kg/m?  ? ?Visual Acuity ?Right Eye Distance:   ?Left Eye Distance:   ?Bilateral Distance:   ? ?Right Eye Near:   ?Left Eye Near:    ?Bilateral Near:    ? ?Physical Exam ?Vitals and nursing note reviewed.  ?Constitutional:   ?   General: She is not in acute distress. ?   Appearance: Normal appearance. She is well-developed. She is not ill-appearing.  ?HENT:  ?   Head: Normocephalic and atraumatic.  ?   Jaw: There is normal jaw occlusion.  ?   Right Ear: Hearing, tympanic membrane, ear canal and external ear normal.  ?   Left Ear: Hearing, tympanic membrane, ear canal and external ear normal.  ?   Nose: Nose normal.  ?   Mouth/Throat:  ?   Lips: Pink.  ?   Mouth: Mucous membranes are moist.  ?   Dentition: Normal dentition.  ?   Tongue: No lesions. Tongue does not deviate from midline.  ?   Pharynx: Oropharynx is clear. Uvula midline.  ?   Tonsils: 1+ on the right. 1+ on the left.  ?   Comments: Mild erythema to posterior oropharynx. 2-3 tonsil stones to mildly swollen tonsils bilaterally. Airway intact.  ?Eyes:  ?   Extraocular Movements: Extraocular movements intact.  ?   Conjunctiva/sclera: Conjunctivae normal.  ?Cardiovascular:  ?   Rate and Rhythm: Normal rate and regular rhythm.  ?   Heart sounds: Normal heart sounds. No murmur heard. ?  No friction rub. No gallop.  ?Pulmonary:  ?   Effort: Pulmonary effort is normal. No respiratory distress.  ?   Breath sounds: Normal breath sounds. No wheezing, rhonchi or rales.  ?Chest:  ?    Chest wall: No tenderness.  ?Abdominal:  ?   Palpations: Abdomen is soft.  ?   Tenderness: There is no abdominal tenderness. There is no right CVA tenderness or left CVA tenderness.  ?Musculoskeletal:     ?   General: No swelling.  ?   Cervical back: Neck supple.  ?Lymphadenopathy:  ?   Cervical: No cervical adenopathy.  ?Skin: ?   General: Skin is warm and dry.  ?   Capillary Refill: Capillary refill takes less than 2 seconds.  ?   Findings: No rash.  ?Neurological:  ?   General: No focal deficit present.  ?   Mental Status: She is alert and oriented to person, place, and time.  ?Psychiatric:     ?   Mood and Affect: Mood normal.     ?   Behavior: Behavior normal.     ?   Thought Content: Thought content normal.     ?   Judgment: Judgment normal.  ? ? ? ?UC Treatments / Results  ?Labs ?(all labs ordered are listed, but only abnormal results are displayed) ?Labs Reviewed  ?CULTURE, GROUP A STREP [facility])  ?POCT RAPID STREP A, ED / UC  ? ? ?EKG ? ? ?Radiology ?No results found. ? ?Procedures ?Procedures (including critical care time) ? ?Medications Ordered in UC ?Medications - No data to display ? ?Initial Impression / Assessment and Plan / UC Course  ?I have reviewed the triage vital signs and the nursing notes. ? ?Pertinent labs & imaging results that were available during my care of the patient were reviewed by me and considered in my medical decision making (see chart for details). ? ? Patient is a 31 year old female presenting to urgent care with tonsil stones today. She is asymptomatic, afebrile, and regularly sees a dentist. She has normal dentition to physical exam. Group A strep negative in clinic today. Throat culture pending. Will treat for any bacteria based on throat culture results in the next couple of days.  ? ?Patient instructed to perform salt water gargles to take care of tonsil stones. Tonsil stones may go away on their own, but she is to call her dentist to make an appointment for tonsil stone  removal if they become larger. Tylenol/ibuprofen every 6 hours for any pain she may develop.  ? ?Counseled patient regarding appropriate use of medications and potential side effects for all medications recomme

## 2021-09-19 NOTE — Discharge Instructions (Addendum)
Perform salt water and baking soda gargles by placing 1 teaspoon of salt and 1/2 teaspoon of baking soda in 8 ounces of warm water and gargling the water then spitting it out.  You may also gargle mouthwash antiseptic and spit it out. ? ?Your strep test was negative in the clinic today.  Your throat culture is pending.  We will give you a call if your throat culture grows any bacteria. ? ?If you develop any new or worsening symptoms or do not improve in the next 2 to 3 days, please return.  If your symptoms are severe, please go to the emergency room.  Follow-up with your primary care provider for further evaluation and management of your symptoms as well as ongoing wellness visits.  I hope you feel better! ?

## 2021-09-19 NOTE — ED Triage Notes (Signed)
Pt reports noticing "white patches in the throat" 3 days ago. States patches starting on the left side and is now affecting the right side. Denies having a sore throat, fever, nasal congestion.  ?

## 2021-09-22 LAB — CULTURE, GROUP A STREP (THRC)

## 2022-01-31 ENCOUNTER — Encounter: Payer: Self-pay | Admitting: *Deleted

## 2022-04-21 ENCOUNTER — Encounter: Payer: Self-pay | Admitting: *Deleted

## 2022-06-09 ENCOUNTER — Other Ambulatory Visit: Payer: Self-pay

## 2022-06-09 ENCOUNTER — Ambulatory Visit (HOSPITAL_COMMUNITY)
Admission: EM | Admit: 2022-06-09 | Discharge: 2022-06-09 | Disposition: A | Discharge status: Home / Self Care | Payer: Self-pay | Attending: Sports Medicine | Admitting: Sports Medicine

## 2022-06-09 ENCOUNTER — Encounter (HOSPITAL_COMMUNITY): Payer: Self-pay | Admitting: Emergency Medicine

## 2022-06-09 DIAGNOSIS — Z3202 Encounter for pregnancy test, result negative: Secondary | ICD-10-CM | POA: Diagnosis not present

## 2022-06-09 DIAGNOSIS — Z113 Encounter for screening for infections with a predominantly sexual mode of transmission: Secondary | ICD-10-CM | POA: Insufficient documentation

## 2022-06-09 DIAGNOSIS — Z202 Contact with and (suspected) exposure to infections with a predominantly sexual mode of transmission: Secondary | ICD-10-CM | POA: Diagnosis not present

## 2022-06-09 LAB — POC URINE PREG, ED: Preg Test, Ur: NEGATIVE

## 2022-06-09 NOTE — Discharge Instructions (Addendum)
Your pregnancy test was negative today.  We have collected a swab for sexually transmitted infections.  Your results will be available on MyChart if there are any abnormalities urgent care staff will call you with those results and appropriate treatment.  Follow-up with your primary care provider to discuss further blood testing if you wish.  Also recommend regular condom use and abstaining from sexual intercourse until your results have come back.

## 2022-06-09 NOTE — ED Triage Notes (Signed)
Patient denies std symptoms.  Admits to having unprotected sex and wants to make sure.  Lmp 05/14/2022, patient is on junel birth control.

## 2022-06-09 NOTE — ED Provider Notes (Signed)
[place]    CSN: [id number] Arrival date & time: 06/09/22  1015      History   Chief Complaint No chief complaint on file.   HPI Karen Simon is a 32 y.o. female.   Karen Simon is here today requesting STD pregnancy testing.  She reports her last menstrual period was 05/14/21 and she reports having unprotected sexual intercourse.  She is currently taking oral contraception pills but is wanting to make sure that she is not pregnant.  She denies any abdominal pain, vaginal discharge, vaginal odor, cramping or vaginal bleeding.     Past Medical History:  Diagnosis Date   Anemia    Asthma    Enlarged heart    H/O candidiasis    H/O varicella    Irregular periods/menstrual cycles 07/01/09   Migraines    Morbid obesity ([place]) 09/17/2015   Seizure-like activity ([place])    Seizures ([place])     Patient Active Problem List   Diagnosis Date Noted   Partial symptomatic epilepsy with complex partial seizures, not intractable, without status epilepticus ([place]) 04/11/2019   Morbid obesity ([place]) 09/17/2015   Normal labor 02/23/2014   Lactating mother 11/27/2011   Teen pregnancy 11/27/2011   Pregnant state, incidental 11/25/2011   Vaginal delivery 11/25/2011   Perineal laceration complicating delivery 91/47/8295   Candida albicans infection 11/15/2011   Excess weight gain in pregnancy 11/07/2011   Anemia in pregnancy 09/14/2011   History of high blood pressure - never took meds 04/12/2011   Latex allergy 04/12/2011   Shellfish allergy 04/12/2011   History of anemia 11/26/2010   Asthma 11/26/2010   Migraine 11/26/2010    Past Surgical History:  Procedure Laterality Date   CHOLECYSTECTOMY  03/20/2012   Procedure: LAPAROSCOPIC CHOLECYSTECTOMY;  Surgeon: Harl Bowie, MD;  Location: [facility];  Service: General;  Laterality: N/A;    OB History     Gravida  2   Para  2   Term  2   Preterm  0   AB  0   Living  2      SAB  0   IAB  0   Ectopic  0    Multiple  0   Live Births  2            Home Medications    Prior to Admission medications   Medication Sig Start Date End Date Taking? Authorizing Provider  JUNEL FE 1/20 1-20 MG-MCG tablet Take 1 tablet by mouth once daily 06/14/21   Allwardt, Alyssa M, PA-C  lamoTRIgine (LAMICTAL) 25 MG tablet 1 tablet twice a day for the first week 2 tablets twice a day for the second week 3 tablets twice a day for the third week 4 tablets twice a day for the fourth week  For total of 140 tablets  After finish titration with small dose of lamotrigine 25 mg, change to lamotrigine 100 mg twice a day 05/11/21   Suzzanne Cloud, NP  MAGNESIUM PO Take 30 mg by mouth daily.    [provider]  meloxicam (MOBIC) 15 MG tablet Take 1 tablet (15 mg total) by mouth daily. 08/27/21   Glennon Mac, DO  Multiple Vitamin (MULTIVITAMIN) tablet Take 1 tablet by mouth daily.    [provider]    Family History Family History  Problem Relation Age of Onset   Hypertension Mother    Cancer Maternal Grandmother        breast   Cancer Maternal  Grandfather        colon   Seizures Father    Sleep apnea Father    Seizures Paternal Aunt        x 3 aunts   Anesthesia problems Neg Hx     Social History Social History   Tobacco Use   Smoking status: Never   Smokeless tobacco: Never  Vaping Use   Vaping Use: Former  Substance Use Topics   Alcohol use: Never   Drug use: Yes    Types: Marijuana     Allergies   Latex, Penicillins, Tomato, Iodine, and Shellfish allergy   Review of Systems Review of Systems   Physical Exam Triage Vital Signs ED Triage Vitals  Enc Vitals Group     BP      Pulse      Resp      Temp      Temp src      SpO2      Weight      Height      Head Circumference      Peak Flow      Pain Score      Pain Loc      Pain Edu?      Excl. in [place]?    No data found.  Updated Vital Signs There were no vitals taken for this visit.  Physical  Exam Vitals reviewed.  Constitutional:      General: She is not in acute distress.    Appearance: Normal appearance. She is not ill-appearing, toxic-appearing or diaphoretic.  HENT:     Mouth/Throat:     Mouth: Mucous membranes are moist.  Eyes:     Conjunctiva/sclera: Conjunctivae normal.  Cardiovascular:     Rate and Rhythm: Normal rate.  Pulmonary:     Effort: Pulmonary effort is normal.  Neurological:     General: No focal deficit present.     Mental Status: She is alert.     Gait: Gait normal.  Psychiatric:        Mood and Affect: Mood normal.        Behavior: Behavior normal.        Thought Content: Thought content normal.        Judgment: Judgment normal.      UC Treatments / Results  Labs (all labs ordered are listed, but only abnormal results are displayed) Labs Reviewed  POC URINE PREG, ED  CERVICOVAGINAL ANCILLARY ONLY    EKG   Radiology No results found.  Procedures Procedures (including critical care time)  Medications Ordered in UC Medications - No data to display  Initial Impression / Assessment and Plan / UC Course  I have reviewed the triage vital signs and the nursing notes.  Pertinent labs & imaging results that were available during my care of the patient were reviewed by me and considered in my medical decision making (see chart for details).     Patient is here today requesting STI testing and pregnancy test after unprotected sex with a female partner.  UPT collected today was negative.  Vaginal swab collected.  I offered her blood testing for HIV, syphilis and hepatitis but she declined at this time.  If patient does have any abnormal results urgent care staff will call her with appropriate treatment.  I recommend she abstain from sexual intercourse until her results have come back.  She can also see her results on MyChart.  I encouraged her to follow-up with her primary care provider  and to discuss blood testing further at that time.  She  verbalized understanding. Final Clinical Impressions(s) / UC Diagnoses   Final diagnoses:  None   Discharge Instructions   None    ED Prescriptions   None    PDMP not reviewed this encounter.   Elmore Guise, DO 06/09/22 1326

## 2022-06-10 LAB — CERVICOVAGINAL ANCILLARY ONLY
Bacterial Vaginitis (gardnerella): POSITIVE — AB
Candida Glabrata: POSITIVE — AB
Candida Vaginitis: NEGATIVE
Chlamydia: NEGATIVE
Comment: NEGATIVE
Comment: NEGATIVE
Comment: NEGATIVE
Comment: NEGATIVE
Comment: NEGATIVE
Comment: NORMAL
Neisseria Gonorrhea: NEGATIVE
Trichomonas: NEGATIVE

## 2022-06-13 ENCOUNTER — Telehealth (HOSPITAL_COMMUNITY): Payer: Self-pay | Admitting: Emergency Medicine

## 2022-06-13 ENCOUNTER — Encounter: Payer: Managed Care, Other (non HMO) | Admitting: Physician Assistant

## 2022-06-13 MED ORDER — METRONIDAZOLE 500 MG PO TABS: 500.0000 mg | ORAL_TABLET | Freq: Two times a day (BID) | ORAL | 0 refills | Status: DC

## 2022-06-13 MED ORDER — FLUCONAZOLE 150 MG PO TABS: 150.0000 mg | ORAL_TABLET | Freq: Once | ORAL | 0 refills | Status: AC

## 2022-07-26 ENCOUNTER — Ambulatory Visit: Payer: Medicaid Other | Admitting: Physician Assistant

## 2022-08-08 ENCOUNTER — Ambulatory Visit: Payer: Medicaid Other | Admitting: Physician Assistant

## 2022-08-19 ENCOUNTER — Encounter: Payer: Self-pay | Admitting: Physician Assistant

## 2022-08-19 ENCOUNTER — Ambulatory Visit (INDEPENDENT_AMBULATORY_CARE_PROVIDER_SITE_OTHER): Payer: Self-pay | Admitting: Physician Assistant

## 2022-08-19 ENCOUNTER — Ambulatory Visit: Payer: Medicaid Other | Admitting: Family Medicine

## 2022-08-19 VITALS — BP 130/76 | HR 65 | Temp 98.0°F | Ht 65.0 in | Wt 240.2 lb

## 2022-08-19 DIAGNOSIS — R61 Generalized hyperhidrosis: Secondary | ICD-10-CM

## 2022-08-19 DIAGNOSIS — N926 Irregular menstruation, unspecified: Secondary | ICD-10-CM

## 2022-08-19 LAB — COMPREHENSIVE METABOLIC PANEL
ALT: 11 U/L (ref 0–35)
AST: 10 U/L (ref 0–37)
Albumin: 3.9 g/dL (ref 3.5–5.2)
Alkaline Phosphatase: 53 U/L (ref 39–117)
BUN: 12 mg/dL (ref 6–23)
CO2: 27 mEq/L (ref 19–32)
Calcium: 9.1 mg/dL (ref 8.4–10.5)
Chloride: 104 mEq/L (ref 96–112)
Creatinine, Ser: 0.7 mg/dL (ref 0.40–1.20)
GFR: 116.85 mL/min (ref >60.00)
Glucose, Bld: 77 mg/dL (ref 70–99)
Potassium: 4.1 mEq/L (ref 3.5–5.1)
Sodium: 138 mEq/L (ref 135–145)
Total Bilirubin: 0.3 mg/dL (ref 0.2–1.2)
Total Protein: 7 g/dL (ref 6.0–8.3)

## 2022-08-19 LAB — TSH: TSH: 0.67 u[IU]/mL (ref 0.35–5.50)

## 2022-08-19 LAB — CBC WITH DIFFERENTIAL/PLATELET
Basophils Absolute: 0 10*3/uL (ref 0.0–0.1)
Basophils Relative: 0.7 % (ref 0.0–3.0)
Eosinophils Absolute: 0.2 10*3/uL (ref 0.0–0.7)
Eosinophils Relative: 3 % (ref 0.0–5.0)
HCT: 34.8 % — ABNORMAL LOW (ref 36.0–46.0)
Hemoglobin: 11.5 g/dL — ABNORMAL LOW (ref 12.0–15.0)
Lymphocytes Relative: 31.9 % (ref 12.0–46.0)
Lymphs Abs: 2.2 10*3/uL (ref 0.7–4.0)
MCHC: 33 g/dL (ref 30.0–36.0)
MCV: 86.3 fl (ref 78.0–100.0)
Monocytes Absolute: 0.4 10*3/uL (ref 0.1–1.0)
Monocytes Relative: 5.6 % (ref 3.0–12.0)
Neutro Abs: 4.1 10*3/uL (ref 1.4–7.7)
Neutrophils Relative %: 58.8 % (ref 43.0–77.0)
Platelets: 253 10*3/uL (ref 150.0–400.0)
RBC: 4.04 Mil/uL (ref 3.87–5.11)
RDW: 13.3 % (ref 11.5–15.5)
WBC: 7 10*3/uL (ref 4.0–10.5)

## 2022-08-19 LAB — POCT URINE PREGNANCY: Preg Test, Ur: NEGATIVE

## 2022-08-19 NOTE — Patient Instructions (Addendum)
Good to see you today!  Pelvic exam looks normal.  Will check labs today, treat pending any abnormal results.  Consider pelvic US / pap smear / genetic referral once insurance established next month.  Consider treatment for BV as this was not previously completed.

## 2022-08-19 NOTE — Progress Notes (Unsigned)
Subjective:    Patient ID: Karen Simon, female    DOB: 11-04-1990, 32 y.o.   MRN: [id number]  Chief Complaint  Patient presents with   Menstrual Problem    Pt c/o menstrual issues/clotting, severe night sweats; pt has been having the night sweats for past 4 months; started back on birth control but menstruals were inconsistent, now for the past week been having blood clotting, pt ha some period cramping but nothing out of the normal or intolerable.     HPI Patient is in today for concerns of night sweats / menstrual issues with clotting.   LMP was first week in February - lasted for longer than normal, resolved; no bleeding in March; Now just spotting symptoms for last week and a half. Regular cramping, but not pain.  Started back on [place] OCP in January. Not missing any doses.   No fevers or dizziness. No edema. No CP or SOB.   Says night sweats started late last year prior to being on OCP. Only at night. Covered in sweat, changing sheets; has a fan going. Not drinking much caffeine.  Feels tired, but not sure if just from work. No wt changes.   Difficulty getting menstrual cup in with last cycle. Unsure of last pap, 2015 documented - normal.   Dx yeast & BV - Feb 2024, didn't take treatments.  Last sexual activity in Feb, none since then.   Mom dx with ovarian cancer, just had full hysterectomy, now going thru radiation. MGM - breast and ovarian ca hx.   Will have insurance in the next month per pt.  Past Medical History:  Diagnosis Date   Anemia    Asthma    Enlarged heart    H/O candidiasis    H/O varicella    Irregular periods/menstrual cycles 07/01/09   Migraines    Morbid obesity 09/17/2015   Seizure-like activity    Seizures     Past Surgical History:  Procedure Laterality Date   CHOLECYSTECTOMY  03/20/2012   Procedure: LAPAROSCOPIC CHOLECYSTECTOMY;  Surgeon: Shelly Rubenstein, MD;  Location: [facility];  Service: General;  Laterality: N/A;     Family History  Problem Relation Age of Onset   Hypertension Mother    Ovarian cancer Mother    Seizures Father    Sleep apnea Father    Cancer Maternal Grandmother        breast   Cancer Maternal Grandfather        colon   Seizures Paternal Aunt        x 3 aunts   Anesthesia problems Neg Hx     Social History   Tobacco Use   Smoking status: Never   Smokeless tobacco: Never  Vaping Use   Vaping Use: Every day  Substance Use Topics   Alcohol use: Yes   Drug use: Yes    Types: Marijuana     Allergies  Allergen Reactions   Latex Hives   Penicillins Anaphylaxis, Itching and Swelling   Tomato Hives and Swelling   Iodine Hives   Shellfish Allergy Hives    Review of Systems NEGATIVE UNLESS OTHERWISE INDICATED IN HPI      Objective:     BP 130/76 (BP Location: Left Arm)   Pulse 65   Temp 98 F (36.7 C) (Temporal)   Ht  (1.651 m)   Wt 240 lb 3.2 oz (109 kg)   LMP 06/17/2022 (Approximate)   SpO2 99%   BMI 39.97  kg/m   Wt Readings from Last 3 Encounters:  08/19/22 240 lb 3.2 oz (109 kg)  09/19/21 242 lb (109.8 kg)  09/17/21 242 lb (109.8 kg)    BP Readings from Last 3 Encounters:  08/19/22 130/76  09/19/21 127/77  09/17/21 118/80     Physical Exam Vitals and nursing note reviewed. Exam conducted with a chaperone present.  Constitutional:      Appearance: Normal appearance. She is obese.  Cardiovascular:     Rate and Rhythm: Normal rate and regular rhythm.     Pulses: Normal pulses.     Heart sounds: Normal heart sounds. No murmur heard. Pulmonary:     Effort: Pulmonary effort is normal.     Breath sounds: Normal breath sounds.  Abdominal:     General: Abdomen is flat. Bowel sounds are normal.     Palpations: Abdomen is soft.  Genitourinary:    General: Normal vulva.     Labia:        Right: No rash.        Left: No rash.      Vagina: Normal.     Cervix: Normal.     Uterus: Normal.      Adnexa: Right adnexa normal and left  adnexa normal.  Neurological:     General: No focal deficit present.     Mental Status: She is alert and oriented to person, place, and time.  Psychiatric:     Comments: Tearful talking about mom's new diagnosis        Assessment & Plan:  Menstrual irregularity -     POCT urine pregnancy -     CBC with Differential/Platelet -     Comprehensive metabolic panel -     TSH  Night sweats -     CBC with Differential/Platelet -     Comprehensive metabolic panel -     TSH   Reassured - pelvic exam looks normal.  Will check labs today, treat pending any abnormal results.  Consider pelvic US / pap smear / genetic referral once insurance established next month.  Consider treatment for BV as this was not previously completed.     Return if symptoms worsen or fail to improve.  This note was prepared with assistance of Conservation officer, historic buildings. Occasional wrong-word or sound-a-like substitutions may have occurred due to the inherent limitations of voice recognition software.  Time Spent: 32 minutes of total time was spent on the date of the encounter performing the following actions: chart review prior to seeing the patient, obtaining history, performing a medically necessary exam, counseling on the treatment plan, placing orders, and documenting in our EHR.       Drayden Lukas M Jameal Razzano, PA-C

## 2022-09-05 ENCOUNTER — Ambulatory Visit: Payer: Medicaid Other | Admitting: Physician Assistant

## 2022-09-10 ENCOUNTER — Ambulatory Visit: Payer: Self-pay

## 2022-09-19 ENCOUNTER — Inpatient Hospital Stay: Payer: Medicaid Other | Attending: Nurse Practitioner | Admitting: Licensed Clinical Social Worker

## 2022-09-19 ENCOUNTER — Encounter: Payer: Self-pay | Admitting: Licensed Clinical Social Worker

## 2022-09-19 ENCOUNTER — Inpatient Hospital Stay: Payer: Medicaid Other

## 2022-09-19 ENCOUNTER — Other Ambulatory Visit: Payer: Self-pay

## 2022-09-19 DIAGNOSIS — Z8049 Family history of malignant neoplasm of other genital organs: Secondary | ICD-10-CM

## 2022-09-19 DIAGNOSIS — Z800 Family history of malignant neoplasm of digestive organs: Secondary | ICD-10-CM

## 2022-09-19 DIAGNOSIS — Z803 Family history of malignant neoplasm of breast: Secondary | ICD-10-CM

## 2022-09-19 LAB — GENETIC SCREENING ORDER

## 2022-09-19 NOTE — Progress Notes (Signed)
REFERRING PROVIDER: Self-referred  PRIMARY PROVIDER:  Allwardt, Alyssa M, PA-C  PRIMARY REASON FOR VISIT:  1. Family history of Lynch syndrome   2. Family history of uterine cancer   3. Family history of breast cancer   4. Family history of colon cancer      HISTORY OF PRESENT ILLNESS:   Ms. Karen Simon, a 32 y.o. female, was seen for a [facility] cancer genetics consultation due to her mother's recent genetic testing that showed an MSH2 mutation (Lynch syndrome).  Karen Simon presents to clinic today to discuss the possibility of a hereditary predisposition to cancer, genetic testing, and to further clarify her future cancer risks, as well as potential cancer risks for family members.   CANCER HISTORY:  Karen Simon is a 32 y.o. female with no personal history of cancer.    RISK FACTORS:  Menarche was at age 89.  First live birth at age 89.  Ovaries intact: yes.  Hysterectomy: no.  Menopausal status: pt states she may be going through early menopause.  HRT use: 0 years. Colonoscopy: yes;  at 24; normal . Mammogram within the last year: no. Number of breast biopsies: 0. Up to date with pelvic exams: no.   Past Medical History:  Diagnosis Date   Anemia    Asthma    Enlarged heart    H/O candidiasis    H/O varicella    Irregular periods/menstrual cycles 07/01/09   Migraines    Morbid obesity ([facility]) 09/17/2015   Seizure-like activity ([facility])    Seizures ([facility])     Past Surgical History:  Procedure Laterality Date   CHOLECYSTECTOMY  03/20/2012   Procedure: LAPAROSCOPIC CHOLECYSTECTOMY;  Surgeon: Shelly Rubenstein, MD;  Location: [facility];  Service: General;  Laterality: N/A;    FAMILY HISTORY:  We obtained a detailed, 4-generation family history.  Significant diagnoses are listed below: Family History  Problem Relation Age of Onset   Hypertension Mother    Endometrial cancer Mother 37       MSH2+ Lynch syndrome   Seizures Father    Sleep apnea Father    Other  Sister        MSH2+   Seizures Paternal Aunt        x 3 aunts   Breast cancer Maternal Grandmother 35   Colon cancer Maternal Grandfather 34   Prostate cancer Maternal Grandfather    Breast cancer Cousin 60   Anesthesia problems Neg Hx    Karen Simon has 1 son, 59 and 1 daughter, 8. She has 1 sister, 57, who tested positive for MSH2 mutation.   Karen Simon's mother was diagnosed with endometrial cancer at 43 and had genetic testing that showed an MSH2 mutation (Lynch syndrome). Maternal grandfather had colon cancer in his 96s and died at 78, he also had prostate cancer. Maternal grandmother had breast cancer at 31 and is living at 84. A maternal first cousin once removed had breast cancer at 29.   Karen Simon does not report any cancers on her paternal side.  Karen Simon is aware of previous family history of genetic testing for hereditary cancer risks. There is no reported Ashkenazi Jewish ancestry. There is no known consanguinity.    GENETIC COUNSELING ASSESSMENT: Ms. [name] is a 32 y.o. female with a family history of MSH2 gene mutation (Lynch syndrome). We, therefore, discussed and recommended the following at today's visit.   DISCUSSION: We discussed that approximately 10% of cancer is hereditary. We discussed the MSH2 gene in  detail, noting cancer risks associated and potential management changes. She has a 50% chance to have inherited this from her mother. We discussed that testing is beneficial for several reasons including knowing about cancer risks, identifying potential screening and risk-reduction options that may be appropriate, and to understand if other family members could be at risk for cancer and allow them to undergo genetic testing.   We reviewed the characteristics, features and inheritance patterns of hereditary cancer syndromes. We also discussed genetic testing, including the appropriate family members to test, the process of testing, insurance coverage and  turn-around-time for results. We discussed the implications of a negative, positive and/or variant of uncertain significant result. We recommended Karen Simon pursue genetic testing for the [facility] family variant test for the MSH2 mutation found in her mother.   Based on Karen Simon's family history of cancer, she meets medical criteria for genetic testing. Her testing will be no cost due to [facility]'s family variant testing program.  PLAN: After considering the risks, benefits, and limitations, Karen Simon provided informed consent to pursue genetic testing and the blood sample was sent to [facility] for analysis of the MSH2 gene. Results should be available within approximately 2-3 weeks' time, at which point they will be disclosed by telephone to Ms. Mike, as will any additional recommendations warranted by these results. Karen Simon will receive a summary of her genetic counseling visit and a copy of her results once available. This information will also be available in Epic.   Karen Simon's questions were answered to her satisfaction today. Our contact information was provided should additional questions or concerns arise. Thank you for the referral and allowing [place] to share in the care of your patient.   Lacy Duverney, MS, [facility] Genetic Counselor [email or web address] Phone: [phone]  The patient was seen for a total of 20 minutes in face-to-face genetic counseling.  Dr. Orlie Dakin was available for discussion regarding this case.   _______________________________________________________________________ For Office Staff:  Number of people involved in session: 1 Was an Intern/ student involved with case: no

## 2022-10-05 ENCOUNTER — Telehealth: Payer: Self-pay | Admitting: Licensed Clinical Social Worker

## 2022-10-05 ENCOUNTER — Ambulatory Visit: Payer: Self-pay | Admitting: Licensed Clinical Social Worker

## 2022-10-05 ENCOUNTER — Encounter: Payer: Self-pay | Admitting: Licensed Clinical Social Worker

## 2022-10-05 DIAGNOSIS — Z1379 Encounter for other screening for genetic and chromosomal anomalies: Secondary | ICD-10-CM

## 2022-10-05 DIAGNOSIS — Z1509 Genetic susceptibility to other malignant neoplasm: Secondary | ICD-10-CM | POA: Insufficient documentation

## 2022-10-05 NOTE — Telephone Encounter (Signed)
I contacted Ms. Tapia to discuss her genetic testing results. 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 Detailed clinic note to follow.   The test report has been scanned into EPIC and is located under the Molecular Pathology section of the Results Review tab.  A portion of the result report is included below for reference.      Lacy Duverney, MS, [facility] Genetic Counselor [email or web address] Phone: [phone]

## 2022-10-05 NOTE — Progress Notes (Signed)
Genetic Test Results  HPI:   Karen Simon was previously seen in the [facility] due to a family history of cancer and her mother's testing that showed an MSH2 mutation, and concerns regarding a hereditary predisposition to cancer. Please refer to our prior cancer genetics clinic note for more information regarding our discussion, assessment and recommendations, at the time. Karen Simon's recent genetic test results were disclosed to her, as were recommendations warranted by these results. These results and recommendations are discussed in more detail below.  CANCER HISTORY:  Oncology History   No history exists.    FAMILY HISTORY:  We obtained a detailed, 4-generation family history.  Significant diagnoses are listed below: Family History  Problem Relation Age of Onset   Hypertension Mother    Endometrial cancer Mother 88       MSH2+ Lynch syndrome   Seizures Father    Sleep apnea Father    Other Sister        MSH2+   Seizures Paternal Aunt        x 3 aunts   Breast cancer Maternal Grandmother 35   Colon cancer Maternal Grandfather 34   Prostate cancer Maternal Grandfather    Breast cancer Cousin 13   Anesthesia problems Neg Hx     Karen Simon has 1 son, 15 and 1 daughter, 8. She has 1 sister, 74, who tested positive for MSH2 mutation.    Ms. Comer's mother was diagnosed with endometrial cancer at 61 and had genetic testing that showed an MSH2 mutation (Lynch syndrome). Maternal grandfather had colon cancer in his 34s and died at 55, he also had prostate cancer. Maternal grandmother had breast cancer at 81 and is living at 68. A maternal first cousin once removed had breast cancer at 37.    Karen Simon does not report any cancers on her paternal side.   Karen Simon is aware of previous family history of genetic testing for hereditary cancer risks. There is no reported Ashkenazi Jewish ancestry. There is no known consanguinity.    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  A copy of this result is scanned under the Epic lab tab.       Clinical Information Pathogenic variants in the MSH2 gene are associated with Lynch syndrome.   We discussed the implications of Lynch syndrome for Karen Simon and discussed who else in the family should have genetic testing. Individuals with Lynch syndrome have an increased risk for colon cancer and endometrial cancer, as well as other types of cancers. The cancer risks associated with the MSH2 gene are currently known to include: Colon cancer: 33-52% Endometrial cancer: 21-57% Ovarian cancer: 8-38% Renal pelvis and/or ureter: 2.2-28% Bladder: 4.4-12.8% Gastric: 0.2-9.0% Small bowel: 1.1-10% Pancreas: 0.5.-1.6% Biliary tract: 0.02-1.7% Prostate: 3.9-23.8% Brain: 2.5-7.7%   Additional screening and risk-reduction options have been identified to be appropriate for individuals with Lynch syndrome to manage these risks.  We recommended Karen Simon consider following management guidelines for Lynch syndrome; all of which are outlined below. These can be coordinated by Karen Simon primary care provider.     Management Recommendations:   Colorectal Cancer Screening: High quality colonoscopy at age 1-35 or 2-5 years prior to the earliest colon cancer if it is diagnosed before age 37 and repeat every 1-2 years Consider using daily aspirin to reduce the risk of colorectal cancer. The  decision to use aspirin should be made on an individual basis, including discussion of individual risks, benefits, adverse effects, and childbearing plans.   Endometrial Cancer Screening/Risk Reduction: Women should report any abnormal uterine bleeding or postmenopausal bleeding. The evaluation of these symptoms should include an endometrial biopsy. A hysterectomy may be considered.  The timing should be individualized based on whether childbearing is complete, comorbidities, and family history. Endometrial cancer screening does not have a proven benefit in women with Lynch Syndrome. However, endometrial biopsy is highly sensitive and specific as a diagnostic procedure. Screening via endometrial biopsy every 1-2 years starting at age 84-35 can be considered. Transvaginal ultrasounds may be considered in postmenopausal women at their clinician's discretion.   Ovarian Cancer Screening/Risk Reduction: A prophylactic bilateral salpingo-oophorectomy (BSO), or having the ovaries and fallopian tubes removed, may be considered. A BSO is estimated to reduce the risk of ovarian cancer by up to 96%. Timing of a BSO should be individualized based on whether childbearing is complete, menopause status, comorbidities, and family history. Women should be aware of symptoms that might be associated with the development of ovarian cancer including pelvic or abdominal pain, bloating, increased abdominal girth, difficulty eating, early satiety, or urinary frequency or urgency. Symptoms that persist for several weeks and are a change from a woman's baseline should prompt evaluation by her physician. Current data does not support routine ovarian screening for Lynch syndrome, therefore it may be considered at the clinician's discretion. Screening includes transvaginal ultrasounds and a blood test to measure CA-125 levels every 6-12 months.   Urothelial Cancer Screening: Annual urinalysis starting at age 48-35 may be considered in selected individuals such as those with a family history of urothelial cancer (renal pelvis, ureter, and/or bladder).   Gastric and Small Bowel Cancer Screening: Esophagogastroduodenoscopy (EGD) starting at age 48-40 years and repeat every 2-4 years, preferably performed in conjunction with colonoscopy.  Age of initiation prior to 30 years and/or surveillance interval less than 2  years may be considered based on family history or high-risk endoscopic findings. Random biopsy of the proximal and distal stomach should at minimum be performed on the initial procedure to assess for H. Pylori, autoimmune gastritis, and intestinal metaplasia.  Push enteroscopy can be considered in place of EGD to enhance small bowel visualization. Individuals not undergoing upper endoscopic surveillance should have one-time noninvasive testing for H. pylori at the time of Lynch Syndrome diagnosis, with treatment indicated if H. pylori is detected.    Pancreatic Cancer Screening: Avoid smoking, heavy alcohol use, and obesity. It has been suggested that pancreatic cancer screening be limited to those with a family history of pancreatic cancer (first- or second-degree relative). Ideally, screening should be performed in experienced centers utilizing a multidisciplinary approach under research conditions. Recommended screening include annual endoscopic ultrasound (preferred) and/or MRI of the pancreas starting at age 53 or 10 years younger than the earliest age diagnosis in the family. Annual concurrent CA19-9 testing should also be considered.   Brain Cancer Screening: Patients should be educated regarding signs and symptoms of neurologic cancer and the importance of prompt reporting of abnormal symptoms to their physicians.   Prostate Cancer Screening: Consider beginning annual PSA blood test and digital rectal exams at age 48.   Skin manifestations: Frequency of malignant and benign skin tumors such as sebaceous adenocarcinomas, sebaceous adenomas, and keratoacanthomas has been reported to be increased amount LS patients, cumulative risk uncertain Consider skin exam every 1-2 years with health care provider skilled in identifying LS-associated  skin manifestations   Additional Considerations: Patients of reproductive age should be made aware of options for prenatal diagnosis and assisted  reproduction including pre-implantation genetic diagnosis. Individuals with a single pathogenic MSH6 variant are also carriers of constitutional MMR deficiency (CMMRD) syndrome. CMMRD is a childhood-onset cancer predisposition syndrome that can present with hematological malignancies, cancers of the brain and central nervous system, Lynch syndrome-associated cancers (colon, uterine, small bowel, urinary tract), embryonic tumors, and sarcomas. Some affected individuals may also display cafe-au-lait macules. For there to be a risk of CMMRD in offspring, an individual and their partner would each have to have a single pathogenic variant in the same MMR gene; in such a case, the risk of having an affected child is 25%.   This information is based on current understanding of the gene and may change in the future.   Implications for Family Members: Hereditary predisposition to cancer due to pathogenic variants in the MSH2 gene has autosomal dominant inheritance. This means that an individual with a pathogenic variant has a 50% chance of passing the condition on to his/her offspring. Most cases are inherited from a parent, but some cases may occur spontaneously (i.e., an individual with a pathogenic variant has parents who do not have it). Identification of a pathogenic variant allows for the recognition of at-risk relatives who can pursue testing for the familial variant.   Family members are encouraged to consider genetic testing for this familial pathogenic variant. As there are generally no childhood cancer risks associated with pathogenic variants in the MSH2 gene, individuals in the family are not recommended to have testing until they reach at least 30 years of age. They may contact our office at [phone] for more information or to schedule an appointment.  Family members who live outside of the area are encouraged to find a genetic counselor in their area by visiting:  [email or web address].   Resources: Hereditary Colon Cancer Foundation- www.hcctakesguts.org   Lynch [employer]- www.lynchcancers.com   [employer]- www.kintalk.org   PLAN:   1. These results will be made available to  her PCP, Alyssa Allwardt, PA-C. She have also referred her to [facility], and she plans to establish care with a gynecologist soon. She would like her these providers to follow her long-term for this indication and coordinate screening/prophylactic surgeries.    2. Karen Simon plans to discuss these results with her family and will reach out to [place] if we can be of any assistance in coordinating genetic testing for any of her relatives.    We encouraged Karen Simon to remain in contact with [place] on an annual basis so we can update her personal and family histories, and let her know of advances in cancer genetics that may benefit the family. Our contact number was provided. Karen Simon questions were answered to her satisfaction today, and she knows she is welcome to call anytime with additional questions.    Lacy Duverney, MS, [facility] Genetic Counselor [email or web address] Phone: [phone]

## 2022-10-07 ENCOUNTER — Telehealth: Payer: Self-pay

## 2022-10-07 NOTE — Telephone Encounter (Signed)
Called patient and LVM to schedule an appointment for evaluation of Lynch Syndrome with Dr. Barron Alvine

## 2022-10-07 NOTE — Telephone Encounter (Signed)
-----   Message from Shellia Cleverly, DO sent at 10/06/2022  5:02 PM EDT ----- Can you please set this patient up for office visit for evaluation of Lynch Syndrome and initiation of Lynch screening protocol.  Thanks. ----- Message ----- From: Lacy Duverney T Sent: 10/05/2022   2:34 PM EDT To: Shellia Cleverly, DO  Hi Dr. Barron Alvine!  Hope you're well. 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 She says she did have a colonoscopy in 2022 but I can't find where. Would you/your team be able to see her to help manage her Lynch syndrome?  Thanks! Colin Mulders

## 2022-10-10 ENCOUNTER — Other Ambulatory Visit: Payer: Self-pay

## 2022-10-10 NOTE — Telephone Encounter (Signed)
Called patient and LVM to return the call to schedule an appointment for evaluation of Lynch Syndrome with Dr. Barron Alvine.

## 2022-10-10 NOTE — Telephone Encounter (Signed)
Appointment is scheduled with Dr. Barron Alvine on 01/17/23 at 9:40 AM. Detailed MyChart message sent to the patient. New patient paperwork mailed at patient's home address.

## 2023-01-17 ENCOUNTER — Ambulatory Visit: Payer: Medicaid Other | Admitting: Gastroenterology

## 2023-04-01 ENCOUNTER — Encounter (HOSPITAL_COMMUNITY): Payer: Self-pay | Admitting: *Deleted

## 2023-04-01 ENCOUNTER — Other Ambulatory Visit: Payer: Self-pay

## 2023-04-01 ENCOUNTER — Ambulatory Visit (HOSPITAL_COMMUNITY)
Admission: EM | Admit: 2023-04-01 | Discharge: 2023-04-01 | Disposition: A | Discharge status: Home / Self Care | Payer: Self-pay | Attending: Family Medicine | Admitting: Family Medicine

## 2023-04-01 DIAGNOSIS — J358 Other chronic diseases of tonsils and adenoids: Secondary | ICD-10-CM | POA: Insufficient documentation

## 2023-04-01 DIAGNOSIS — J039 Acute tonsillitis, unspecified: Secondary | ICD-10-CM | POA: Insufficient documentation

## 2023-04-01 LAB — POCT RAPID STREP A (OFFICE): Rapid Strep A Screen: NEGATIVE

## 2023-04-01 MED ORDER — AZITHROMYCIN 500 MG PO TABS: 500.0000 mg | ORAL_TABLET | Freq: Every day | ORAL | 0 refills | Status: AC

## 2023-04-01 NOTE — Discharge Instructions (Addendum)
It was nice seeing you. I will call you soon about your test results. In the meantime, I will treat you empirically with antibiotics for your tonsil edema. Please see your PCP soon to refer you to the ENT for reassessment. Will call with results.

## 2023-04-01 NOTE — ED Provider Notes (Signed)
[facility]    CSN: [id number] Arrival date & time: 04/01/23  1530      History   Chief Complaint Chief Complaint  Patient presents with   Sore Throat    HPI Karen Simon is a 32 y.o. female.   The history is provided by the patient. No language interpreter was used.  Sore Throat This is a new problem. The current episode started 2 days ago. The problem occurs constantly. The problem has been gradually worsening. Pertinent negatives include no chest pain, no abdominal pain, no headaches and no shortness of breath. Associated symptoms comments: Left tonsillar swelling. This is typical for her at this time of the year. She denies fever. Pain is worse with swallowing. Denies throat pain, just the tonsil swelling. The symptoms are aggravated by eating and swallowing. Nothing relieves the symptoms. She has tried acetaminophen for the symptoms. The treatment provided no relief.  Sexually active with her husband of many years and not worried about STDs.   Past Medical History:  Diagnosis Date   Anemia    Asthma    Enlarged heart    H/O candidiasis    H/O varicella    Irregular periods/menstrual cycles 07/01/09   Migraines    Morbid obesity (HCC) 09/17/2015   Seizure-like activity (HCC)    Seizures (HCC)     Patient Active Problem List   Diagnosis Date Noted   Genetic testing 10/05/2022   MSH2-related Lynch syndrome (HNPCC1) 10/05/2022   Partial symptomatic epilepsy with complex partial seizures, not intractable, without status epilepticus (HCC) 04/11/2019   Morbid obesity (HCC) 09/17/2015   Normal labor 02/23/2014   Lactating mother 11/27/2011   Teen pregnancy 11/27/2011   Pregnant state, incidental 11/25/2011   Vaginal delivery 11/25/2011   Perineal laceration complicating delivery 11/25/2011   Candida albicans infection 11/15/2011   Excess weight gain in pregnancy 11/07/2011   Anemia in pregnancy 09/14/2011   History of high blood pressure - never took  meds 04/12/2011   Latex allergy 04/12/2011   Shellfish allergy 04/12/2011   History of anemia 11/26/2010   Asthma 11/26/2010   Migraine 11/26/2010    Past Surgical History:  Procedure Laterality Date   CHOLECYSTECTOMY  03/20/2012   Procedure: LAPAROSCOPIC CHOLECYSTECTOMY;  Surgeon: Shelly Rubenstein, MD;  Location: [facility];  Service: General;  Laterality: N/A;    OB History     Gravida  2   Para  2   Term  2   Preterm  0   AB  0   Living  2      SAB  0   IAB  0   Ectopic  0   Multiple  0   Live Births  2            Home Medications    Prior to Admission medications   Medication Sig Start Date End Date Taking? Authorizing Provider  azithromycin (ZITHROMAX) 500 MG tablet Take 1 tablet (500 mg total) by mouth daily for 5 days. 04/01/23 04/06/23 Yes Doreene Eland, MD  JUNEL FE 1/20 1-20 MG-MCG tablet Take 1 tablet by mouth once daily 06/14/21  Yes Allwardt, Alyssa M, PA-C  Multiple Vitamin (MULTIVITAMIN) tablet Take 1 tablet by mouth daily.   Yes [provider]  MAGNESIUM PO Take 30 mg by mouth daily.    [provider]    Family History Family History  Problem Relation Age of Onset   Hypertension Mother    Endometrial cancer Mother  56       MSH2+ Lynch syndrome   Seizures Father    Sleep apnea Father    Other Sister        MSH2+   Seizures Paternal Aunt        x 3 aunts   Breast cancer Maternal Grandmother 35   Colon cancer Maternal Grandfather 34   Prostate cancer Maternal Grandfather    Breast cancer Cousin 27   Anesthesia problems Neg Hx     Social History Social History   Tobacco Use   Smoking status: Never   Smokeless tobacco: Never  Vaping Use   Vaping status: Every Day  Substance Use Topics   Alcohol use: Yes   Drug use: Yes    Types: Marijuana     Allergies   Latex, Penicillins, Tomato, Iodine, and Shellfish allergy   Review of Systems Review of Systems  Respiratory:  Negative for shortness of  breath.   Cardiovascular:  Negative for chest pain.  Gastrointestinal:  Negative for abdominal pain.  Neurological:  Negative for headaches.  All other systems reviewed and are negative.    Physical Exam Triage Vital Signs ED Triage Vitals  Encounter Vitals Group     BP 04/01/23 1615 118/77     Systolic BP Percentile --      Diastolic BP Percentile --      Pulse Rate 04/01/23 1615 68     Resp 04/01/23 1615 18     Temp 04/01/23 1615 98.5 F (36.9 C)     Temp src --      SpO2 04/01/23 1615 96 %     Weight --      Height --      Head Circumference --      Peak Flow --      Pain Score 04/01/23 1613 0     Pain Loc --      Pain Education --      Exclude from Growth Chart --    No data found.  Updated Vital Signs BP 118/77   Pulse 68   Temp 98.5 F (36.9 C)   Resp 18   LMP 04/01/2023   SpO2 96%   Visual Acuity Right Eye Distance:   Left Eye Distance:   Bilateral Distance:    Right Eye Near:   Left Eye Near:    Bilateral Near:     Physical Exam Vitals and nursing note reviewed.  Constitutional:      Appearance: She is not ill-appearing.  HENT:     Mouth/Throat:     Mouth: Mucous membranes are moist.     Pharynx: Posterior oropharyngeal erythema present. No oropharyngeal exudate.     Tonsils: No tonsillar abscesses.     Comments: Large shallow ulcer with yellowish base of her left tonsil which is also enlarged and red compared to the right Cardiovascular:     Rate and Rhythm: Normal rate and regular rhythm.     Heart sounds: Normal heart sounds. No murmur heard. Pulmonary:     Effort: Pulmonary effort is normal. No respiratory distress.     Breath sounds: Normal breath sounds. No wheezing.  Musculoskeletal:     Cervical back: No rigidity.  Lymphadenopathy:     Cervical: No cervical adenopathy.  Neurological:     Mental Status: She is alert.      UC Treatments / Results  Labs (all labs ordered are listed, but only abnormal results are  displayed) Labs Reviewed  CULTURE, GROUP  A STREP [facility])  POCT RAPID STREP A (OFFICE)    EKG   Radiology No results found.  Procedures Procedures (including critical care time)  Medications Ordered in UC Medications - No data to display  Initial Impression / Assessment and Plan / UC Course  I have reviewed the triage vital signs and the nursing notes.  Pertinent labs & imaging results that were available during my care of the patient were reviewed by me and considered in my medical decision making (see chart for details).  Clinical Course as of 04/01/23 1710  Sat Apr 01, 2023  1707 Tonsillar erythema, swelling and ulceration ?? Bacterial/strep pharyngitis vs. Crohn vs. bechet Rapid strep is pending at this time Throat culture was also sent. Regardless will be treated empirically with A/B pending ENT eval I will call her with her POC rapid strep and throat culture results Zithromax e-scribed as she has an allergy to Penicillin ENT eval recommended She agreed with the plan [KE]    Clinical Course User Index [KE] Doreene Eland, MD   Final Clinical Impressions(s) / UC Diagnoses   Final diagnoses:  Tonsil ulcer  Acute tonsillitis, unspecified etiology     Discharge Instructions      It was nice seeing you. I will call you soon about your test results. In the meantime, I will treat you empirically with antibiotics for your tonsil edema. Please see your PCP soon to refer you to the ENT for reassessment. Will call with results.      ED Prescriptions     Medication Sig Dispense Auth. Provider   azithromycin (ZITHROMAX) 500 MG tablet Take 1 tablet (500 mg total) by mouth daily for 5 days. 5 tablet Doreene Eland, MD      PDMP not reviewed this encounter.   Doreene Eland, MD 04/01/23 1710

## 2023-04-01 NOTE — ED Triage Notes (Signed)
Pt reports white spots on her tonsils and has a Hx of tonsil issues.

## 2023-04-03 LAB — CULTURE, GROUP A STREP (THRC)

## 2023-04-12 ENCOUNTER — Ambulatory Visit
Admission: RE | Admit: 2023-04-12 | Discharge: 2023-04-12 | Disposition: A | Discharge status: Home / Self Care | Payer: Medicaid Other | Source: Ambulatory Visit | Attending: Internal Medicine | Admitting: Internal Medicine

## 2023-04-12 VITALS — BP 129/78 | HR 63 | Temp 97.9°F | Resp 16

## 2023-04-12 DIAGNOSIS — G43819 Other migraine, intractable, without status migrainosus: Secondary | ICD-10-CM

## 2023-04-12 MED ORDER — DEXAMETHASONE SODIUM PHOSPHATE 10 MG/ML IJ SOLN
10.0000 mg | Freq: Once | INTRAMUSCULAR | Status: AC
  Administered 2023-04-12: 10 mg via INTRAMUSCULAR

## 2023-04-12 MED ORDER — KETOROLAC TROMETHAMINE 30 MG/ML IJ SOLN
30.0000 mg | Freq: Once | INTRAMUSCULAR | Status: AC
  Administered 2023-04-12: 30 mg via INTRAMUSCULAR

## 2023-04-12 NOTE — ED Triage Notes (Signed)
Pt present to UC for c/o migraine for 6 days. Pt reports it started with her eyebrow hurting and progressed to her vomiting and having light sensitivity. Reports tylenol not helping

## 2023-04-12 NOTE — ED Provider Notes (Signed)
[facility]    CSN: [id number] Arrival date & time: 04/12/23  1609      History   Chief Complaint Chief Complaint  Patient presents with   Migraine    APPT 1600    HPI Karen Simon is a 32 y.o. female presents for headache.  Patient with a history of migraines and reports 6 days ago she developed a left-sided migraine that has been waxing and waning since that time.  it is associated with photosensitivity, nausea/vomiting.  She denies any dizziness, syncope, visual changes, neck pain, fevers.  Currently rates her headache as a 7 out of 10 and denies worst headache of her life.  She has been taking over-the-counter Tylenol and ibuprofen as well as intermittently taking a family members prednisone.  She reports these treatments temporarily improve her headache but does not resolve it.  Patient states she previously followed with neurology and will reestablish with them in January once her new insurance starts.  Denies thunderclap headache or family history of first-degree relative with SAH.  No other concerns at this time.   Migraine Associated symptoms include headaches.    Past Medical History:  Diagnosis Date   Anemia    Asthma    Enlarged heart    H/O candidiasis    H/O varicella    Irregular periods/menstrual cycles 07/01/09   Migraines    Morbid obesity (HCC) 09/17/2015   Seizure-like activity (HCC)    Seizures (HCC)     Patient Active Problem List   Diagnosis Date Noted   Genetic testing 10/05/2022   MSH2-related Lynch syndrome (HNPCC1) 10/05/2022   Partial symptomatic epilepsy with complex partial seizures, not intractable, without status epilepticus (HCC) 04/11/2019   Morbid obesity (HCC) 09/17/2015   Normal labor 02/23/2014   Lactating mother 11/27/2011   Teen pregnancy 11/27/2011   Pregnant state, incidental 11/25/2011   Vaginal delivery 11/25/2011   Perineal laceration complicating delivery 11/25/2011   Candida albicans infection 11/15/2011    Excess weight gain in pregnancy 11/07/2011   Anemia in pregnancy 09/14/2011   History of high blood pressure - never took meds 04/12/2011   Latex allergy 04/12/2011   Shellfish allergy 04/12/2011   History of anemia 11/26/2010   Asthma 11/26/2010   Migraine 11/26/2010    Past Surgical History:  Procedure Laterality Date   CHOLECYSTECTOMY  03/20/2012   Procedure: LAPAROSCOPIC CHOLECYSTECTOMY;  Surgeon: Shelly Rubenstein, MD;  Location: [facility];  Service: General;  Laterality: N/A;    OB History     Gravida  2   Para  2   Term  2   Preterm  0   AB  0   Living  2      SAB  0   IAB  0   Ectopic  0   Multiple  0   Live Births  2            Home Medications    Prior to Admission medications   Medication Sig Start Date End Date Taking? Authorizing Provider  JUNEL FE 1/20 1-20 MG-MCG tablet Take 1 tablet by mouth once daily 06/14/21  Yes Allwardt, Alyssa M, PA-C  MAGNESIUM PO Take 30 mg by mouth daily.   Yes [provider]  Multiple Vitamin (MULTIVITAMIN) tablet Take 1 tablet by mouth daily.   Yes [provider]    Family History Family History  Problem Relation Age of Onset   Hypertension Mother    Endometrial cancer Mother 46  MSH2+ Lynch syndrome   Seizures Father    Sleep apnea Father    Other Sister        MSH2+   Seizures Paternal Aunt        x 3 aunts   Breast cancer Maternal Grandmother 35   Colon cancer Maternal Grandfather 34   Prostate cancer Maternal Grandfather    Breast cancer Cousin 48   Anesthesia problems Neg Hx     Social History Social History   Tobacco Use   Smoking status: Never   Smokeless tobacco: Never  Vaping Use   Vaping status: Never Used  Substance Use Topics   Alcohol use: Yes   Drug use: Yes    Types: Marijuana     Allergies   Latex, Penicillins, Tomato, Iodine, and Shellfish allergy   Review of Systems Review of Systems  Eyes:  Positive for photophobia.  Gastrointestinal:   Positive for nausea and vomiting.  Neurological:  Positive for headaches.     Physical Exam Triage Vital Signs ED Triage Vitals  Encounter Vitals Group     BP 04/12/23 1706 129/78     Systolic BP Percentile --      Diastolic BP Percentile --      Pulse Rate 04/12/23 1706 63     Resp 04/12/23 1706 16     Temp 04/12/23 1706 97.9 F (36.6 C)     Temp Source 04/12/23 1706 Oral     SpO2 04/12/23 1706 98 %     Weight --      Height --      Head Circumference --      Peak Flow --      Pain Score 04/12/23 1705 7     Pain Loc --      Pain Education --      Exclude from Growth Chart --    No data found.  Updated Vital Signs BP 129/78 (BP Location: Right Arm)   Pulse 63   Temp 97.9 F (36.6 C) (Oral)   Resp 16   LMP 04/01/2023   SpO2 98%   Visual Acuity Right Eye Distance:   Left Eye Distance:   Bilateral Distance:    Right Eye Near:   Left Eye Near:    Bilateral Near:     Physical Exam Vitals and nursing note reviewed.  Constitutional:      General: She is not in acute distress.    Appearance: Normal appearance. She is not ill-appearing, toxic-appearing or diaphoretic.  HENT:     Head: Normocephalic and atraumatic.  Eyes:     Extraocular Movements: Extraocular movements intact.     Conjunctiva/sclera: Conjunctivae normal.     Pupils: Pupils are equal, round, and reactive to light.  Cardiovascular:     Rate and Rhythm: Normal rate.  Pulmonary:     Effort: Pulmonary effort is normal.  Skin:    General: Skin is warm and dry.  Neurological:     General: No focal deficit present.     Mental Status: She is alert and oriented to person, place, and time.     GCS: GCS eye subscore is 4. GCS verbal subscore is 5. GCS motor subscore is 6.     Cranial Nerves: No facial asymmetry.     Motor: No weakness.     Coordination: Romberg sign negative. Finger-Nose-Finger Test normal.  Psychiatric:        Mood and Affect: Mood normal.        Behavior: Behavior  normal.       UC Treatments / Results  Labs (all labs ordered are listed, but only abnormal results are displayed) Labs Reviewed - No data to display  EKG   Radiology No results found.  Procedures Procedures (including critical care time)  Medications Ordered in UC Medications  ketorolac (TORADOL) 30 MG/ML injection 30 mg (30 mg Intramuscular Given 04/12/23 1720)  dexamethasone (DECADRON) injection 10 mg (10 mg Intramuscular Given 04/12/23 1720)    Initial Impression / Assessment and Plan / UC Course  I have reviewed the triage vital signs and the nursing notes.  Pertinent labs & imaging results that were available during my care of the patient were reviewed by me and considered in my medical decision making (see chart for details).     Reviewed exam and symptoms with patient.  No red flags.  Patient given Toradol and Decadron in clinic.  Monitored for 10 minutes after injection with no reaction noted and tolerated well.  Patient reports improvement in symptoms.  She was instructed no NSAIDs for 24 hours and verbalized understanding.  Patient to follow-up with PCP or neurology for further treatment options.  ER precautions reviewed. Final Clinical Impressions(s) / UC Diagnoses   Final diagnoses:  Other migraine without status migrainosus, intractable     Discharge Instructions      You were given a Toradol injection in clinic today. Do not take any over the counter NSAID's such as Advil, ibuprofen, Aleve, or naproxen for 24 hours. You may take tylenol if needed Follow-up with your PCP or neurologist for further treatment options of your migraines.  Please go to the ER if you develop any worsening symptoms.  I hope you feel better soon!      ED Prescriptions   None    PDMP not reviewed this encounter.   Radford Pax, NP 04/12/23 1740

## 2023-04-12 NOTE — Discharge Instructions (Addendum)
You were given a Toradol injection in clinic today. Do not take any over the counter NSAID's such as Advil, ibuprofen, Aleve, or naproxen for 24 hours. You may take tylenol if needed Follow-up with your PCP or neurologist for further treatment options of your migraines.  Please go to the ER if you develop any worsening symptoms.  I hope you feel better soon!

## 2023-06-28 ENCOUNTER — Ambulatory Visit: Payer: Self-pay | Admitting: Physician Assistant

## 2023-06-28 NOTE — Telephone Encounter (Addendum)
 Copied from CRM [id number]. Topic: Clinical - Red Word Triage >> Jun 28, 2023  3:36 PM Corin V wrote: [facility] that prompted transfer to Nurse Triage: Patient is having bad cramps that started yesterday morning. Was a 9/10 until this morning and has come down to a 6/10. Unsure what initiated the pain.  10 am yesterday  Belly button toward right side of abd Cramping and like gas  Constant  Chief Complaint: abd pain Symptoms: cramping and gas Frequency: yesterday Pertinent Negatives: Patient denies vomiting, bloating, diarrhea Disposition: [] ED /[] Urgent Care (no appt availability in office) / [x] Appointment(In office/virtual)/ []  [facility]/ [] Home Care/ [] Refused Recommended Disposition /[] [place]/ []  Follow-up with PCP Additional Notes: advised pt that no appt noted today. If worsens, advised to go to go to UC/ED. Pt has appt tomorrow that was previously scheduled. Offered to look at the other [facility] offices for VV but pt stated she will wait.   Reason for Disposition  [1] MILD-MODERATE pain AND [2] constant AND [3] present > 2 hours  Answer Assessment - Initial Assessment Questions 1. LOCATION: "Where does it hurt?"      Navel area across to right side 2. RADIATION: "Does the pain shoot anywhere else?" (e.g., chest, back)     To right side 3. ONSET: "When did the pain begin?" (e.g., minutes, hours or days ago)      10 am today 4. SUDDEN: "Gradual or sudden onset?"     sudden 5. PATTERN "Does the pain come and go, or is it constant?"    - If it comes and goes: "How long does it last?" "Do you have pain now?"     (Note: Comes and goes means the pain is intermittent. It goes away completely between bouts.)    - If constant: "Is it getting better, staying the same, or getting worse?"      (Note: Constant means the pain never goes away completely; most serious pain is constant and gets worse.)      constnat 6. SEVERITY: "How bad is the pain?"  (e.g.,  Scale 1-10; mild, moderate, or severe)    - MILD (1-3): Doesn't interfere with normal activities, abdomen soft and not tender to touch.     - MODERATE (4-7): Interferes with normal activities or awakens from sleep, abdomen tender to touch.     - SEVERE (8-10): Excruciating pain, doubled over, unable to do any normal activities.       Varies but currently 6/10  7. RECURRENT SYMPTOM: "Have you ever had this type of stomach pain before?" If Yes, ask: "When was the last time?" and "What happened that time?"      no 8. CAUSE: "What do you think is causing the stomach pain?"     unsure 10. OTHER SYMPTOMS: "Do you have any other symptoms?" (e.g., back pain, diarrhea, fever, urination pain, vomiting)       no  Protocols used: Abdominal Pain - Female-A-AH

## 2023-06-29 ENCOUNTER — Ambulatory Visit: Payer: Medicaid Other | Admitting: Physician Assistant

## 2023-06-29 ENCOUNTER — Encounter: Payer: Self-pay | Admitting: Physician Assistant

## 2023-06-29 VITALS — BP 126/74 | HR 65 | Temp 97.5°F | Ht 65.0 in | Wt 261.6 lb

## 2023-06-29 DIAGNOSIS — Z22330 Carrier of Group B streptococcus: Secondary | ICD-10-CM

## 2023-06-29 DIAGNOSIS — K5909 Other constipation: Secondary | ICD-10-CM | POA: Diagnosis not present

## 2023-06-29 DIAGNOSIS — B070 Plantar wart: Secondary | ICD-10-CM

## 2023-06-29 DIAGNOSIS — Z23 Encounter for immunization: Secondary | ICD-10-CM

## 2023-06-29 HISTORY — DX: Carrier of group B Streptococcus: Z22.330

## 2023-06-29 NOTE — Telephone Encounter (Signed)
 Noted

## 2023-06-29 NOTE — Progress Notes (Signed)
 Patient ID: Karen Simon, female    DOB: 03/28/1991, 33 y.o.   MRN: [id number]   Assessment & Plan:  Other constipation  Plantar wart of right foot -     Ambulatory referral to Podiatry  Need for vaccination -     Tdap vaccine greater than or equal to 7yo IM    Assessment and Plan    Plantar Wart Persistent plantar wart on the right foot, fourth toe, causing discomfort. Over-the-counter treatments ineffective. -Refer to podiatry for further management.  Constipation Recent episode of severe abdominal pain due to constipation despite regular bowel movements. Resolved with laxatives. -Start fiber supplement, beginning with half the recommended dose to prevent gas and bloating.  General Health Maintenance -Administer tetanus vaccine today.          Return if symptoms worsen or fail to improve.    Subjective:    Chief Complaint  Patient presents with   Dysmenorrhea    Pt in office c/o abdominal pain/cramping that radiates to right side; pt triaged e2c2 yesterday; pt advised she took laxatives and has since had bowel movements and feels a ton better per patient; denies frequent constipation; pt due for annual CPE last was 2022;     HPI Discussed the use of AI scribe software for clinical note transcription with the patient, who gave verbal consent to proceed.  History of Present Illness   ERETRIA MANTERNACH is a 33 year old female who presents with constipation and a persistent lesion on her toe.  She experienced severe abdominal pain yesterday, which she later identified as constipation. She describes frequent bowel movements but questions their completeness, stating 'I shit so much.' Initially, she thought the pain was due to gas and took gas tablets, which did not alleviate her symptoms. She eventually took laxatives, which resolved the issue, and she now feels 'one hundred percent better.' No issues with her menstrual periods and no other significant  symptoms aside from the constipation.  She has a persistent lesion on her right foot, specifically on the fourth toe, which she initially thought was a wart. The lesion has been present for over a year and has not resolved despite treatment with over-the-counter remedies, including freezing treatments. The lesion becomes inflamed and painful, especially when wearing constrictive footwear like gym shoes. She describes the pain as noticeable when walking and during periods of increased activity.  She is very active, spending time in the gym every day and has gained significant muscle mass. She finds this activity beneficial for her overall well-being, stating it 'saved my life' by providing an outlet for stress relief.       Past Medical History:  Diagnosis Date   Anemia    Asthma    Enlarged heart    H/O candidiasis    H/O varicella    Irregular periods/menstrual cycles 07/01/09   Migraines    Morbid obesity (HCC) 09/17/2015   Seizure-like activity (HCC)    Seizures (HCC)     Past Surgical History:  Procedure Laterality Date   CHOLECYSTECTOMY  03/20/2012   Procedure: LAPAROSCOPIC CHOLECYSTECTOMY;  Surgeon: Shelly Rubenstein, MD;  Location: [facility];  Service: General;  Laterality: N/A;    Family History  Problem Relation Age of Onset   Hypertension Mother    Endometrial cancer Mother 78       MSH2+ Lynch syndrome   Seizures Father    Sleep apnea Father    Other Sister  MSH2+   Seizures Paternal Aunt        x 3 aunts   Breast cancer Maternal Grandmother 35   Colon cancer Maternal Grandfather 34   Prostate cancer Maternal Grandfather    Breast cancer Cousin 10   Anesthesia problems Neg Hx     Social History   Tobacco Use   Smoking status: Never   Smokeless tobacco: Never  Vaping Use   Vaping status: Never Used  Substance Use Topics   Alcohol use: Yes   Drug use: Yes    Types: Marijuana     Allergies  Allergen Reactions   Latex Hives   Penicillins  Anaphylaxis, Itching and Swelling   Tomato Hives and Swelling   Iodine Hives   Shellfish Allergy Hives    Review of Systems NEGATIVE UNLESS OTHERWISE INDICATED IN HPI      Objective:     BP 126/74 (BP Location: Left Arm, Patient Position: Sitting)   Pulse 65   Temp (!) 97.5 F (36.4 C) (Temporal)   Ht 5\' 5"  (1.651 m)   Wt 261 lb 9.6 oz (118.7 kg)   SpO2 99%   BMI 43.53 kg/m   Wt Readings from Last 3 Encounters:  06/29/23 261 lb 9.6 oz (118.7 kg)  08/19/22 240 lb 3.2 oz (109 kg)  09/19/21 242 lb (109.8 kg)    BP Readings from Last 3 Encounters:  06/29/23 126/74  04/12/23 129/78  04/01/23 118/77     Physical Exam Vitals and nursing note reviewed.  Constitutional:      Appearance: Normal appearance. She is obese. She is not toxic-appearing.  HENT:     Head: Normocephalic and atraumatic.     Right Ear: External ear normal.     Left Ear: External ear normal.     Nose: Nose normal.     Mouth/Throat:     Mouth: Mucous membranes are moist.  Eyes:     Extraocular Movements: Extraocular movements intact.     Conjunctiva/sclera: Conjunctivae normal.     Pupils: Pupils are equal, round, and reactive to light.  Cardiovascular:     Rate and Rhythm: Normal rate and regular rhythm.     Pulses: Normal pulses.     Heart sounds: Normal heart sounds.  Pulmonary:     Effort: Pulmonary effort is normal.     Breath sounds: Normal breath sounds.  Abdominal:     General: Abdomen is flat. Bowel sounds are normal.     Palpations: Abdomen is soft.  Musculoskeletal:        General: Normal range of motion.     Cervical back: Normal range of motion and neck supple.  Skin:    General: Skin is warm and dry.     Findings: Lesion (large plantar wart R foot 4th lateral toe) present.  Neurological:     General: No focal deficit present.     Mental Status: She is alert and oriented to person, place, and time.  Psychiatric:        Mood and Affect: Mood normal.        Behavior:  Behavior normal.        Thought Content: Thought content normal.        Judgment: Judgment normal.       Terre Zabriskie M Naziah Weckerly, PA-C

## 2023-07-14 ENCOUNTER — Ambulatory Visit (INDEPENDENT_AMBULATORY_CARE_PROVIDER_SITE_OTHER): Payer: BC Managed Care – PPO | Admitting: Podiatry

## 2023-07-14 ENCOUNTER — Encounter: Payer: Self-pay | Admitting: Podiatry

## 2023-07-14 DIAGNOSIS — L84 Corns and callosities: Secondary | ICD-10-CM | POA: Diagnosis not present

## 2023-07-14 DIAGNOSIS — N912 Amenorrhea, unspecified: Secondary | ICD-10-CM | POA: Insufficient documentation

## 2023-07-14 NOTE — Progress Notes (Signed)
  Subjective:  Patient ID: Karen Simon, female    DOB: 08/01/1990,  MRN: [id number]  Chief Complaint  Patient presents with   Callouses    Place in between on right foot 5th toe. Pt stated that she thought it was a wart and she tried to freeze it off with over the counter treatment but it got bigger and painful     33 y.o. female presents with the above complaint.  Patient presents with right fourth and fifth interdigital space heloma molle pain on palpation.  Patient is thought that it was a wart she tried freezing which has not helped.  She wanted get it evaluated pain scale is 5 out of 10 dull aching nature hurts with ambulation worse with pressure.   Review of Systems: Negative except as noted in the HPI. Denies N/V/F/Ch.  Past Medical History:  Diagnosis Date   Anemia    Asthma    Enlarged heart    H/O candidiasis    H/O varicella    Irregular periods/menstrual cycles 07/01/09   Migraines    Morbid obesity (HCC) 09/17/2015   Seizure-like activity (HCC)    Seizures (HCC)     Current Outpatient Medications:    Doxylamine-Pyridoxine (DICLEGIS) 10-10 MG TBEC, Take 1 tablet twice a day by oral route as needed., Disp: , Rfl:    Creatine POWD, 2 g by Does not apply route daily. Pt uses along with pre work out daily, Disp: , Rfl:    EPINEPHrine (EPIPEN 2-PAK) 0.3 mg/0.3 mL IJ SOAJ injection, , Disp: , Rfl:    estradiol (ESTRACE) 1 MG tablet, , Disp: , Rfl:    fluconazole (DIFLUCAN) 100 MG tablet, , Disp: , Rfl:    HYDROcodone-acetaminophen (NORCO/VICODIN) 5-325 MG tablet, , Disp: , Rfl:    ibuprofen (ADVIL) 600 MG tablet, , Disp: , Rfl:    JUNEL FE 1/20 1-20 MG-MCG tablet, Take 1 tablet by mouth once daily, Disp: 28 tablet, Rfl: 0   MAGNESIUM PO, Take 30 mg by mouth daily., Disp: , Rfl:    Multiple Vitamin (MULTIVITAMIN) tablet, Take 1 tablet by mouth daily., Disp: , Rfl:   Social History   Tobacco Use  Smoking Status Never  Smokeless Tobacco Never    Allergies   Allergen Reactions   Latex Hives   Penicillins Anaphylaxis, Itching and Swelling   Tomato Hives and Swelling   Iodine Hives   Shellfish Allergy Hives   Objective:  There were no vitals filed for this visit. There is no height or weight on file to calculate BMI. Constitutional Well developed. Well nourished.  Vascular Dorsalis pedis pulses palpable bilaterally. Posterior tibial pulses palpable bilaterally. Capillary refill normal to all digits.  No cyanosis or clubbing noted. Pedal hair growth normal.  Neurologic Normal speech. Oriented to person, place, and time. Epicritic sensation to light touch grossly present bilaterally.  Dermatologic Right fourth and fifth digitheloma molle pain on palpation hyperkeratotic lesion noted.  No open wounds or lesion noted.  Orthopedic: Normal joint ROM without pain or crepitus bilaterally. No visible deformities. No bony tenderness.   Radiographs: None Assessment:  No diagnosis found. Plan:  Patient was evaluated and treated and all questions answered.  Right fourth and fifth interdigital space heloma molle -All questions and concerns were discussed with the patient extensive detail -I discussed shoe gear modification extensive detail -Also encourage spacer as well -If there is no improvement we will discuss surgical options she states understanding  No follow-ups on file.

## 2023-08-01 ENCOUNTER — Other Ambulatory Visit: Payer: Self-pay | Admitting: Physician Assistant

## 2023-08-01 MED ORDER — JUNEL FE 1/20 1-20 MG-MCG PO TABS: 1.0000 | ORAL_TABLET | Freq: Every day | ORAL | 0 refills | Status: DC

## 2023-08-21 ENCOUNTER — Encounter: Payer: Self-pay | Admitting: Physician Assistant

## 2023-08-21 ENCOUNTER — Ambulatory Visit (INDEPENDENT_AMBULATORY_CARE_PROVIDER_SITE_OTHER): Payer: Self-pay | Admitting: Physician Assistant

## 2023-08-21 VITALS — BP 118/78 | HR 74 | Temp 98.3°F | Ht 65.0 in | Wt 262.2 lb

## 2023-08-21 DIAGNOSIS — Z1159 Encounter for screening for other viral diseases: Secondary | ICD-10-CM

## 2023-08-21 DIAGNOSIS — Z124 Encounter for screening for malignant neoplasm of cervix: Secondary | ICD-10-CM

## 2023-08-21 DIAGNOSIS — Z0000 Encounter for general adult medical examination without abnormal findings: Secondary | ICD-10-CM

## 2023-08-21 NOTE — Progress Notes (Signed)
 Patient ID: Karen Simon, female    DOB: 11-25-1990, 33 y.o.   MRN: [id number]   Assessment & Plan:  Annual physical exam  Need for hepatitis C screening test  Screening for cervical cancer   Assessment & Plan  General Health Maintenance 33 year old female with a family history of breast and endometrial cancer. Discussed regular screenings, including Pap smears and cancer screenings. Recommend she quit smoking cannabis for long-term health care.  - Refer to gynecologist for Pap smear and comprehensive gynecological exam. - Order fasting blood work to assess cholesterol levels. - Advise scheduling a morning lab appointment for blood work.   Age-appropriate screening and counseling performed today. Will check labs and call with results. Preventive measures discussed and printed in AVS for patient.   Patient Counseling: [x]   Nutrition: Stressed importance of moderation in sodium/caffeine intake, saturated fat and cholesterol, caloric balance, sufficient intake of fresh fruits, vegetables, and fiber.  [x]   Stressed the importance of regular exercise.   [x]   Substance Abuse: Discussed cessation/primary prevention of tobacco, alcohol, or other drug use; driving or other dangerous activities under the influence; availability of treatment for abuse.   []   Injury prevention: Discussed safety belts, safety helmets, smoke detector, smoking near bedding or upholstery.   []   Sexuality: Discussed sexually transmitted diseases, partner selection, use of condoms, avoidance of unintended pregnancy  and contraceptive alternatives.   [x]   Dental health: Discussed importance of regular tooth brushing, flossing, and dental visits.  [x]   Health maintenance and immunizations reviewed. Please refer to Health maintenance section.          No follow-ups on file.    Subjective:    Chief Complaint  Patient presents with   Annual Exam    Non fasting w/ labs    HPI Discussed the use of  AI scribe software for clinical note transcription with the patient, who gave verbal consent to proceed.  History of Present Illness Karen Simon is a 33 year old female who presents for an annual physical exam.  No current skin issues, headaches, vision changes, chest pain, or shortness of breath. 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  History of chronic low hemoglobin levels without current symptoms.  Has not seen a gynecologist since the birth of her daughter, who is turning [age] this year, and is overdue for a Pap smear.  Social drinker, smokes occasionally, and is physically active with regular strength training. She is becoming a Systems analyst, reports good nutrition, and enjoys making fitness fun.     Past Medical History:  Diagnosis Date   Anemia    Asthma    Enlarged heart    H/O candidiasis    H/O varicella    Irregular periods/menstrual cycles 07/01/09   Migraines    Morbid obesity (HCC) 09/17/2015   Seizure-like activity (HCC)    Seizures (HCC)     Past Surgical History:  Procedure Laterality Date   CHOLECYSTECTOMY  03/20/2012   Procedure: LAPAROSCOPIC CHOLECYSTECTOMY;  Surgeon: Shelly Rubenstein, MD;  Location: [facility];  Service: General;  Laterality: N/A;    Family History  Problem Relation Age of Onset   Hypertension Mother    Endometrial cancer Mother 68       MSH2+ Lynch syndrome   Seizures Father    Sleep apnea Father    Other Sister        MSH2+   Seizures Paternal Aunt  x 3 aunts   Breast cancer Maternal Grandmother 35   Colon cancer Maternal Grandfather 34   Prostate cancer Maternal Grandfather    Breast cancer Cousin 33   Anesthesia problems Neg Hx     Social History   Tobacco Use   Smoking status: Never   Smokeless tobacco: Never  Vaping Use   Vaping status: Never Used  Substance Use Topics   Alcohol use: Yes   Drug use: Yes    Types: Marijuana      Allergies  Allergen Reactions   Latex Hives   Penicillins Anaphylaxis, Itching and Swelling   Tomato Hives and Swelling   Iodine Hives   Shellfish Allergy Hives    Review of Systems NEGATIVE UNLESS OTHERWISE INDICATED IN HPI      Objective:     BP 118/78 (BP Location: Left Arm, Patient Position: Sitting, Cuff Size: Large)   Pulse 74   Temp 98.3 F (36.8 C) (Temporal)   Ht 5\' 5"  (1.651 m)   Wt 262 lb 4 oz (119 kg)   LMP 08/18/2023 (Exact Date)   SpO2 100%   BMI 43.64 kg/m   Wt Readings from Last 3 Encounters:  08/21/23 262 lb 4 oz (119 kg)  06/29/23 261 lb 9.6 oz (118.7 kg)  08/19/22 240 lb 3.2 oz (109 kg)    BP Readings from Last 3 Encounters:  08/21/23 118/78  06/29/23 126/74  04/12/23 129/78     Physical Exam Vitals and nursing note reviewed.  Constitutional:      Appearance: Normal appearance. She is not toxic-appearing.  HENT:     Head: Normocephalic and atraumatic.     Right Ear: Tympanic membrane, ear canal and external ear normal.     Left Ear: Tympanic membrane, ear canal and external ear normal.     Nose: Nose normal.     Mouth/Throat:     Mouth: Mucous membranes are moist.  Eyes:     Extraocular Movements: Extraocular movements intact.     Conjunctiva/sclera: Conjunctivae normal.     Pupils: Pupils are equal, round, and reactive to light.  Cardiovascular:     Rate and Rhythm: Normal rate and regular rhythm.     Pulses: Normal pulses.     Heart sounds: Normal heart sounds.  Pulmonary:     Effort: Pulmonary effort is normal.     Breath sounds: Normal breath sounds.  Abdominal:     General: Abdomen is flat. Bowel sounds are normal.     Palpations: Abdomen is soft.  Musculoskeletal:        General: Normal range of motion.     Cervical back: Normal range of motion and neck supple.  Skin:    General: Skin is warm and dry.     Findings: No lesion or rash.  Neurological:     General: No focal deficit present.     Mental Status: She is  alert and oriented to person, place, and time.  Psychiatric:        Mood and Affect: Mood normal.        Behavior: Behavior normal.             Bryndle Corredor M Albertine Lafoy, PA-C

## 2023-08-25 ENCOUNTER — Other Ambulatory Visit: Payer: Self-pay | Admitting: Physician Assistant

## 2023-09-01 ENCOUNTER — Other Ambulatory Visit: Payer: Self-pay

## 2023-09-21 ENCOUNTER — Encounter: Payer: Self-pay | Admitting: Physician Assistant

## 2023-09-21 NOTE — Telephone Encounter (Signed)
 Please see pt msg and advise

## 2023-09-29 ENCOUNTER — Other Ambulatory Visit: Payer: Self-pay | Admitting: Physician Assistant

## 2023-10-11 ENCOUNTER — Other Ambulatory Visit (HOSPITAL_COMMUNITY)
Admission: RE | Admit: 2023-10-11 | Discharge: 2023-10-11 | Disposition: A | Discharge status: Home / Self Care | Source: Ambulatory Visit | Attending: Obstetrics and Gynecology | Admitting: Obstetrics and Gynecology

## 2023-10-11 ENCOUNTER — Encounter: Payer: Self-pay | Admitting: Obstetrics and Gynecology

## 2023-10-11 ENCOUNTER — Ambulatory Visit (INDEPENDENT_AMBULATORY_CARE_PROVIDER_SITE_OTHER): Admitting: Obstetrics and Gynecology

## 2023-10-11 VITALS — BP 122/74 | HR 74 | Ht 65.5 in | Wt 252.0 lb

## 2023-10-11 DIAGNOSIS — Z124 Encounter for screening for malignant neoplasm of cervix: Secondary | ICD-10-CM | POA: Insufficient documentation

## 2023-10-11 DIAGNOSIS — Z3041 Encounter for surveillance of contraceptive pills: Secondary | ICD-10-CM

## 2023-10-11 DIAGNOSIS — Z1331 Encounter for screening for depression: Secondary | ICD-10-CM | POA: Diagnosis not present

## 2023-10-11 DIAGNOSIS — N939 Abnormal uterine and vaginal bleeding, unspecified: Secondary | ICD-10-CM | POA: Diagnosis not present

## 2023-10-11 DIAGNOSIS — Z1509 Genetic susceptibility to other malignant neoplasm: Secondary | ICD-10-CM

## 2023-10-11 DIAGNOSIS — Z1211 Encounter for screening for malignant neoplasm of colon: Secondary | ICD-10-CM

## 2023-10-11 DIAGNOSIS — Z01419 Encounter for gynecological examination (general) (routine) without abnormal findings: Secondary | ICD-10-CM | POA: Diagnosis not present

## 2023-10-11 DIAGNOSIS — Z0000 Encounter for general adult medical examination without abnormal findings: Secondary | ICD-10-CM

## 2023-10-11 DIAGNOSIS — Z1159 Encounter for screening for other viral diseases: Secondary | ICD-10-CM

## 2023-10-11 NOTE — Assessment & Plan Note (Addendum)
 Cervical cancer screening performed according to ASCCP guidelines. Encouraged annual mammogram screening at 33yo, no indication for earlier screening Colonoscopy due, referral placed Labs and immunizations with her primary Encouraged safe sexual practices as indicated Encouraged healthy lifestyle practices with diet and exercise For patients under 50yo, I recommend 1000mg  calcium daily and 600IU of vitamin D daily.

## 2023-10-11 NOTE — Progress Notes (Signed)
 33 y.o. G74P2002 female with Lynch Syndrome (dx 2024, see genetics notes) on COC with hx of seizures here for annual exam. Divorced.  Patient's last menstrual period was 09/07/2023 (approximate). Period Duration (Days): 30 Period Pattern: (!) Irregular (has been lasting for 30 days) Menstrual Flow: Moderate Menstrual Control: Other (Comment) (flexi cup) Dysmenorrhea: (!) Severe Dysmenorrhea Symptoms: Nausea  She reports started COC 1 year ago for contraception. Cycles have been poorly controlled- irregular and prolonged. Has not been SA since divorce.  Abnormal bleeding: as noted Pelvic discharge or pain: none Breast mass, nipple discharge or skin changes : none  Sexually active: yes  Birth control: COC Last PAP: No results found for: "DIAGPAP", "HPVHIGH", "ADEQPAP" Gardasil: completed  Exercising: Yes. Power lifting in gym, planning for competition next year in [place] Smoker: no  Garment/textile technologist Visit from 10/11/2023 in [facility]  PHQ-2 Total Score 0       Flowsheet Row Office Visit from 08/19/2022 in [facility]  PHQ-9 Total Score 6       GYN HISTORY: Lynch Syndrome  OB History  Gravida Para Term Preterm AB Living  2 2 2  0 0 2  SAB IAB Ectopic Multiple Live Births  0 0 0 0 2    # Outcome Date GA Lbr Len/2nd Weight Sex Type Anes PTL Lv  2 Term 02/23/14 [redacted]w[redacted]d 06:54 / 00:06 6 lb 5.1 oz (2.865 kg) F Vag-Spont EPI  LIV  1 Term 11/25/11 [redacted]w[redacted]d 14:18 / 00:50 8 lb 5.9 oz (3.795 kg) M Vag-Spont EPI  LIV     Birth Comments: caput   Past Medical History:  Diagnosis Date   Anemia    Asthma    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   Enlarged heart    H/O candidiasis    H/O varicella    Irregular periods/menstrual cycles 07/01/2009   Migraines    Morbid obesity (HCC) 09/17/2015   Seizure-like activity (HCC)    Seizures (HCC)    Past Surgical History:  Procedure Laterality Date    CHOLECYSTECTOMY  03/20/2012   Procedure: LAPAROSCOPIC CHOLECYSTECTOMY;  Surgeon: Rogena Class, MD;  Location: [facility];  Service: General;  Laterality: N/A;   Current Outpatient Medications on File Prior to Visit  Medication Sig Dispense Refill   JUNEL  FE 1/20 1-20 MG-MCG tablet Take 1 tablet by mouth once daily 28 tablet 0   MAGNESIUM PO Take 30 mg by mouth daily.     Multiple Vitamin (MULTIVITAMIN) tablet Take 1 tablet by mouth daily.     No current facility-administered medications on file prior to visit.   Social History   Socioeconomic History   Marital status: Married    Spouse name: Not on file   Number of children: 2   Years of education: some college   Highest education level: GED or equivalent  Occupational History   Occupation: Physicist, medical   Occupation: Food and Nutrition  Tobacco Use   Smoking status: Never   Smokeless tobacco: Never  Vaping Use   Vaping status: Never Used  Substance and Sexual Activity   Alcohol use: Yes   Drug use: Yes    Types: Marijuana   Sexual activity: Yes    Birth control/protection: Pill  Other Topics Concern   Not on file  Social History Narrative   Lives at home with her husband.   Right-handed.   2-4 cups caffeine per day.   Mgr at  [employer]   Social Drivers of Health   Financial Resource Strain: High Risk (08/05/2022)   Overall Financial Resource Strain (CARDIA)    Difficulty of Paying Living Expenses: Hard  Food Insecurity: Food Insecurity Present (08/05/2022)   Hunger Vital Sign    Worried About Running Out of Food in the Last Year: Sometimes true    Ran Out of Food in the Last Year: Sometimes true  Transportation Needs: No Transportation Needs (08/05/2022)   PRAPARE - Administrator, Civil Service (Medical): No    Lack of Transportation (Non-Medical): No  Physical Activity: Sufficiently Active (08/05/2022)   Exercise Vital Sign    Days of Exercise per Week: 6 days    Minutes of Exercise per Session: 90  min  Stress: Stress Concern Present (08/05/2022)   [employer] of Occupational Health - Occupational Stress Questionnaire    Feeling of Stress : To some extent  Social Connections: Moderately Isolated (08/05/2022)   Social Connection and Isolation Panel [NHANES]    Frequency of Communication with Friends and Family: More than three times a week    Frequency of Social Gatherings with Friends and Family: Once a week    Attends Religious Services: 1 to 4 times per year    Active Member of [employer] or Organizations: No    Attends Engineer, structural: Not on file    Marital Status: Separated  Intimate Partner Violence: Not on file   Family History  Problem Relation Age of Onset   Hypertension Mother    Endometrial cancer Mother 63       MSH2+ Lynch syndrome   Seizures Father    Sleep apnea Father    Other Sister        MSH2+   Seizures Paternal Aunt        x 3 aunts   Breast cancer Maternal Grandmother 35   Colon cancer Maternal Grandfather 34   Prostate cancer Maternal Grandfather    Breast cancer Cousin 56   Anesthesia problems Neg Hx    Allergies  Allergen Reactions   Latex Hives   Penicillins Anaphylaxis, Itching and Swelling   Tomato Hives and Swelling   Iodine Hives   Shellfish Allergy Hives    PE Today's Vitals   10/11/23 1345  BP: 122/74  Pulse: 74  SpO2: 98%  Weight: 252 lb (114.3 kg)  Height: 5' 5.5" (1.664 m)   Body mass index is 41.3 kg/m.  Physical Exam Vitals reviewed. Exam conducted with a chaperone present.  Constitutional:      General: She is not in acute distress.    Appearance: Normal appearance.  HENT:     Head: Normocephalic and atraumatic.     Nose: Nose normal.  Eyes:     Extraocular Movements: Extraocular movements intact.     Conjunctiva/sclera: Conjunctivae normal.  Neck:     Thyroid: No thyroid mass, thyromegaly or thyroid tenderness.  Pulmonary:     Effort: Pulmonary effort is normal.  Chest:     Chest wall: No mass  or tenderness.  Breasts:    Right: Normal. No swelling, mass, nipple discharge, skin change or tenderness.     Left: Normal. No swelling, mass, nipple discharge, skin change or tenderness.  Abdominal:     General: There is no distension.     Palpations: Abdomen is soft.     Tenderness: There is no abdominal tenderness.  Genitourinary:    General: Normal vulva.     Exam position:  Lithotomy position.     Urethra: No prolapse.     Vagina: Normal. No vaginal discharge or bleeding.     Cervix: Normal. No lesion.     Uterus: Normal. Not enlarged and not tender.      Adnexa: Right adnexa normal and left adnexa normal.  Musculoskeletal:        General: Normal range of motion.     Cervical back: Normal range of motion.  Lymphadenopathy:     Upper Body:     Right upper body: No axillary adenopathy.     Left upper body: No axillary adenopathy.     Lower Body: No right inguinal adenopathy. No left inguinal adenopathy.  Skin:    General: Skin is warm and dry.  Neurological:     General: No focal deficit present.     Mental Status: She is alert.  Psychiatric:        Mood and Affect: Mood normal.        Behavior: Behavior normal.      Assessment and Plan:        Well woman exam with routine gynecological exam Assessment & Plan: Cervical cancer screening performed according to ASCCP guidelines. Encouraged annual mammogram screening at 33yo, no indication for earlier screening Colonoscopy due, referral placed Labs and immunizations with her primary Encouraged safe sexual practices as indicated Encouraged healthy lifestyle practices with diet and exercise For patients under 50yo, I recommend 1000mg  calcium daily and 600IU of vitamin D daily.  Cervical cancer screening -     Cytology - PAP  Oral contraceptive pill surveillance Continue Junel , plan to increase to 35mcg pill after EMB  Negative depression screening  MSH2-related Lynch syndrome (HNPCC1) Assessment & Plan: Reviewed  the following genetics recommendations with patient. Follow-up with PCP for reminder of recommendations  Colorectal Cancer Screening: High quality colonoscopy at age 38-35 or 2-5 years prior to the earliest colon cancer if it is diagnosed before age 15 and repeat every 1-2 years Consider using daily aspirin to reduce the risk of colorectal cancer.    Endometrial Cancer Screening/Risk Reduction: Women should report any abnormal uterine bleeding or postmenopausal bleeding. The evaluation of these symptoms should include an endometrial biopsy. A hysterectomy may be considered. The timing should be individualized based on whether childbearing is complete, comorbidities, and family history. Endometrial cancer screening does not have a proven benefit in women with Lynch Syndrome. However, endometrial biopsy is highly sensitive and specific as a diagnostic procedure. Screening via endometrial biopsy every 1-2 years starting at age 6-35 can be considered.   Ovarian Cancer Screening/Risk Reduction: A prophylactic bilateral salpingo-oophorectomy (BSO), or having the ovaries and fallopian tubes removed, may be considered. A BSO is estimated to reduce the risk of ovarian cancer by up to 96%. Timing of a BSO should be individualized based on whether childbearing is complete, menopause status, comorbidities, and family history. Current data does not support routine ovarian screening for Lynch syndrome, therefore it may be considered at the clinician's discretion.   Colon cancer screening -     Ambulatory referral to Gastroenterology  Abnormal uterine bleeding (AUB) -     US  PELVIS TRANSVAGINAL NON-OB (TV ONLY); Future -     Endometrial biopsy; Future  Need for hepatitis C screening test -     Hepatitis C antibody  Annual physical exam -     TSH -     Lipid panel -     Hemoglobin A1c -     Comprehensive metabolic  panel with GFR -     CBC with Differential/Platelet  Labs only for last 2 dx codes,  ordered by PCP  Romaine Closs, MD

## 2023-10-11 NOTE — Patient Instructions (Addendum)
 Take daily aspirin 81mg  to reduce risk of colon cancer.  Health Maintenance, Female Adopting a healthy lifestyle and getting preventive care are important in promoting health and wellness. Ask your health care provider about: The right schedule for you to have regular tests and exams. Things you can do on your own to prevent diseases and keep yourself healthy. What should I know about diet, weight, and exercise? Eat a healthy diet  Eat a diet that includes plenty of vegetables, fruits, low-fat dairy products, and lean protein. Do not eat a lot of foods that are high in solid fats, added sugars, or sodium. Maintain a healthy weight Body mass index (BMI) is used to identify weight problems. It estimates body fat based on height and weight. Your health care provider can help determine your BMI and help you achieve or maintain a healthy weight. Get regular exercise Get regular exercise. This is one of the most important things you can do for your health. Most adults should: Exercise for at least 150 minutes each week. The exercise should increase your heart rate and make you sweat (moderate-intensity exercise). Do strengthening exercises at least twice a week. This is in addition to the moderate-intensity exercise. Spend less time sitting. Even light physical activity can be beneficial. Watch cholesterol and blood lipids Have your blood tested for lipids and cholesterol at 31 years of age, then have this test every 5 years. Have your cholesterol levels checked more often if: Your lipid or cholesterol levels are high. You are older than 31 years of age. You are at high risk for heart disease. What should I know about cancer screening? Depending on your health history and family history, you may need to have cancer screening at various ages. This may include screening for: Breast cancer. Cervical cancer. Colorectal cancer. Skin cancer. Lung cancer. What should I know about heart disease,  diabetes, and high blood pressure? Blood pressure and heart disease High blood pressure causes heart disease and increases the risk of stroke. This is more likely to develop in people who have high blood pressure readings or are overweight. Have your blood pressure checked: Every 3-5 years if you are 31-46 years of age. Every year if you are 31 years old or older. Diabetes Have regular diabetes screenings. This checks your fasting blood sugar level. Have the screening done: Once every three years after age 31 if you are at a normal weight and have a low risk for diabetes. More often and at a younger age if you are overweight or have a high risk for diabetes. What should I know about preventing infection? Hepatitis B If you have a higher risk for hepatitis B, you should be screened for this virus. Talk with your health care provider to find out if you are at risk for hepatitis B infection. Hepatitis C Testing is recommended for: Everyone born from [birth year] through 1965. Anyone with known risk factors for hepatitis C. Sexually transmitted infections (STIs) Get screened for STIs, including gonorrhea and chlamydia, if: You are sexually active and are younger than 31 years of age. You are older than 31 years of age and your health care provider tells you that you are at risk for this type of infection. Your sexual activity has changed since you were last screened, and you are at increased risk for chlamydia or gonorrhea. Ask your health care provider if you are at risk. Ask your health care provider about whether you are at high risk for HIV. Your health  care provider may recommend a prescription medicine to help prevent HIV infection. If you choose to take medicine to prevent HIV, you should first get tested for HIV. You should then be tested every 3 months for as long as you are taking the medicine. Pregnancy If you are about to stop having your period (premenopausal) and you may become pregnant,  seek counseling before you get pregnant. Take 400 to 800 micrograms (mcg) of folic acid every day if you become pregnant. Ask for birth control (contraception) if you want to prevent pregnancy. Osteoporosis and menopause Osteoporosis is a disease in which the bones lose minerals and strength with aging. This can result in bone fractures. If you are 31 years old or older, or if you are at risk for osteoporosis and fractures, ask your health care provider if you should: Be screened for bone loss. Take a calcium or vitamin D supplement to lower your risk of fractures. Be given hormone replacement therapy (HRT) to treat symptoms of menopause. Follow these instructions at home: Alcohol use Do not drink alcohol if: Your health care provider tells you not to drink. You are pregnant, may be pregnant, or are planning to become pregnant. If you drink alcohol: Limit how much you have to: 0-1 drink a day. Know how much alcohol is in your drink. In the [place] one drink equals one 12 oz bottle of beer (355 mL), one 5 oz glass of wine (148 mL), or one 1 oz glass of hard liquor (44 mL). Lifestyle Do not use any products that contain nicotine or tobacco. These products include cigarettes, chewing tobacco, and vaping devices, such as e-cigarettes. If you need help quitting, ask your health care provider. Do not use street drugs. Do not share needles. Ask your health care provider for help if you need support or information about quitting drugs. General instructions Schedule regular health, dental, and eye exams. Stay current with your vaccines. Tell your health care provider if: You often feel depressed. You have ever been abused or do not feel safe at home. Summary Adopting a healthy lifestyle and getting preventive care are important in promoting health and wellness. Follow your health care provider's instructions about healthy diet, exercising, and getting tested or screened for diseases. Follow your  health care provider's instructions on monitoring your cholesterol and blood pressure. This information is not intended to replace advice given to you by your health care provider. Make sure you discuss any questions you have with your health care provider. Document Revised: 09/14/2020 Document Reviewed: 09/14/2020 Elsevier Patient Education  2024 [employer].

## 2023-10-11 NOTE — Assessment & Plan Note (Addendum)
 Reviewed the following genetics recommendations with patient. Follow-up with PCP for reminder of recommendations  Colorectal Cancer Screening: High quality colonoscopy at age 31-35 or 2-5 years prior to the earliest colon cancer if it is diagnosed before age 31 and repeat every 1-2 years Consider using daily aspirin to reduce the risk of colorectal cancer.    Endometrial Cancer Screening/Risk Reduction: Women should report any abnormal uterine bleeding or postmenopausal bleeding. The evaluation of these symptoms should include an endometrial biopsy. A hysterectomy may be considered. The timing should be individualized based on whether childbearing is complete, comorbidities, and family history. Endometrial cancer screening does not have a proven benefit in women with Lynch Syndrome. However, endometrial biopsy is highly sensitive and specific as a diagnostic procedure. Screening via endometrial biopsy every 1-2 years starting at age 58-35 can be considered.   Ovarian Cancer Screening/Risk Reduction: A prophylactic bilateral salpingo-oophorectomy (BSO), or having the ovaries and fallopian tubes removed, may be considered. A BSO is estimated to reduce the risk of ovarian cancer by up to 96%. Timing of a BSO should be individualized based on whether childbearing is complete, menopause status, comorbidities, and family history. Current data does not support routine ovarian screening for Lynch syndrome, therefore it may be considered at the clinician's discretion.

## 2023-10-12 ENCOUNTER — Other Ambulatory Visit: Payer: Self-pay

## 2023-10-12 DIAGNOSIS — Z0000 Encounter for general adult medical examination without abnormal findings: Secondary | ICD-10-CM

## 2023-10-12 LAB — HEPATITIS C ANTIBODY: Hepatitis C Ab: NONREACTIVE

## 2023-10-13 LAB — CYTOLOGY - PAP
Comment: NEGATIVE
Diagnosis: NEGATIVE
High risk HPV: NEGATIVE

## 2023-10-16 ENCOUNTER — Ambulatory Visit: Payer: Self-pay | Admitting: Obstetrics and Gynecology

## 2023-10-23 ENCOUNTER — Other Ambulatory Visit: Payer: Self-pay

## 2023-10-23 ENCOUNTER — Telehealth: Payer: Self-pay | Admitting: *Deleted

## 2023-10-23 NOTE — Telephone Encounter (Signed)
 Spoke with patient, advised per Dr. Andrena Ke.   Patient states she is not taking OCP. ASked patient to clarify, previous conversation she told me she was on OCP.   Patient states she is not taking right now, has a prescription. Patient states she starts the pill, stops when her bleeding starts and then restarts when bleeding stops. Patient states she has been off of OCP since she started bleeding on 10/13/23. States this is how she has been taking them, does not follow the days in the pill pack, just restarts when bleeding stops, days may vary. Patient states she was not aware she should resume active pills after 7 days.   Advised I will give this update to Dr. Andrena Ke and return call with further instructions. Patient agreeable.   Dr. Andrena Ke -please review and advise if any different recommendations.

## 2023-10-23 NOTE — Telephone Encounter (Signed)
 Call returned to patient.  Patient reports menses started 10/13/23, has not stopped. Changing menses cup q 4-5 hrs, cup is full. OCP for contraceptive, no missed or late pills. Denies pain, N/V, fever/chills, s/s of anemia. Reviewed PAP results with patient, denies any s/s of BV.   Patient is scheduled for PUS and EMB on 11/02/23, will also be repeating labs.   Bleeding precautions reviewed with patient. Advised I will send to Dr. Andrena Ke for final review, our office will f/u if any additional recommendations. Patient agreeable.   Routing to Dr. Andrena Ke

## 2023-10-24 NOTE — Telephone Encounter (Signed)
 Spoke with patient, advised per Dr. Andrena Ke.  Patient read back instructions. States she has an Psychologist, forensic at [place], will pick up. Advised patient if she has any additional questions please contact the office. Patient verbalizes understanding.   Encounter closed.

## 2023-10-26 ENCOUNTER — Other Ambulatory Visit: Payer: Self-pay | Admitting: Physician Assistant

## 2023-11-02 ENCOUNTER — Other Ambulatory Visit: Payer: Self-pay | Admitting: Obstetrics and Gynecology

## 2023-11-02 ENCOUNTER — Other Ambulatory Visit (HOSPITAL_COMMUNITY)
Admission: RE | Admit: 2023-11-02 | Discharge: 2023-11-02 | Disposition: A | Discharge status: Home / Self Care | Source: Ambulatory Visit | Attending: Obstetrics and Gynecology | Admitting: Obstetrics and Gynecology

## 2023-11-02 ENCOUNTER — Ambulatory Visit (INDEPENDENT_AMBULATORY_CARE_PROVIDER_SITE_OTHER): Payer: Self-pay

## 2023-11-02 ENCOUNTER — Ambulatory Visit (INDEPENDENT_AMBULATORY_CARE_PROVIDER_SITE_OTHER): Payer: Self-pay | Admitting: Obstetrics and Gynecology

## 2023-11-02 ENCOUNTER — Encounter: Payer: Self-pay | Admitting: Obstetrics and Gynecology

## 2023-11-02 VITALS — BP 100/58 | HR 70 | Temp 98.3°F | Wt 260.0 lb

## 2023-11-02 DIAGNOSIS — Z1211 Encounter for screening for malignant neoplasm of colon: Secondary | ICD-10-CM

## 2023-11-02 DIAGNOSIS — N939 Abnormal uterine and vaginal bleeding, unspecified: Secondary | ICD-10-CM

## 2023-11-02 DIAGNOSIS — Z1331 Encounter for screening for depression: Secondary | ICD-10-CM

## 2023-11-02 DIAGNOSIS — Z1509 Genetic susceptibility to other malignant neoplasm: Secondary | ICD-10-CM

## 2023-11-02 DIAGNOSIS — Z0000 Encounter for general adult medical examination without abnormal findings: Secondary | ICD-10-CM

## 2023-11-02 DIAGNOSIS — Z01419 Encounter for gynecological examination (general) (routine) without abnormal findings: Secondary | ICD-10-CM

## 2023-11-02 DIAGNOSIS — Z1159 Encounter for screening for other viral diseases: Secondary | ICD-10-CM

## 2023-11-02 DIAGNOSIS — Z01812 Encounter for preprocedural laboratory examination: Secondary | ICD-10-CM

## 2023-11-02 DIAGNOSIS — Z3041 Encounter for surveillance of contraceptive pills: Secondary | ICD-10-CM

## 2023-11-02 DIAGNOSIS — Z124 Encounter for screening for malignant neoplasm of cervix: Secondary | ICD-10-CM

## 2023-11-02 LAB — PREGNANCY, URINE: Preg Test, Ur: NEGATIVE

## 2023-11-02 MED ORDER — NORETHINDRONE ACET-ETHINYL EST 1.5-30 MG-MCG PO TABS: 1.0000 | ORAL_TABLET | Freq: Every day | ORAL | 3 refills | Status: AC

## 2023-11-02 NOTE — Assessment & Plan Note (Signed)
 Reviewed the following genetics recommendations with patient. Follow-up with PCP for reminder of recommendations  Colorectal Cancer Screening: High quality colonoscopy at age 31-35 or 2-5 years prior to the earliest colon cancer if it is diagnosed before age 10 and repeat every 1-2 years Consider using daily aspirin to reduce the risk of colorectal cancer.  GI referral sent at annual exam 2025 given fm hx   Endometrial Cancer Screening/Risk Reduction: A hysterectomy may be considered. The timing should be individualized based on whether childbearing is complete, comorbidities, and family history. Endometrial cancer screening does not have a proven benefit in women with Lynch Syndrome. However, endometrial biopsy is highly sensitive and specific as a diagnostic procedure. Screening via endometrial biopsy every 1-2 years starting at age 53-35 can be considered. Uncx EMB today   Ovarian Cancer Screening/Risk Reduction: A prophylactic bilateral salpingo-oophorectomy (BSO), or having the ovaries and fallopian tubes removed, may be considered. A BSO is estimated to reduce the risk of ovarian cancer by up to 96%. Timing of a BSO should be individualized based on whether childbearing is complete, menopause status, comorbidities, and family history. Current data does not support routine ovarian screening for Lynch syndrome, therefore it may be considered at the clinician's discretion.

## 2023-11-02 NOTE — Assessment & Plan Note (Signed)
 will increase to 30 mcg Junel  COC today. RTO in 3 months

## 2023-11-02 NOTE — Progress Notes (Signed)
 33 y.o. 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 Divorced. Children with father in [facility] visiting for the summer.  Patient's last menstrual period was 10/20/2023.   She reports started COC 1 year ago for contraception. Cycles have been poorly controlled- irregular and prolonged. Has not been SA since divorce.  Abnormal bleeding: as noted Pelvic discharge or pain: none  Sexually active: yes  Birth control: COC Last PAP:     Component Value Date/Time   DIAGPAP  10/11/2023 1434    - Negative for intraepithelial lesion or malignancy (NILM)   HPVHIGH Negative 10/11/2023 1434   ADEQPAP  10/11/2023 1434    Satisfactory for evaluation; transformation zone component PRESENT.   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     Multiple Vitamin (MULTIVITAMIN) tablet Take 1 tablet by mouth daily.      No current facility-administered medications on file prior to visit.    Allergies  Allergen Reactions   Latex Hives   Penicillins Anaphylaxis, Itching and Swelling   Tomato Hives and Swelling   Iodine Hives   Shellfish Allergy Hives    PE Today's Vitals   11/02/23 1523  BP: (!) 100/58  Pulse: 70  Temp: 98.3 F (36.8 C)  TempSrc: Oral  SpO2: 99%  Weight: 260 lb (117.9 kg)   Body mass index is 42.61 kg/m.  Physical Exam Vitals reviewed. Exam conducted with a chaperone present.  Constitutional:      General: She is not in acute distress.    Appearance: Normal appearance.  HENT:     Head: Normocephalic and atraumatic.     Nose: Nose normal.   Eyes:     Extraocular Movements: Extraocular movements intact.     Conjunctiva/sclera: Conjunctivae normal.   Pulmonary:     Effort: Pulmonary effort is normal.  Genitourinary:    General: Normal vulva.     Exam position: Lithotomy position.     Vagina: Normal. No vaginal discharge.     Cervix: Normal. No cervical motion tenderness, discharge or lesion.     Uterus: Normal. Not enlarged and not tender.      Adnexa: Right adnexa normal and  left adnexa normal.   Musculoskeletal:        General: Normal range of motion.     Cervical back: Normal range of motion.   Neurological:     General: No focal deficit present.     Mental Status: She is alert.   Psychiatric:        Mood and Affect: Mood normal.        Behavior: Behavior normal.      11/02/23 TVUS: Indications: Abnormal uterine bleeding, Lynch syndrome  Findings:   Uterus: 9.0 x 5.5 x 4.8 cm, normal-appearing. Endometrial thickness: 4 mm. Left ovary: 2.9 x 1.9 x 1.7 cm, normal-appearing. Right ovary: 2.8 x 1.8 x 1.7 cm, normal-appearing. No free fluid.  Impression:  Normal transvaginal ultrasound.  Vera LULLA Pa, MD   Procedure Consent was signed. Timeout was performed. Speculum inserted into the vagina, cervix visualized and was prepped with  Betadine.  Cervical block was declined. A single-toothed tenaculum was placed on the anterior lip of the cervix to stabilize it.  The 3 mm pipelle was introduced into the endometrial cavity without difficulty to a depth of 8 cm, suction initiated and a minimal Amount of tissue was obtained and sent to pathology.  The instruments were removed from the patient's vagina.  Minimal bleeding from the cervix was noted.  The patient tolerated the procedure well.   Assessment and Plan:        Pre-procedure lab exam -     Pregnancy, urine  MSH2-related Lynch syndrome (HNPCC1) Assessment & Plan: Reviewed the following genetics recommendations with patient. Follow-up with PCP for reminder of recommendations  Colorectal Cancer Screening: High quality colonoscopy at age 66-35 or 2-5 years prior to the earliest colon cancer if it is diagnosed before age 63 and repeat every 1-2 years Consider using daily aspirin to reduce the risk of colorectal cancer.  GI referral sent at annual exam 2025 given fm hx   Endometrial Cancer Screening/Risk Reduction: A hysterectomy may be considered. The timing should be individualized based on whether childbearing is complete, comorbidities, and family history. Endometrial cancer screening does not have a proven benefit in women with Lynch Syndrome. However, endometrial biopsy is highly sensitive and specific as a diagnostic procedure. Screening via endometrial biopsy every 1-2 years starting at age 61-35 can be considered. Uncx EMB today   Ovarian Cancer Screening/Risk Reduction: A prophylactic bilateral salpingo-oophorectomy (BSO), or having the ovaries and fallopian tubes removed, may be considered. A BSO is estimated to reduce the risk of ovarian cancer by up to 96%. Timing of a BSO should be individualized based on whether childbearing is complete, menopause status, comorbidities, and family history. Current data does not support routine ovarian screening for Lynch  syndrome, therefore it may be considered at the clinician's discretion.   Orders: -     Surgical pathology  Abnormal uterine bleeding (AUB) Assessment & Plan: will increase to 30 mcg Junel  COC today. RTO in 3 months  Orders: -     Surgical pathology -     Norethindrone Acet-Ethinyl Est; Take 1 tablet by mouth daily.  Dispense: 84 tablet; Refill: 3  Annual physical exam -     CBC with Differential/Platelet -     COMPLETE METABOLIC PANEL WITHOUT GFR -     Hemoglobin A1c -     Lipid panel -     TSH     Vera LULLA Pa, MD

## 2023-11-03 ENCOUNTER — Ambulatory Visit: Payer: Self-pay | Admitting: Obstetrics and Gynecology

## 2023-11-03 LAB — CBC WITH DIFFERENTIAL/PLATELET
Absolute Lymphocytes: 3381 {cells}/uL (ref 850–3900)
Absolute Monocytes: 462 {cells}/uL (ref 200–950)
Basophils Absolute: 53 {cells}/uL (ref 0–200)
Basophils Relative: 0.5 %
Eosinophils Absolute: 294 {cells}/uL (ref 15–500)
Eosinophils Relative: 2.8 %
HCT: 34.4 % — ABNORMAL LOW (ref 35.0–45.0)
Hemoglobin: 10.8 g/dL — ABNORMAL LOW (ref 11.7–15.5)
MCH: 27.5 pg (ref 27.0–33.0)
MCHC: 31.4 g/dL — ABNORMAL LOW (ref 32.0–36.0)
MCV: 87.5 fL (ref 80.0–100.0)
MPV: 10.1 fL (ref 7.5–12.5)
Monocytes Relative: 4.4 %
Neutro Abs: 6311 {cells}/uL (ref 1500–7800)
Neutrophils Relative %: 60.1 %
Platelets: 266 10*3/uL (ref 140–400)
RBC: 3.93 10*6/uL (ref 3.80–5.10)
RDW: 11.8 % (ref 11.0–15.0)
Total Lymphocyte: 32.2 %
WBC: 10.5 10*3/uL (ref 3.8–10.8)

## 2023-11-03 LAB — COMPLETE METABOLIC PANEL WITHOUT GFR
AG Ratio: 1.4 (calc) (ref 1.0–2.5)
ALT: 17 U/L (ref 6–29)
AST: 18 U/L (ref 10–30)
Albumin: 4 g/dL (ref 3.6–5.1)
Alkaline phosphatase (APISO): 55 U/L (ref 31–125)
BUN: 13 mg/dL (ref 7–25)
CO2: 24 mmol/L (ref 20–32)
Calcium: 8.7 mg/dL (ref 8.6–10.2)
Chloride: 104 mmol/L (ref 98–110)
Creat: 0.66 mg/dL (ref 0.50–0.97)
Globulin: 2.9 g/dL (ref 1.9–3.7)
Glucose, Bld: 77 mg/dL (ref 65–99)
Potassium: 3.8 mmol/L (ref 3.5–5.3)
Sodium: 138 mmol/L (ref 135–146)
Total Bilirubin: 0.3 mg/dL (ref 0.2–1.2)
Total Protein: 6.9 g/dL (ref 6.1–8.1)

## 2023-11-03 LAB — HEMOGLOBIN A1C
Hgb A1c MFr Bld: 5.2 % (ref <5.7)
Mean Plasma Glucose: 103 mg/dL
eAG (mmol/L): 5.7 mmol/L

## 2023-11-03 LAB — LIPID PANEL
Cholesterol: 169 mg/dL (ref <200)
HDL: 61 mg/dL (ref >=50)
LDL Cholesterol (Calc): 94 mg/dL
Non-HDL Cholesterol (Calc): 108 mg/dL (ref <130)
Total CHOL/HDL Ratio: 2.8 (calc) (ref <5.0)
Triglycerides: 62 mg/dL (ref <150)

## 2023-11-03 LAB — TSH: TSH: 1.53 m[IU]/L

## 2023-11-07 LAB — SURGICAL PATHOLOGY

## 2023-12-01 ENCOUNTER — Encounter: Payer: Self-pay | Admitting: Obstetrics and Gynecology

## 2023-12-02 ENCOUNTER — Encounter: Payer: Self-pay | Admitting: Obstetrics and Gynecology
